# Patient Record
Sex: Male | Born: 1935 | Race: White | Hispanic: No | Marital: Married | Smoking: Former smoker
Health system: Southern US, Community
[De-identification: ages and names within clinical notes are randomized; demographics above are authoritative.]

## PROBLEM LIST (undated history)

## (undated) DIAGNOSIS — I340 Nonrheumatic mitral (valve) insufficiency: Secondary | ICD-10-CM

## (undated) DIAGNOSIS — K219 Gastro-esophageal reflux disease without esophagitis: Secondary | ICD-10-CM

## (undated) DIAGNOSIS — E785 Hyperlipidemia, unspecified: Secondary | ICD-10-CM

## (undated) DIAGNOSIS — I2510 Atherosclerotic heart disease of native coronary artery without angina pectoris: Secondary | ICD-10-CM

## (undated) DIAGNOSIS — J449 Chronic obstructive pulmonary disease, unspecified: Secondary | ICD-10-CM

## (undated) DIAGNOSIS — I10 Essential (primary) hypertension: Secondary | ICD-10-CM

## (undated) DIAGNOSIS — Q859 Phakomatosis, unspecified: Secondary | ICD-10-CM

## (undated) DIAGNOSIS — G4730 Sleep apnea, unspecified: Secondary | ICD-10-CM

## (undated) DIAGNOSIS — I951 Orthostatic hypotension: Secondary | ICD-10-CM

## (undated) DIAGNOSIS — M109 Gout, unspecified: Secondary | ICD-10-CM

## (undated) DIAGNOSIS — D649 Anemia, unspecified: Secondary | ICD-10-CM

## (undated) HISTORY — PX: VASECTOMY: SHX75

## (undated) HISTORY — DX: Atherosclerotic heart disease of native coronary artery without angina pectoris: I25.10

## (undated) HISTORY — DX: Phakomatosis, unspecified: Q85.9

## (undated) HISTORY — DX: Orthostatic hypotension: I95.1

## (undated) HISTORY — DX: Hyperlipidemia, unspecified: E78.5

## (undated) HISTORY — DX: Essential (primary) hypertension: I10

## (undated) HISTORY — DX: Anemia, unspecified: D64.9

## (undated) HISTORY — DX: Chronic obstructive pulmonary disease, unspecified: J44.9

## (undated) HISTORY — DX: Nonrheumatic mitral (valve) insufficiency: I34.0

---
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
## 1972-07-16 HISTORY — PX: LUMBAR DISC SURGERY: SHX700

## 1979-03-17 HISTORY — PX: TRIGGER FINGER RELEASE: SHX641

## 2006-07-16 HISTORY — PX: CARDIAC CATHETERIZATION: SHX172

## 2006-11-27 ENCOUNTER — Ambulatory Visit: Payer: Self-pay | Admitting: Cardiology

## 2006-12-11 ENCOUNTER — Ambulatory Visit: Payer: Self-pay | Admitting: Cardiology

## 2007-04-15 ENCOUNTER — Ambulatory Visit: Payer: Self-pay | Admitting: Cardiology

## 2007-04-23 ENCOUNTER — Ambulatory Visit: Payer: Self-pay | Admitting: Cardiology

## 2007-05-23 ENCOUNTER — Ambulatory Visit: Payer: Self-pay | Admitting: Cardiology

## 2007-06-02 ENCOUNTER — Ambulatory Visit: Payer: Self-pay | Admitting: Cardiology

## 2007-06-02 ENCOUNTER — Inpatient Hospital Stay (HOSPITAL_BASED_OUTPATIENT_CLINIC_OR_DEPARTMENT_OTHER): Admission: RE | Admit: 2007-06-02 | Discharge: 2007-06-02 | Payer: Self-pay | Admitting: Cardiology

## 2007-06-19 ENCOUNTER — Encounter: Payer: Self-pay | Admitting: Physician Assistant

## 2007-06-19 ENCOUNTER — Ambulatory Visit: Payer: Self-pay | Admitting: Cardiology

## 2007-09-11 ENCOUNTER — Ambulatory Visit: Payer: Self-pay | Admitting: Cardiology

## 2007-11-19 ENCOUNTER — Ambulatory Visit: Payer: Self-pay | Admitting: Cardiology

## 2008-06-15 ENCOUNTER — Encounter: Payer: Self-pay | Admitting: Cardiology

## 2008-06-23 DIAGNOSIS — I10 Essential (primary) hypertension: Secondary | ICD-10-CM

## 2008-06-23 DIAGNOSIS — R609 Edema, unspecified: Secondary | ICD-10-CM

## 2008-06-23 DIAGNOSIS — E785 Hyperlipidemia, unspecified: Secondary | ICD-10-CM | POA: Insufficient documentation

## 2008-10-16 ENCOUNTER — Encounter: Payer: Self-pay | Admitting: Cardiology

## 2009-02-28 ENCOUNTER — Encounter: Payer: Self-pay | Admitting: Cardiology

## 2009-02-28 ENCOUNTER — Ambulatory Visit: Payer: Self-pay | Admitting: Cardiology

## 2009-02-28 DIAGNOSIS — N182 Chronic kidney disease, stage 2 (mild): Secondary | ICD-10-CM

## 2009-02-28 DIAGNOSIS — G20 Parkinson's disease: Secondary | ICD-10-CM

## 2009-02-28 DIAGNOSIS — E739 Lactose intolerance, unspecified: Secondary | ICD-10-CM

## 2009-03-02 ENCOUNTER — Encounter: Payer: Self-pay | Admitting: Cardiology

## 2009-03-10 ENCOUNTER — Encounter (INDEPENDENT_AMBULATORY_CARE_PROVIDER_SITE_OTHER): Payer: Self-pay | Admitting: *Deleted

## 2009-08-10 ENCOUNTER — Encounter: Payer: Self-pay | Admitting: Cardiology

## 2009-09-21 ENCOUNTER — Ambulatory Visit: Payer: Self-pay | Admitting: Cardiology

## 2009-09-27 ENCOUNTER — Ambulatory Visit: Payer: Self-pay | Admitting: Cardiology

## 2009-10-05 ENCOUNTER — Encounter (INDEPENDENT_AMBULATORY_CARE_PROVIDER_SITE_OTHER): Payer: Self-pay | Admitting: *Deleted

## 2010-08-15 NOTE — Assessment & Plan Note (Signed)
Summary: 6 mo fu per feb reminder  Medications Added FISH OIL 1000 MG CAPS (OMEGA-3 FATTY ACIDS) Take 2 tablet by mouth two times a day METOPROLOL TARTRATE 25 MG TABS (METOPROLOL TARTRATE) Take 1 tablet by mouth two times a day CARBIDOPA-LEVODOPA 25-100 MG TABS (CARBIDOPA-LEVODOPA) Take 1 tablet by mouth in morning and 1/2 at midday      Allergies Added:   Visit Type:  Follow-up Primary Provider:  Dr. Margo Common  CC:  follow-up visit.  History of Present Illness: the patient is a 77 year old male with a history of Parkinson's disease.  The patient has known coronary artery disease with single-vessel disease of the LAD with hundred percent occlusion but with collaterals provided by the circumflex and right coronary artery.  The patient has normal LV function.  The patient reports occasional atypical left-sided chest pain which typically lasts less than one minute.  He reports however no exertional chest pain which is substernal in nature.  He denies any orthopnea PND or shortness of breath on exertion.  He has mild renal insufficiency his creatinine has been stable.  He also had a recent abdominal ultrasound done which showed no evidence of aortic aneurysm.  The patient reports no palpitations or syncope.  The patient stated he did not have any recent lipid panel or LFTs drawn.  He also had no recent ischemia evaluation.  Clinical Review Panels:  CXR CXR results Heart size is normal. Mediastinum is unremarkable. The lungs are         hyperinflated consistent with emphysema. There is pleural and         parenchymal scarring at the apices. No evidence of active infiltrate,         mass, effusion or collapse.                   IMPRESSION:         EMPHYSEMA. NO ACTIVE PROCESS EVIDENT. (05/23/2007)  Cardiac Imaging Cardiac Cath Findings  1. Coronary artery disease as outlined including a chronically       occluded proximal left anterior descending with some evidence of       partial  filling of the mid to distal vessel via right-to-left       collaterals.  There is otherwise mild to moderate coronary       atherosclerosis within the circumflex and right coronary artery.       There is a medium caliber ramus intermedius without significant       obstruction.   2. Left ventricular end-diastolic pressure of 10 mmHg.      DISCUSSION:  I reviewed the results with the patient and his family.  I   also discussed the case with Dr. Andee Lineman by phone.  At this point would   anticipate medical therapy and office follow-up.      Jonelle Sidle, MD (06/02/2007)    Preventive Screening-Counseling & Management  Alcohol-Tobacco     Smoking Status: quit     Year Quit: 1983  Current Medications (verified): 1)  Nitroglycerin 0.4 Mg Subl (Nitroglycerin) .... One Tablet Under Tongue Every 5 Minutes As Needed For Chest Pain---May Repeat Times Three 2)  Fish Oil 1000 Mg Caps (Omega-3 Fatty Acids) .... Take 2 Tablet By Mouth Two Times A Day 3)  Aspirin 81 Mg Tbec (Aspirin) .... Take One Tablet By Mouth Daily 4)  Doxazosin Mesylate 2 Mg Tabs (Doxazosin Mesylate) .... Once Daily 5)  Metoprolol Tartrate 25 Mg Tabs (Metoprolol Tartrate) .... Take  1 Tablet By Mouth Two Times A Day 6)  Requip 5 Mg Tabs (Ropinirole Hcl) .... Three Times A Day 7)  Simvastatin 40 Mg Tabs (Simvastatin) .... Take 1 Tab Daily 8)  Carbidopa-Levodopa 25-100 Mg Tabs (Carbidopa-Levodopa) .... Take 1 Tablet By Mouth in Morning and 1/2 At Midday  Allergies (verified): 1)  ! Sulfa  Comments:  Nurse/Medical Assistant: The patient's medications and allergies were reviewed with the patient and were updated in the Medication and Allergy Lists. Bottles reviewed.  Past History:  Past Medical History: Last updated: 06/23/2008 CAD-Chronic 100% proximal LAD with distal collateralization, treated medically, Residual moderate CFX and RCA disease Hyperlipidemia Hypertension Parkinson's Disease Mild mitral  regurgitation  Past Surgical History: Last updated: 06/23/2008 Tonsillectomy Bilateral Hand Surgeries Vasectomy  Family History: Last updated: 06/23/2008 Father:CAD, Alzheimer's Mother: Ovarian Cancer Family History of Diabetes, TIA  Social History: Last updated: 06/23/2008 Tobacco Use - Former.  Alcohol Use - no Drug Use - no  Risk Factors: Smoking Status: quit (09/21/2009)  Social History: Smoking Status:  quit  Review of Systems  The patient denies fatigue, malaise, fever, weight gain/loss, vision loss, decreased hearing, hoarseness, chest pain, palpitations, shortness of breath, prolonged cough, wheezing, sleep apnea, coughing up blood, abdominal pain, blood in stool, nausea, vomiting, diarrhea, heartburn, incontinence, blood in urine, muscle weakness, joint pain, leg swelling, rash, skin lesions, headache, fainting, dizziness, depression, anxiety, enlarged lymph nodes, easy bruising or bleeding, and environmental allergies.    Vital Signs:  Patient profile:   77 year old male Height:      72 inches Weight:      171 pounds Pulse rate:   62 / minute BP sitting:   145 / 77  (left arm) Cuff size:   regular  Vitals Entered By: Carlye Grippe (September 21, 2009 8:45 AM) CC: follow-up visit   Physical Exam  Additional Exam:  General: Well-developed, well-nourished in no distress head: Normocephalic and atraumatic eyes PERRLA/EOMI intact, conjunctiva and lids normal nose: No deformity or lesions mouth normal dentition, normal posterior pharynx neck: Supple, no JVD.  No masses, thyromegaly or abnormal cervical nodes lungs: Normal breath sounds bilaterally without wheezing.  Normal percussion heart: regular rate and rhythm with normal S1 and S2, no S3 or S4.  PMI is normal.  No pathological murmurs abdomen: Normal bowel sounds, abdomen is soft and nontender without masses, organomegaly or hernias noted.  No hepatosplenomegaly musculoskeletal: Back normal, normal gait  muscle strength and tone normal pulsus: Pulse is normal in all 4 extremities Extremities: No peripheral pitting edema neurologic: Alert and oriented x 3, shuffling gait and paucity of motor movements skin: Intact without lesions or rashes cervical nodes: No significant adenopathy psychologic: Normal affect    Impression & Recommendations:  Problem # 1:  CAD, NATIVE VESSEL (ICD-414.01) the patient has a history of coronary artery disease with an occluded LAD but with collaterals from right coronary artery circumflex coronary artery.the patient is due for ischemia testing [greater than 3 years] have ordered a LexiScan.  I think it will be hard for the patient exercise given his Parkinson's disease.  I also do not want to discontinue his beta-blocker. The following medications were removed from the medication list:    Isosorbide Mononitrate Cr 30 Mg Xr24h-tab (Isosorbide mononitrate) .[name]... Take one tablet by mouth daily His updated medication list for this problem includes:    Nitroglycerin 0.4 Mg Subl (Nitroglycerin) ..... One tablet under tongue every 5 minutes as needed for chest pain---may repeat times three  Aspirin 81 Mg Tbec (Aspirin) .[name]... Take one tablet by mouth daily    Metoprolol Tartrate 25 Mg Tabs (Metoprolol tartrate) .[name]... Take 1 tablet by mouth two times a day  Orders: Nuclear Med (Nuc Med)  Problem # 2:  HYPERTENSION (ICD-401.9) blood pressures well controlled.  Systolic is slightly elevated but he reports to me that his blood pressure readings have been normal at home. His updated medication list for this problem includes:    Aspirin 81 Mg Tbec (Aspirin) .[name]... Take one tablet by mouth daily    Doxazosin Mesylate 2 Mg Tabs (Doxazosin mesylate) ..... Once daily    Metoprolol Tartrate 25 Mg Tabs (Metoprolol tartrate) .[name]... Take 1 tablet by mouth two times a day  Problem # 3:  RENAL DISEASE, CHRONIC, MILD (ICD-585.2) the patient has mild renal sufficiency but his  creatinine has slightly improved to 1.3 the mg/dL  Problem # 4:  PARKINSON'S DISEASE (ICD-332.0) Assessment: Comment Only  Other Orders: T-Lipid Profile (47829-56213) T-Hepatic Function (08657-84696)  Patient Instructions: 1)  Labs:  Lipids & LFT's 2)  Lexiscan on meds 3)  Follow up in  1 year.   Prescriptions: METOPROLOL TARTRATE 25 MG TABS (METOPROLOL TARTRATE) Take 1 tablet by mouth two times a day  #60 x 11   Entered by:   Hoover Brunette, LPN   Authorized by:   Lewayne Bunting, MD, [facility]   Signed by:   Hoover Brunette, LPN on 29/52/8413   Method used:   Electronically to        [facility]* (retail)       [address]       [place], [place]  [address]       Ph: [phone]       Fax: [phone]   RxID:   [id number]

## 2010-08-15 NOTE — Letter (Signed)
Summary: Engineer, materials at [facility]  [address]   [place], [place] [address]   Phone: [phone]  Fax: [phone]        October 05, 2009 MRN: [id number]   Jorge Lester [address] [place], [place]  [address]   Dear Mr. RAATZ,  Your test ordered by Selena Batten has been reviewed by your physician (or physician assistant) and was found to be normal or stable. Your physician (or physician assistant) felt no changes were needed at this time.  ____ Echocardiogram  __X__ Cardiac Stress Test  __X__ Lab Work  ____ Peripheral vascular study of arms, legs or neck  ____ CT scan or X-ray  ____ Lung or Breathing test  ____ Other:   Thank you.   Hoover Brunette, LPN    Duane Boston, M.D., F.A.C.C. Thressa Sheller, M.D., F.A.C.C. Oneal Grout, M.D., F.A.C.C. Cheree Ditto, M.D., F.A.C.C. Daiva Nakayama, M.D., F.A.C.C. Kenney Houseman, M.D., F.A.C.C. Jeanne Ivan, PA-C

## 2010-10-04 ENCOUNTER — Encounter: Payer: Self-pay | Admitting: *Deleted

## 2010-10-04 ENCOUNTER — Other Ambulatory Visit: Payer: Self-pay | Admitting: Cardiology

## 2010-10-04 DIAGNOSIS — E785 Hyperlipidemia, unspecified: Secondary | ICD-10-CM

## 2010-10-04 DIAGNOSIS — I2510 Atherosclerotic heart disease of native coronary artery without angina pectoris: Secondary | ICD-10-CM

## 2010-11-03 ENCOUNTER — Ambulatory Visit (INDEPENDENT_AMBULATORY_CARE_PROVIDER_SITE_OTHER): Payer: Medicare Other | Admitting: Cardiology

## 2010-11-03 ENCOUNTER — Encounter: Payer: Self-pay | Admitting: Cardiology

## 2010-11-03 ENCOUNTER — Ambulatory Visit: Payer: Self-pay | Admitting: Cardiology

## 2010-11-03 VITALS — BP 114/65 | HR 58 | Ht 71.0 in | Wt 175.0 lb

## 2010-11-03 DIAGNOSIS — E785 Hyperlipidemia, unspecified: Secondary | ICD-10-CM

## 2010-11-03 DIAGNOSIS — I340 Nonrheumatic mitral (valve) insufficiency: Secondary | ICD-10-CM

## 2010-11-03 DIAGNOSIS — I059 Rheumatic mitral valve disease, unspecified: Secondary | ICD-10-CM

## 2010-11-03 DIAGNOSIS — I10 Essential (primary) hypertension: Secondary | ICD-10-CM

## 2010-11-03 DIAGNOSIS — I2510 Atherosclerotic heart disease of native coronary artery without angina pectoris: Secondary | ICD-10-CM

## 2010-11-03 NOTE — Patient Instructions (Signed)
Your physician you to follow up in 1 year. You will receive a reminder letter in the mail one-two months in advance. If you don't receive a letter, please call our office to schedule the follow-up appointment. Your physician recommends that you continue on your current medications as directed. Please refer to the Current Medication list given to you today. 

## 2010-11-04 ENCOUNTER — Other Ambulatory Visit: Payer: Self-pay | Admitting: Cardiology

## 2010-11-04 DIAGNOSIS — I340 Nonrheumatic mitral (valve) insufficiency: Secondary | ICD-10-CM | POA: Insufficient documentation

## 2010-11-04 NOTE — Assessment & Plan Note (Signed)
mild mitral regurgitation: No significant mitral regurgitation by physical examination.

## 2010-11-04 NOTE — Progress Notes (Signed)
HPI The patient had a nuclear stress test done in March of 2011. 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 We recommended routine followup.  The patient is a 77 year old male with a history of Parkinson's disease. He has known coronary artery disease with single-vessel disease of the LAD and 100% occlusion with collaterals provided by the circumflex and right coronary artery he has normal LV function. Patient has been doing well. He reports no chest pain or shortness of breath. He has no orthopnea PND. He has no palpitations or syncope.  Allergies  Allergen Reactions  . Sulfonamide Derivatives     Current Outpatient Prescriptions on File Prior to Visit  Medication Sig Dispense Refill  . aspirin 81 MG tablet Take 81 mg by mouth daily.        . carbidopa-levodopa (SINEMET) 25-100 MG per tablet Take 1/2 by mouth three times daily      . doxazosin (CARDURA) 2 MG tablet Take 2 mg by mouth at bedtime.        . metoprolol tartrate (LOPRESSOR) 25 MG tablet Take 25 mg by mouth 2 (two) times daily.        . nitroGLYCERIN (NITROSTAT) 0.4 MG SL tablet Place 0.4 mg under the tongue every 5 (five) minutes as needed.        . Omega-3 Fatty Acids (FISH OIL) 1000 MG CAPS Take 2 by mouth twice daily        . rOPINIRole (REQUIP) 0.5 MG tablet Take 0.5 mg by mouth 3 (three) times daily.        . simvastatin (ZOCOR) 40 MG tablet Take 40 mg by mouth at bedtime.          No past medical history on file.  No past surgical history on file.  No family history on file.  History   Social History  . Marital Status: Married    Spouse Name: N/A    Number of Children: N/A  . Years of Education: N/A   Occupational History  . Not on file.   Social History Main Topics  . Smoking status: Former Smoker    Quit date: 07/16/1989  . Smokeless tobacco: Not on file  . Alcohol Use: Not on file  . Drug Use: Not on file  . Sexually Active: Not on file   Other Topics Concern  .  Not on file   Social History Narrative  . No narrative on file  YNW:GNFAOZHYQ positives as outlined above. The remainder of the 18  point review of systems is negative   PHYSICAL EXAM BP 114/65  Pulse 58  Ht 5\' 11"  (1.803 m)  Wt 175 lb (79.379 kg)  BMI 24.41 kg/m2   ECG:No acute ischemic changes. Stable  ASSESSMENT AND PLAN

## 2010-11-04 NOTE — Assessment & Plan Note (Signed)
Hypertension: Controlled

## 2010-11-04 NOTE — Assessment & Plan Note (Signed)
Stress test one year ago no ischemia. No further work- up

## 2010-11-07 ENCOUNTER — Other Ambulatory Visit: Payer: Self-pay | Admitting: *Deleted

## 2010-11-07 MED ORDER — SIMVASTATIN 40 MG PO TABS: 40.0000 mg | ORAL_TABLET | Freq: Every day | ORAL | Status: DC

## 2010-11-28 NOTE — Assessment & Plan Note (Signed)
[facility]                          [facility] OFFICE NOTE   NAME:Jorge Lester, Jorge Lester                     MRN:          [id number]  DATE:05/23/2007                            DOB:          10-21-1933    REFERRING PHYSICIAN:  Wyvonnia Lora   HISTORY OF PRESENT ILLNESS:  The patient is a 77 year old male with a  history of dyspnea.  The patient was initially evaluated by Dr. Antoine Poche  on April 15, 2007.  Dr. Antoine Poche got a BNP level which was  essentially within normal limits.  He also ordered a stress perfusion  study to rule out the possibility of coronary artery disease as an  etiology for his underlying dyspnea.  The study demonstrated an ejection  fraction of 53%.  There also appeared to be abnormal LV perfusion.  The  patient did report dyspnea during exercise testing and there was 1-mm ST  segment depression which resolved 5 minutes into the recovery phase.  There was also a __________  fixed apical at the mid anterior septal  defect.  There was a second small fixed basal inferior defect.  The  patient also had an echocardiographic study done previously on Dec 11, 2006, which demonstrated diastolic dysfunction with an ejection fraction  of 50-55%.  No other significant valvular abnormalities were noted.  During the initial visit to Dr. Antoine Poche the patient also complained of  significant orthostasis.  This appears to have resolved.   The patient denies any substernal chest pain.  He also reports no  definite orthopnea, PND.  He has no palpitations or syncope.  The  patient returns to the office today to discuss the results of his  Cardiolite stress study ordered by Dr. Antoine Poche.   MEDICATIONS:  1. Aspirin 81 mg p.o. daily.  2. Amantadine 100 mg p.o. b.i.d.  3. Doxazosin 2 mg p.o. daily.  4. Requip 5 mg p.o. t.i.d.  5. Triamterene/hydrochlorothiazide 25 mg p.o. daily.   PHYSICAL EXAMINATION:  VITAL SIGNS:  Blood pressure is 146/74,  heart  rate 74 beats per minute, weight is 155 pounds.  HEENT:  Normal.  NECK:  No JVD.  Normal carotid upstroke.  No thyromegaly.  LUNGS:  Clear breath sounds bilaterally.  HEART:  Regular rate and rhythm with normal S1 S2.  No murmurs, rubs, or  gallops.  LUNGS:  Clear.  ABDOMEN:  Soft, nontender with no rebound or guarding.  Good bowel  sounds.  SKIN:  No rash or lesion.  EXTREMITIES:  With 2 plus peripheral pulses with no edema.  NEUROLOGIC:  The patient is alert, oriented, and grossly nonfocal.   PROBLEM LIST:  1. Dyspnea rule out ischemic heart disease.  2. Abnormal Cardiolite perfusion study, see details above.  3. Dizziness and orthostasis, resolved.  4. History of Parkinson disease.  5. History of tobacco use, quit 25 years ago.   PLAN:  1. The patient does have significant dyspnea of unclear etiology.      Given the abnormal Cardiolite stress study and his increased      pretest probability, I have offered the patient to proceed  with      cardiac catheterization.  We discussed the risks and benefits and      he is willing to proceed and this will be done in the [facility]      catheterization laboratory.  2. Currently the patient's orthostasis is grossly resolved and I do      not think we need to stop his Requip.  I did mention to him that he      is also on vasodilators including doxazosin and Requip all which      can give orthostasis.  3. The patient can follow up after the catheterization in our office.     Learta Codding, MD,FACC  Electronically Signed    GED/MedQ  DD: 05/25/2007  DT: 05/25/2007  Job #: [id number]   cc:   Wyvonnia Lora

## 2010-11-28 NOTE — Cardiovascular Report (Signed)
NAME:  Jorge Lester, Jorge Lester NO.:  [id number]   MEDICAL RECORD NO.:  [id number]          PATIENT TYPE:  OIB   LOCATION:  1966                         FACILITY:  [facility]   PHYSICIAN:  Jonelle Sidle, MD DATE OF BIRTH:  [birth year]   DATE OF PROCEDURE:  DATE OF DISCHARGE:                            CARDIAC CATHETERIZATION   PRIMARY CARE PHYSICIAN:  Dr. Wyvonnia Lora   PRIMARY CARDIOLOGIST:  Learta Codding, MD,FACC   INDICATIONS:  Jorge Lester is a pleasant 77 year old male with a history  of Parkinson disease, remote tobacco use, resolved orthostatic dizziness  and longer standing dyspnea on exertion.  He was referred recently for a  Cardiolite back in September which revealed abnormal ST-segment changes  but otherwise fixed defects including the anterior wall which seemed to  be consistent with scar and the inferior wall which was more consistent  with attenuation artifact.  His ejection fraction was 53% at that time.  He is referred now for diagnostic cardiac catheterization for clear  definition of coronary anatomy and to assess for any potential  revascularizations strategies.  Pre procedure laboratory data showed a  creatinine of 1.5 following hydration and coronary angiography is  requested alone without left ventriculography.  The potential risks and  benefits were explained to the patient in advance and informed consent  was obtained.   PROCEDURES PERFORMED:  1. Left heart catheterization.  2. Selective coronary angiography.   ACCESS AND EQUIPMENT:  The area about the right femoral artery was  anesthetized with 1% lidocaine.  A 4-French sheath was placed in the  right femoral artery via modified Seldinger technique.  Standard  preformed JL-5 and 3-D RC catheters were used for selective coronary  geography.  An angled pigtail catheter was used for left heart  catheterization.  All exchanges were made over a wire.  Total of 70 mL  Omnipaque were used.  The  patient tolerated the procedure well without  any complications.   HEMODYNAMIC RESULTS:  Aorta 138/60 mmHg.  Left ventricle 138/10 mmHg.   ANGIOGRAPHIC FINDINGS:  1. The left main coronary artery gives rise to the left anterior      descending, a relatively large ramus intermedius and circumflex      vessels.  There is no significant flow-limiting coronary      atherosclerosis noted.  2. Left anterior descending is medium in caliber, based on limited      visualization.  The vessel is occluded proximally.  Following an      ostial stenosis of 50% and diffuse 75% stenosis prior to the      occlusion.  The portion of the mid and distal vessel is seen to      fill on injection of the right coronary artery via right-to-left      collaterals.  This occlusion is consistent with a chronic      occlusion.  3. There is a relatively large ramus intermedius coursing towards the      apex.  There are minor luminal irregularities without flow-limiting      stenosis.  4. The circumflex coronary artery is  medium in caliber with a very      small AV groove branch and larger branching obtuse marginal.  There      is approximately 40-50% midvessel stenosis noted.  5. The right coronary artery is a large dominant vessel with large      posterior descending branch.  There is diffuse disease noted      including approximately 30% in the proximal portion of the vessel      and 40-50% in the midportion of the vessel.  There are collaterals      extending from the posterior descending branch to the left anterior      descending.  There is only partial filling seen of the left      anterior descending in the mid distal vessel.   DIAGNOSES:  1. Coronary artery disease as outlined including a chronically      occluded proximal left anterior descending with some evidence of      partial filling of the mid to distal vessel via right-to-left      collaterals.  There is otherwise mild to moderate coronary       atherosclerosis within the circumflex and right coronary artery.      There is a medium caliber ramus intermedius without significant      obstruction.  2. Left ventricular end-diastolic pressure of 10 mmHg.   DISCUSSION:  I reviewed the results with the patient and his family.  I  also discussed the case with Dr. Andee Lineman by phone.  At this point would  anticipate medical therapy and office follow-up.      Jonelle Sidle, MD  Electronically Signed     SGM/MEDQ  D:  06/02/2007  T:  06/03/2007  Job:  [id number]   cc:   Ellyn Hack, MD,FACC

## 2010-11-28 NOTE — Assessment & Plan Note (Signed)
[facility]                          [facility] NOTE   NAME:Jorge Lester, Jorge Lester                     MRN:          [id number]  DATE:09/11/2007                            DOB:          1934-02-23    REFERRING PHYSICIAN:  Wyvonnia Lora   HISTORY OF PRESENT ILLNESS:  The patient is a 77 year old male patient  with a history of coronary artery disease.  This patient is status post  catheterization.  He was found to have an occluded LAD with right to  left collaterals.  He has been doing quite well, however, with no  substernal chest pain.  Medical therapy with beta-blocker and  simvastatin was initiated.  The patient denies any chest pain, shortness  of breath, orthopnea, PND.  Has no palpitations or syncope.   MEDICATIONS:  1. Aspirin 81 mg p.o. daily.  2. Amantadine 100 mg p.o. b.i.d.  3. Doxazosin 2 mg p.o. daily.  4. Requip 5 mg p.o. t.i.d.  5. Simvastatin 20 mg p.o. q.h.s.  6. Metoprolol 25 mg p.o. b.i.d.  7. Fish oil 1000 mg p.o. b.i.d.   PHYSICAL EXAMINATION:  VITAL SIGNS:  Blood pressure is 148/78, heart  rate 85 beats per minute, weight 172 pounds.  NECK:  Normal carotid upstroke and no carotid bruits.  LUNGS:  Clear breath sounds bilaterally.  HEART:  Regular rate and rhythm.  Normal sinus rhythm, no murmurs,  gallops, rubs. .  ABDOMEN:  Soft, nontender, no rebound or guarding.  Good bowel sounds.  EXTREMITIES:  No cyanosis, clubbing or edema.   PROBLEMS:  1. Dyspnea, stable.  2. Coronary artery disease.      a.     Occluded proximal LAD with a right to left collaterals,       medical therapy per Dr. Diona Browner.  3. Normal LV function.  4. Diastolic dysfunction by echo May 2000.  5. History of __________.  6. Parkinson's disease.  7. _________.  8. Hypertension.   PLAN:  1. The patient is hypertensive today and we did add his      triamterene/hydrochlorothiazide back to the medical regimen      provided this will not  cause any further orthostatic symptoms.  He      also reports some edema in the legs, and therefore I have      reinitiated his therapy.  2. From cardiovascular perspective, the patient is stable with no      evidence of angina, and we continue him on beta blocker and statin      therapy as well as aspirin.Learta Codding, MD,FACC  Electronically Signed    GED/MedQ  DD: 09/12/2007  DT: 09/13/2007  Job #: [id number]   cc:   Wyvonnia Lora

## 2010-11-28 NOTE — Assessment & Plan Note (Signed)
[facility]                          [facility]   NAME:Jorge Lester                     MRN:          [id number]  DATE:06/19/2007                            DOB:          18-Jan-1934    PRIMARY CARDIOLOGIST:  Learta Codding, MD, [facility]   PRIMARY CARE PHYSICIAN:  Wyvonnia Lora, MD   REASON FOR VISIT:  Post catheterization followup.   HISTORY OF PRESENT ILLNESS:  [name] is a 77 year old male patient  who returns to the office today for post catheterization followup. He  has been seen in our clinic on three occasions with dyspnea and had an  abnormal Cardiolite study suggesting ischemic heart disease. Cardiac  catheterization was done by Dr. Diona Browner on November 18,2008 and  revealed coronary artery disease with a 50% ostial and 75% diffuse  stenosis prior to a total occlusion proximally of the LAD. There was  right to left collaterals coming from the PDA. There was also a 40-50%  mid circumflex lesion and a 30% proximal and 40% mid RCA lesion. His  ejection fraction was previously noted to be normal by echocardiogram in  May of 2008.   The patient returns to the office today for followup. He notes that he  is doing about the same. His dyspnea has really not changed. He has mild  dyspnea on exertion and describes NYHA Class II symptoms. He will  occasionally become short of breath at rest. He denies orthopnea or PND.  Denies any pedal edema. Denies any syncope or chest pain.   CURRENT MEDICATIONS:  1. Aspirin 81 mg daily.  2. Amantidine 100 mg b.i.d.  3. Doxazosin 2 mg daily.  4. Requip 5 mg three times a day.  5. Triamterene/hydrochlorothiazide 25 mg daily.  6. Nitroglycerin p.r.n. chest pain.   ALLERGIES:  SULFA.   PHYSICAL EXAMINATION:  He is a well-nourished, well-developed male in no  acute distress. Blood pressure 149/85, pulse 86, weight 159.6 pounds.  HEENT: Is normal.  NECK: Without JVD.  CARDIAC: Normal S1, S2,  regular rate and rhythm.  LUNGS:  Are clear to auscultation bilaterally.  ABDOMEN: Soft and nontender with normoactive bowel sounds. No  organomegaly.  EXTREMITIES: Without edema. Right femoral arteriotomy site without  hematoma or bruit.  NEUROLOGIC: He is alert and oriented x3. Cranial nerves II-XII are  grossly intact. Resting tremor noted.   IMPRESSION:  1. Dyspnea.      a.     Questionable anginal equivalent.  2. Coronary artery disease as outlined above with a totally occluded      proximal left anterior descending artery with right to left      collaterals.  3. Good left ventricular function.  4. Diastolic dysfunction by echocardiogram in May of 2008.  5. History of orthostasis, resolved.  6. Parkinson's disease.  7. Ex-smoker.  8. Hypertension.   PLAN:  The patient presents to the office today for post catheterization  followup. As noted above, he does have significant coronary disease with  a totally occluded left anterior descending artery and right to left  collaterals. He continues to have  dyspnea on exertion without much  change. It is certainly possible that his dyspnea could be an anginal  equivalent. I had a long discussion with him today regarding treatment  of coronary artery disease. We discussed benefits with treatment with a  statin as well as trial of a beta-blocker as an antianginal. He is  somewhat reluctant to start on new medications, but is willing to try.  Therefore at this point in time I plan to:  1. Initiate metoprolol 25 mg b.i.d.  2. Initiate simvastatin 20 mg q nightly.  3. Check lipids and LFTs in 12 weeks.  4. He will hold his triamterene and hydrochlorothiazide for now.  5. Followup in three months or sooner p.r.n. If he has difficulty      tolerating the metoprolol he can switch back to his triamterene. If      his blood pressure is still suboptimally controlled on metoprolol      alone then we can add back his triamterene/  hydrochlorothiazide.      Tereso Newcomer, PA-C  Electronically Signed      Learta Codding, MD,FACC  Electronically Signed   SW/MedQ  DD: 06/19/2007  DT: 06/19/2007  Job #: [id number]   cc:   Wyvonnia Lora

## 2010-11-28 NOTE — Assessment & Plan Note (Signed)
[facility]                          [facility]   NAME:Jorge Lester, Jorge Lester                     MRN:          [id number]  DATE:11/27/2006                            DOB:          18-Nov-1933    [name] is new to the [facility].  He was referred here  by Dr. Loney Hering.  His primary care physician is Dr. Margo Common.  Jorge Lester is  a very pleasant 77 year old Caucasian gentleman referred here for  shortness of breath with bilateral leg weakness.  Jorge Lester has a  history of Parkinson's disease and mild hypertension.  He states up to  two years ago, he was previously walking three miles a day without any  problems.  Continued to work part-time.  He is married.  He is  accompanied by his wife today.  Previously worked for [employer] for 29  years.  Now works part-time at [employer].  Today he complains  of shortness of breath.  Apparently was evaluated by Dr. Loney Hering for this  in April.  Chest x-ray at that time showed COPD without acute  cardiopulmonary findings.  Jorge Lester complains of intermittent  shortness of breath that occurs usually after getting up and moving  around.  The most extreme episode occurred after walking to the mailbox.  A few days ago he became very short of breath and experienced some  nausea also.  He complains of increased insomnia.  Denies any episodes  of chest discomfort.  Shortness of breath has no consistent pattern.  He  states he can go for a week at a time and not have any shortness of  breath, and then he will have three episodes the following week.  It  does not always occur with exertion nor is it is accompanied by any  associated symptoms.  He does not get lightheaded or dizzy.  Does not  have any discomfort in his lower extremities, no palpitations or chest  pain or pressure or heaviness.  Jorge Lester also complains of weight  loss.  Apparently two years ago he felt to be overweight and  began  walking three miles daily.  He lost about 20 pounds and was pleased with  his weight.  Since that time, however, he has gone from 195 pounds down  to 150 pounds with unintentional weight loss.  He states his appetite is  good.  He denies any nausea or vomiting, no diarrhea, no abdominal  discomfort.  Does not understand why he is losing weight now.  He feels  that he eats the same amount as he always has.   PAST MEDICAL HISTORY:  1. Mild hypertension.  2. Parkinson's disease.  3. Tonsillectomy.  4. Bilateral hand surgeries.  5. Status post vasectomy.  6. Previous tobacco use, 60 pack-year history.  He quit 25 years ago.  7. Status post colonoscopy with polyps.   ALLERGIES:  SULFA.   CURRENT MEDICATIONS:  1. Requip 5 mg b.i.d.  2. Aspirin 81 mg daily.  3. Amantadine 100 mg b.i.d.  4. Doxazosin 2 mg daily.  5. Levitra p.r.n.  SOCIAL HISTORY:  Father deceased at age 60 years old secondary to  coronary artery disease and Alzheimer's.  Mother deceased at age 89  secondary to ovarian cancer.  [name] quit smoking approximately 25  years ago.  No alcohol use, no drug use.  He is married.  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  Previously walked three miles a day.  Has no routine exercise  program at this time.   REVIEW OF SYSTEMS:  As stated above.   PHYSICAL EXAMINATION:  VITAL SIGNS:  Weight 158.4 pounds, pulse 73,  blood pressure 150/90.  A 12-lead EKG today showed normal sinus rhythm  at a rate of 69.  GENERAL APPEARANCE:  Jorge Lester is in no acute distress.  He is a thin  77 year old Caucasian gentleman alert and oriented x3.  SKIN:  Warm and dry.  HEENT:  Unremarkable.  NECK:  Supple without lymphadenopathy, no bruits, no JVD.  CARDIOVASCULAR:  Reveals S1, S2, regular rate and rhythm without  murmurs, rubs or gallops.  LUNGS:  Clear to auscultation bilaterally.  ABDOMEN:  Soft, nontender, positive  bowel sounds.  EXTREMITIES:  Without clubbing, cyanosis or edema.  NEUROLOGICAL:  The patient alert and oriented.  He has a mild tremor  most likely secondary to his Parkinson's disease.   IMPRESSION:  Intermittent episodes of dyspnea.  No clear etiology at  this time.  I have recommended the patient proceed with echocardiogram,  pulmonary function test to begin ruling out underlying causes.  I have  also asked patient to follow up with Dr. Margo Common or Dr. Loney Hering concerning  his unexplained weight loss.  I have recommended patient begin  supplementing his diet with Ensure or Boost at least two cans a day.  Continue current medications.  We will see patient back in followup  after his pulmonary function test and echocardiogram for further  evaluation.  I have discussed with Dr. Andee Lineman patient's case.  He is in  agreement with plan.     Dorian Pod, ACNP       Learta Codding, MD,FACC    MB/MedQ  DD: 11/27/2006  DT: 11/27/2006  Job #: [id number]   cc:   Loney Hering, M.D.  Wyvonnia Lora

## 2010-11-28 NOTE — Assessment & Plan Note (Signed)
[facility]                          [facility] NOTE   NAME:Jorge Lester, Jorge Lester                     MRN:          [id number]  DATE:11/19/2007                            DOB:          March 14, 1934    PRIMARY CARDIOLOGIST:  Learta Codding, MD,FACC.   REASON FOR VISIT:  Scheduled follow-up.   Jorge Lester continues to report no symptoms of exertional angina  pectoris.  He has known CAD with a chronically occluded proximal LAD,  treated medically, with moderate residual CFX and RCA disease.  He has  history of normal LVF by prior 2-D echocardiography, with some evidence  of mild mitral regurgitation.   When patient was last seen here, he was started on Maxzide, per Dr.  Andee Lineman, for management of both uncontrolled hypertension as well as some  lower extremity edema.  He tells me today that he took this for  approximately one month, but then stopped after the edema had resolved.   A recent lipid profile reveals a total cholesterol of 131, triglyceride  41, HDL 36 and LDL 87.  Liver enzymes are normal.   CURRENT MEDICATIONS:  1. Aspirin 81 daily.  2. Amantadine 100 b.i.d.  3. Doxazosin two daily.  4. Simvastatin 20 nightly.  5. Fish oil 1000 b.i.d.  6. Metoprolol 25 b.i.d.  7. Requip 5 b.i.d.   PHYSICAL EXAMINATION:  VITAL SIGNS:  Blood pressure 129/71, pulse 61 and  regular, weight 166.  GENERAL:  A 77 year old male, sitting upright, in no distress.  HEENT:  Normocephalic, atraumatic.  NECK:  Palpable carotid pulses without bruits; no JVD.  LUNGS:  Clear to auscultation in all fields.  HEART:  Regular rate and rhythm (S1 and S2), no significant murmurs.  No  rubs.  ABDOMEN:  Soft, nontender, intact bowel sounds.  EXTREMITIES:  He has 1+ bilateral nonpitting edema.  NEURO:  No focal deficit.   IMPRESSION:  1. Multivessel coronary artery disease.      a.     Chronic 100% proximal LAD with distal collateralization,       treated  medically, November 2008.      b.     Residual moderate CFX and RCA disease.  2. History of preserved left ventricular function.      a.     Mild mitral regurgitation.  3. Hypertension, well-controlled.  4. Dyslipidemia.  5. Remote tobacco.  6. Parkinson's disease.   PLAN:  1. Up-titrate simvastatin to 40 mg nightly, for more aggressive lipid      management with target LDL of 70, or less.  2. Follow-up FLP/LFTs in 12 weeks.  3. Schedule return clinic follow-up with Dr. Andee Lineman in one year.      Gene Serpe, PA-C  Electronically Signed      Learta Codding, MD,FACC  Electronically Signed   GS/MedQ  DD: 11/19/2007  DT: 11/19/2007  Job #: [id number]   cc:   Wyvonnia Lora

## 2010-11-28 NOTE — Assessment & Plan Note (Signed)
[facility]                          [facility] NOTE   NAME:Jorge Lester, Jorge Lester                     MRN:          [id number]  DATE:04/15/2007                            DOB:          1934-04-21    PRIMARY:  Dr. Margo Common.   REASON FOR PRESENTATION:  Evaluate the patient for dyspnea.   HISTORY OF PRESENT ILLNESS:  The patient presents for followup of  dyspnea.  He was seen in this office in May.  He had dyspnea at that  point and had an echocardiogram, which suggested some diastolic  dysfunction but with a well-preserved ejection fraction with no  significant valvular abnormalities.  He did have pulmonary function  testing, which did not suggest any significant disease, though there was  some small airway obstructive disease.   The patient continues to have dyspnea.  This happens with activity, such  as climbing a flight of stairs.  He is able to walk a mile-and-a-half,  but this takes him longer than he thinks it should.  He will get  dyspneic up the hills.  He does not have any chest discomfort, neck  discomfort, arm discomfort, activity-induced nausea, vomiting, or  excessive diaphoresis.  He did have an episode of pre-syncope.  He has  not had any syncopal episodes.   PAST MEDICAL HISTORY:  Mild hypertension.  Parkinson's disease.  Tonsillectomy.  Bilateral hand surgeries.  Vasectomy.  Previous tobacco  abuse (quitting 25 years ago).  Colonoscopy with polyps resected.   ALLERGIES:  SULFA.   MEDICATIONS:  1. Aspirin 81 mg daily.  2. Amantadine 100 mg b.i.d.  3. Doxazosin 2 mg daily.  4. Requip 5 mg t.i.d.  5. Fluid pill (the patient does not recall the dose).   REVIEW OF SYSTEMS:  As stated in the HPI and otherwise negative for  other systems.   PHYSICAL EXAM:  The patient is thin, but in no distress.  Blood pressure 129/82, heart rate is 76 and regular, weight 153 pounds.  HEENT:  Eyelids unremarkable.  Pupils are equal, round,  and reactive to  light and accommodation.  Fundi are not visualized.  Oral mucosa  unremarkable.  NECK:  No jugular venous distension at 45 degrees, carotid upstroke  brisk and symmetric, no bruits, thyromegaly.  LYMPHATICS:  No cervical, axillary, or inguinal adenopathy.  LUNGS:  Clear to auscultation bilaterally.  BACK:  No costovertebral angle tenderness.  CHEST:  Unremarkable.  HEART:  PMI not displaced or sustained, S1 and S2 within normal limits,  no S3, no S4, no clicks, rubs, murmurs.  ABDOMEN:  Flat, positive bowel sounds, normal in frequency and pitch, no  bruits, rebound, guarding.  No midline pulsatile masses, hepatomegaly,  splenomegaly.  SKIN:  No rashes, no nodules.  EXTREMITIES:  With 2+ pulses throughout, no edema, cyanosis, clubbing.  NEURO:  Oriented to person, place, and time, cranial nerves 2-12 grossly  intact, motor grossly intact.   ASSESSMENT AND PLAN:  1. Dyspnea.  The etiology of the patient's dyspnea is not entirely      clear.  At this point, I am going to get  a BNP level.  I am also      going to order a stress perfusion study to evaluate the possibility      of obstructive coronary disease as an etiology.  He is going to      call back with his diuretic dose.  Pending these results, he may      need a higher dose of diuretic.  2. Dizziness.  The patient had an episode of pre-syncope.  He does      have some orthostatic blood pressure drop (136/85 lying down,      109/74 standing).  He will be counseled on avoidance measures.      This may complicate diuretic management.  He may benefit from      compression stockings.  3. Followup will be back in this clinic in about 6 weeks or sooner.     Rollene Rotunda, MD, [facility]  Electronically Signed    JH/MedQ  DD: 04/15/2007  DT: 04/15/2007  Job #: [id number]   cc:   Wyvonnia Lora

## 2011-01-09 ENCOUNTER — Encounter: Payer: Self-pay | Admitting: Cardiology

## 2011-06-01 ENCOUNTER — Other Ambulatory Visit: Payer: Self-pay | Admitting: Cardiology

## 2011-06-06 ENCOUNTER — Other Ambulatory Visit: Payer: Self-pay | Admitting: *Deleted

## 2011-06-06 MED ORDER — SIMVASTATIN 40 MG PO TABS: 40.0000 mg | ORAL_TABLET | Freq: Every day | ORAL | Status: DC

## 2011-10-31 ENCOUNTER — Other Ambulatory Visit: Payer: Self-pay | Admitting: Family Medicine

## 2011-11-20 ENCOUNTER — Ambulatory Visit: Payer: Medicare Other | Admitting: Cardiology

## 2011-12-03 ENCOUNTER — Other Ambulatory Visit: Payer: Self-pay | Admitting: *Deleted

## 2011-12-03 MED ORDER — SIMVASTATIN 40 MG PO TABS: 40.0000 mg | ORAL_TABLET | Freq: Every day | ORAL | Status: DC

## 2011-12-27 ENCOUNTER — Other Ambulatory Visit: Payer: Self-pay | Admitting: Cardiology

## 2012-01-10 ENCOUNTER — Other Ambulatory Visit: Payer: Self-pay | Admitting: Cardiology

## 2012-01-18 ENCOUNTER — Ambulatory Visit: Payer: Medicare Other | Admitting: Cardiology

## 2012-02-12 ENCOUNTER — Other Ambulatory Visit: Payer: Self-pay | Admitting: *Deleted

## 2012-02-12 MED ORDER — METOPROLOL TARTRATE 25 MG PO TABS: 25.0000 mg | ORAL_TABLET | Freq: Two times a day (BID) | ORAL | Status: DC

## 2012-03-04 ENCOUNTER — Other Ambulatory Visit: Payer: Self-pay | Admitting: *Deleted

## 2012-03-04 MED ORDER — SIMVASTATIN 40 MG PO TABS: 40.0000 mg | ORAL_TABLET | Freq: Every day | ORAL | Status: DC

## 2012-03-11 ENCOUNTER — Other Ambulatory Visit: Payer: Self-pay | Admitting: Cardiology

## 2012-03-27 ENCOUNTER — Other Ambulatory Visit: Payer: Self-pay | Admitting: Cardiology

## 2012-03-27 MED ORDER — METOPROLOL TARTRATE 25 MG PO TABS: 25.0000 mg | ORAL_TABLET | Freq: Two times a day (BID) | ORAL | Status: DC

## 2012-03-28 ENCOUNTER — Encounter: Payer: Self-pay | Admitting: Cardiology

## 2012-03-28 ENCOUNTER — Ambulatory Visit (INDEPENDENT_AMBULATORY_CARE_PROVIDER_SITE_OTHER): Payer: Medicare Other | Admitting: Cardiology

## 2012-03-28 VITALS — BP 138/78 | HR 61 | Ht 72.0 in | Wt 174.1 lb

## 2012-03-28 DIAGNOSIS — I059 Rheumatic mitral valve disease, unspecified: Secondary | ICD-10-CM

## 2012-03-28 DIAGNOSIS — I10 Essential (primary) hypertension: Secondary | ICD-10-CM

## 2012-03-28 DIAGNOSIS — E785 Hyperlipidemia, unspecified: Secondary | ICD-10-CM

## 2012-03-28 DIAGNOSIS — I2510 Atherosclerotic heart disease of native coronary artery without angina pectoris: Secondary | ICD-10-CM

## 2012-03-28 DIAGNOSIS — I340 Nonrheumatic mitral (valve) insufficiency: Secondary | ICD-10-CM

## 2012-03-28 NOTE — Progress Notes (Signed)
HPI The patient presents for followup of his known or tarry disease.  Since he was last seen he has done well.  The patient denies any new symptoms such as chest discomfort, neck or arm discomfort. There has been no new shortness of breath, PND or orthopnea. There have been no reported palpitations, presyncope or syncope.  He is not walking but does some yard work.  Allergies  Allergen Reactions  . Sulfonamide Derivatives     Current Outpatient Prescriptions  Medication Sig Dispense Refill  . aspirin 81 MG tablet Take 81 mg by mouth daily.        . carbidopa-levodopa (SINEMET CR) 50-200 MG per tablet Take 1 tablet by mouth 3 (three) times daily.      [name] doxazosin (CARDURA) 2 MG tablet Take 2 mg by mouth at bedtime.        . metoprolol tartrate (LOPRESSOR) 25 MG tablet TAKE 1 TABLET BY MOUTH TWICE DAILY. PATIENT NEEDS TO BE SEEN.  30 tablet  0  . nitroGLYCERIN (NITROSTAT) 0.4 MG SL tablet Place 0.4 mg under the tongue every 5 (five) minutes as needed.        [name] omeprazole (PRILOSEC) 20 MG capsule Take 20 mg by mouth daily.      . ropinirole (REQUIP) 5 MG tablet Take 5 mg by mouth 3 (three) times daily.      . simvastatin (ZOCOR) 40 MG tablet Take 1 tablet (40 mg total) by mouth at bedtime.  30 tablet  0    Past Medical History  Diagnosis Date  . CAD (coronary artery disease)     Chronic 100% proximal LAD with distal collateralization, treated medically, residual moderate CFX ad RCA disease  . Hyperlipidemia   . Hypertension   . Parkinson's disease   . Mitral regurgitation     Mild    Past Surgical History  Procedure Date  . Tonsillectomy   . Hand surgery     Bilateral  . Vasectomy     ROS:  As stated in the HPI and negative for all other systems.  PHYSICAL EXAM BP 138/78  Pulse 61  Ht 6' (1.829 m)  Wt 174 lb 1.9 oz (78.98 kg)  BMI 23.61 kg/m2 GENERAL:  Well appearing HEENT:  Pupils equal round and reactive, fundi not visualized, oral mucosa unremarkable NECK:  No  jugular venous distention, waveform within normal limits, carotid upstroke brisk and symmetric, no bruits, no thyromegaly LYMPHATICS:  No cervical, inguinal adenopathy LUNGS:  Clear to auscultation bilaterally BACK:  No CVA tenderness CHEST:  Unremarkable HEART:  PMI not displaced or sustained,S1 and S2 within normal limits, no S3, no S4, no clicks, no rubs, no murmurs ABD:  Flat, positive bowel sounds normal in frequency in pitch, no bruits, no rebound, no guarding, no midline pulsatile mass, no hepatomegaly, no splenomegaly EXT:  2 plus pulses throughout, no edema, no cyanosis no clubbing SKIN:  No rashes no nodules NEURO:  Cranial nerves II through XII grossly intact, motor grossly intact throughout, mild tremor PSYCH:  Cognitively intact, oriented to person place and time  EKG:  Sinus rhythm, rate 61, axis within normal limits, intervals within normal limits, no acute ST-T wave changes.  Old anteroseptal infarct. 03/28/2012   ASSESSMENT AND PLAN  CAD, NATIVE VESSEL -  He has had no new symptoms since a stress test in 2011. However, he never really had angina. I will follow him with periodic stress testing most likely doing another perfusion study next year. Until  then he will continue with risk reduction.   HYPERTENSION -  The blood pressure is at target. No change in medications is indicated. We will continue with therapeutic lifestyle changes (TLC).   Mitral regurgitation -  This was apparently mild. I will follow this clinically.   Hyperlipidemia  -  His LDL is 85 with an HDL of 44 recently. This is followed by  TAPPER,DAVID B, MD and I will defer his management. This is at target.

## 2012-03-28 NOTE — Patient Instructions (Addendum)

## 2012-04-04 ENCOUNTER — Ambulatory Visit: Payer: Medicare Other | Admitting: Cardiology

## 2012-04-10 ENCOUNTER — Other Ambulatory Visit: Payer: Self-pay | Admitting: Cardiology

## 2012-07-07 ENCOUNTER — Other Ambulatory Visit: Payer: Self-pay | Admitting: Cardiology

## 2012-07-07 MED ORDER — SIMVASTATIN 40 MG PO TABS: 40.0000 mg | ORAL_TABLET | Freq: Every day | ORAL | Status: DC

## 2012-07-22 DIAGNOSIS — G4700 Insomnia, unspecified: Secondary | ICD-10-CM | POA: Insufficient documentation

## 2012-07-22 DIAGNOSIS — R269 Unspecified abnormalities of gait and mobility: Secondary | ICD-10-CM | POA: Insufficient documentation

## 2012-07-25 ENCOUNTER — Other Ambulatory Visit: Payer: Self-pay | Admitting: Neurological Surgery

## 2012-07-25 DIAGNOSIS — M549 Dorsalgia, unspecified: Secondary | ICD-10-CM

## 2012-08-21 ENCOUNTER — Telehealth: Payer: Self-pay | Admitting: Cardiology

## 2012-08-21 ENCOUNTER — Other Ambulatory Visit: Payer: Self-pay | Admitting: Cardiology

## 2012-08-21 MED ORDER — METOPROLOL TARTRATE 25 MG PO TABS: 25.0000 mg | ORAL_TABLET | Freq: Two times a day (BID) | ORAL | Status: DC

## 2012-08-21 NOTE — Telephone Encounter (Signed)
Patient would like more than 30 pills at a time of refill states that he takes pill 2 times daily and has to refill twice a month metoprolol tartrate (LOPRESSOR) 25 MG tablet

## 2012-09-05 ENCOUNTER — Telehealth: Payer: Self-pay | Admitting: Physician Assistant

## 2012-09-05 MED ORDER — SIMVASTATIN 40 MG PO TABS: 40.0000 mg | ORAL_TABLET | Freq: Every day | ORAL | Status: DC

## 2012-09-05 NOTE — Telephone Encounter (Signed)
[facility]  Simvastatin 40mg 

## 2012-11-13 ENCOUNTER — Encounter: Payer: Self-pay | Admitting: *Deleted

## 2012-11-13 ENCOUNTER — Ambulatory Visit: Payer: Medicare Other | Admitting: Physician Assistant

## 2012-11-13 ENCOUNTER — Ambulatory Visit (INDEPENDENT_AMBULATORY_CARE_PROVIDER_SITE_OTHER): Payer: Medicare Other | Admitting: Physician Assistant

## 2012-11-13 ENCOUNTER — Encounter: Payer: Self-pay | Admitting: Physician Assistant

## 2012-11-13 VITALS — BP 104/54 | HR 61 | Ht 72.0 in | Wt 174.0 lb

## 2012-11-13 DIAGNOSIS — I059 Rheumatic mitral valve disease, unspecified: Secondary | ICD-10-CM

## 2012-11-13 DIAGNOSIS — E785 Hyperlipidemia, unspecified: Secondary | ICD-10-CM

## 2012-11-13 DIAGNOSIS — I2510 Atherosclerotic heart disease of native coronary artery without angina pectoris: Secondary | ICD-10-CM

## 2012-11-13 DIAGNOSIS — I340 Nonrheumatic mitral (valve) insufficiency: Secondary | ICD-10-CM

## 2012-11-13 DIAGNOSIS — R0989 Other specified symptoms and signs involving the circulatory and respiratory systems: Secondary | ICD-10-CM

## 2012-11-13 DIAGNOSIS — I10 Essential (primary) hypertension: Secondary | ICD-10-CM

## 2012-11-13 MED ORDER — NITROGLYCERIN 0.4 MG SL SUBL: 0.4000 mg | SUBLINGUAL_TABLET | SUBLINGUAL | Status: DC | PRN

## 2012-11-13 NOTE — Assessment & Plan Note (Signed)
Very well controlled on current regimen

## 2012-11-13 NOTE — Progress Notes (Signed)
Primary Cardiologist: Rollene Rotunda, MD   HPI: Patient presents for evaluation of progressive DOE, per Dr. Jackolyn Confer request.  Patient presents with 2 month history of worsening DOE, with no associated CP. He denies symptoms suggestive of congestive heart failure. He was recently seen by Dr. Margo Common for routine followup, at which time he was started on Imdur 30 mg daily for treatment of his symptoms. Patient informs me today that this has induced palpable benefit, stabilizing the intensity of his DOE.   12-lead EKG today, reviewed by me, indicates sinus bradycardia at 59 bpm; nonspecific ST changes  Allergies  Allergen Reactions  . Sulfonamide Derivatives     Current Outpatient Prescriptions  Medication Sig Dispense Refill  . aspirin 81 MG tablet Take 81 mg by mouth daily.        . carbidopa-levodopa (SINEMET CR) 50-200 MG per tablet Take 1 tablet by mouth 3 (three) times daily.      [name] doxazosin (CARDURA) 2 MG tablet Take 2 mg by mouth at bedtime.        . isosorbide mononitrate (IMDUR) 30 MG 24 hr tablet Take 1 tablet by mouth daily.      . metoprolol tartrate (LOPRESSOR) 25 MG tablet TAKE (1) TABLET TWICE DAILY.  60 tablet  3  . nitroGLYCERIN (NITROSTAT) 0.4 MG SL tablet Place 0.4 mg under the tongue every 5 (five) minutes as needed.        [name] omeprazole (PRILOSEC) 20 MG capsule Take 20 mg by mouth daily.      . ropinirole (REQUIP) 5 MG tablet Take 5 mg by mouth 3 (three) times daily.      . simvastatin (ZOCOR) 40 MG tablet Take 1 tablet (40 mg total) by mouth at bedtime.  30 tablet  5  . traZODone (DESYREL) 50 MG tablet Take 1 tablet by mouth daily.       No current facility-administered medications for this visit.    Past Medical History  Diagnosis Date  . CAD (coronary artery disease)     Chronic 100% proximal LAD with distal collateralization, treated medically, residual moderate CFX ad RCA disease, 05/2007  . Hyperlipidemia   . Hypertension   . Parkinson's disease   .  Mitral regurgitation     Mild    Past Surgical History  Procedure Laterality Date  . Tonsillectomy    . Hand surgery      Bilateral  . Vasectomy      History   Social History  . Marital Status: Married    Spouse Name: N/A    Number of Children: N/A  . Years of Education: N/A   Occupational History  . Not on file.   Social History Main Topics  . Smoking status: Former Smoker    Quit date: 07/16/1989  . Smokeless tobacco: Not on file  . Alcohol Use: No  . Drug Use: No  . Sexually Active: Not on file   Other Topics Concern  . Not on file   Social History Narrative  . No narrative on file   Social History Narrative  . No narrative on file    Problem Relation Age of Onset  . Ovarian cancer Mother   . Coronary artery disease Father   . Alzheimer's disease Father   . Diabetes Other   . Transient ischemic attack Other     ROS: no nausea, vomiting; no fever, chills; no melena, hematochezia; no claudication  PHYSICAL EXAM: BP 104/54  Pulse 61  Ht 6' (1.829 m)  Wt 174 lb (78.926 kg)  BMI 23.59 kg/m2  SpO2 97% GENERAL: 79 year old male; NAD HEENT: NCAT, PERRLA, EOMI; sclera clear; no xanthelasma NECK: palpable bilateral carotid pulses, no bruits; no JVD; no TM LUNGS: diminished breath sounds CARDIAC: RRR (S1, S2); no significant murmurs; no rubs or gallops ABDOMEN: soft, non-tender; intact BS EXTREMETIES: no significant peripheral edema SKIN: warm/dry; no obvious rash/lesions MUSCULOSKELETAL: no joint deformity NEURO: no focal deficit; NL affect   EKG: reviewed and available in Electronic Records   ASSESSMENT & PLAN:  Exertional dyspnea Will proceed with initial extensive noninvasive evaluation with a GXT Cardiolite and a complete echocardiogram. Patient's most recent ischemic evaluation was a Lexiscan Cardiolite in March 2011, which yielded anterior scar/minor peri-infarct ischemia, but normal LVF (EF 60%) and normal wall motion. He has known  chronically occluded proximal LAD with distal collateralization, with residual mild/moderate CFX and RCA disease, by previous catheterization in 2008. He has agreed with initial noninvasive evaluation, but understands that if there is any significant change from his previous nuclear imaging study, that we would consider proceeding with a repeat cardiac catheterization. I also reviewed with him the fact that he seems to have gotten some relief with the recent addition of long-acting nitrate, and that we could therefore continue close monitoring with adjustment of his medications. Will also renew his prescription for NTG, and schedule early followup with me in the next 2 weeks, for review of study results and further recommendations. Following review of his previously described coronary anatomy, I would suggest maintaining a low threshold for a repeat procedure, if there is evidence of high risk anatomy by perfusion imaging, or he continues to have worsening symptoms.  DYSLIPIDEMIA Very well controlled on current dose simvastatin with recent LDL 67, followed by primary M.D.  HYPERTENSION Very well controlled on current regimen    Gene Dequavius Kuhner, PAC

## 2012-11-13 NOTE — Assessment & Plan Note (Signed)
Will proceed with initial extensive noninvasive evaluation with a GXT Cardiolite and a complete echocardiogram. Patient's most recent ischemic evaluation was a [facility] in March 2011, which yielded anterior scar/minor peri-infarct ischemia, but normal LVF (EF 60%) and normal wall motion. He has known chronically occluded proximal LAD with distal collateralization, with residual mild/moderate CFX and RCA disease, by previous catheterization in 2008. He has agreed with initial noninvasive evaluation, but understands that if there is any significant change from his previous nuclear imaging study, that we would consider proceeding with a repeat cardiac catheterization. I also reviewed with him the fact that he seems to have gotten some relief with the recent addition of long-acting nitrate, and that we could therefore continue close monitoring with adjustment of his medications. Will also renew his prescription for NTG, and schedule early followup with me in the next 2 weeks, for review of study results and further recommendations. Following review of his previously described coronary anatomy, I would suggest maintaining a low threshold for a repeat procedure, if there is evidence of high risk anatomy by perfusion imaging, or he continues to have worsening symptoms.

## 2012-11-13 NOTE — Assessment & Plan Note (Signed)
Very well controlled on current dose simvastatin with recent LDL 67, followed by primary M.D.

## 2012-11-13 NOTE — Patient Instructions (Addendum)
   GXT (graded exercise test) cardiolite  Echo  Office will notify of results Continue all current medications. Refill sent to pharmacy for Nitroglycerin Follow up in  2 weeks

## 2012-11-17 ENCOUNTER — Other Ambulatory Visit: Payer: Self-pay | Admitting: *Deleted

## 2012-11-17 DIAGNOSIS — I2510 Atherosclerotic heart disease of native coronary artery without angina pectoris: Secondary | ICD-10-CM

## 2012-11-19 ENCOUNTER — Other Ambulatory Visit: Payer: Self-pay

## 2012-11-19 ENCOUNTER — Other Ambulatory Visit: Payer: Self-pay | Admitting: *Deleted

## 2012-11-19 ENCOUNTER — Other Ambulatory Visit (INDEPENDENT_AMBULATORY_CARE_PROVIDER_SITE_OTHER): Payer: Medicare Other

## 2012-11-19 ENCOUNTER — Telehealth: Payer: Self-pay | Admitting: Physician Assistant

## 2012-11-19 DIAGNOSIS — I059 Rheumatic mitral valve disease, unspecified: Secondary | ICD-10-CM

## 2012-11-19 DIAGNOSIS — R0609 Other forms of dyspnea: Secondary | ICD-10-CM

## 2012-11-19 DIAGNOSIS — I2510 Atherosclerotic heart disease of native coronary artery without angina pectoris: Secondary | ICD-10-CM

## 2012-11-19 NOTE — Telephone Encounter (Signed)
GXT (graded exercise test) cardiolite FRIDAY, May 9th 2014 @ [facility]   Checking percert

## 2012-11-19 NOTE — Telephone Encounter (Signed)
Auth # [id number] exp 01/03/13

## 2012-11-21 ENCOUNTER — Telehealth: Payer: Self-pay | Admitting: *Deleted

## 2012-11-21 DIAGNOSIS — I2510 Atherosclerotic heart disease of native coronary artery without angina pectoris: Secondary | ICD-10-CM

## 2012-11-21 NOTE — Telephone Encounter (Signed)
Patient informed. 

## 2012-11-21 NOTE — Telephone Encounter (Signed)
Message copied by Eustace Moore on Fri Nov 21, 2012 11:04 AM ------      Message from: MCDOWELL, Illene Bolus      Created: Thu Nov 20, 2012  8:07 AM       Ordered by Mr. Serpe PAC. Reviewed report. Normal LVEF with moderate diastolic dysfunction, only trivial mitral regurgitation, pulmonary pressures not significantly elevated. ------

## 2012-11-25 ENCOUNTER — Telehealth: Payer: Self-pay | Admitting: *Deleted

## 2012-11-25 NOTE — Telephone Encounter (Signed)
Message copied by Lesle Chris on Tue Nov 25, 2012  4:55 PM ------      Message from: Prescott Parma C      Created: Mon Nov 24, 2012  5:20 PM       Intermediate risk study, to be reviewed at 2 week f/u visit re: ?cath ------

## 2012-11-25 NOTE — Telephone Encounter (Signed)
Notes Recorded by Lesle Chris, LPN on 10/22/8117 at 4:55 PM Patient notified. Already has follow up OV scheduled for this Thursday with Gene Serpe, PA.

## 2012-11-27 ENCOUNTER — Telehealth: Payer: Self-pay | Admitting: Cardiology

## 2012-11-27 ENCOUNTER — Encounter: Payer: Self-pay | Admitting: *Deleted

## 2012-11-27 ENCOUNTER — Encounter: Payer: Self-pay | Admitting: Physician Assistant

## 2012-11-27 ENCOUNTER — Ambulatory Visit (INDEPENDENT_AMBULATORY_CARE_PROVIDER_SITE_OTHER): Payer: Medicare Other | Admitting: Physician Assistant

## 2012-11-27 VITALS — BP 106/61 | HR 58 | Ht 72.0 in | Wt 163.0 lb

## 2012-11-27 DIAGNOSIS — I2510 Atherosclerotic heart disease of native coronary artery without angina pectoris: Secondary | ICD-10-CM

## 2012-11-27 DIAGNOSIS — Z01810 Encounter for preprocedural cardiovascular examination: Secondary | ICD-10-CM

## 2012-11-27 DIAGNOSIS — R9439 Abnormal result of other cardiovascular function study: Secondary | ICD-10-CM

## 2012-11-27 DIAGNOSIS — R0602 Shortness of breath: Secondary | ICD-10-CM

## 2012-11-27 NOTE — Patient Instructions (Signed)
   Left heart catherization - see info sheet given  Pre-cath labs today for BMET, CBC, PT, PTT  Follow up given after above

## 2012-11-27 NOTE — Progress Notes (Signed)
Primary Cardiologist: James Hochrein, MD   HPI: Scheduled early followup for review of GXT Cardiolite and echocardiogram results, following recent referral for evaluation of progressive DOE.   - Echocardiogram, May 7: Normal LVEF with moderate diastolic dysfunction, only trivial mitral regurgitation, pulmonary pressures not significantly elevated   - GXT stress Cardiolite, May 9: partially reversible anterior/anterior apical defects and inferolateral/lateral defects, suggestive of scar/peri-infarct ischemia; EF 63%  There was no reported CP during stress, but with abnormal ST segment changes in the inferior leads, and in leads V5,V6. Although this was an adequate study (87% MPHR; 4.6 METS), the patient exercised for only 3:43 minutes.  Clinically, he denies any recent exacerbation from his baseline exercise tolerance level. He continues to report no exertional CP; however, he does have significant DOE when walking up a flight of stairs or an incline, and only at times when walking on level ground.  Allergies  Allergen Reactions  . Sulfonamide Derivatives     Current Outpatient Prescriptions  Medication Sig Dispense Refill  . aspirin 81 MG tablet Take 81 mg by mouth daily.        . carbidopa-levodopa (SINEMET CR) 50-200 MG per tablet Take 1 tablet by mouth 3 (three) times daily.      . doxazosin (CARDURA) 2 MG tablet Take 2 mg by mouth at bedtime.        . isosorbide mononitrate (IMDUR) 30 MG 24 hr tablet Take 1 tablet by mouth daily.      . metoprolol tartrate (LOPRESSOR) 25 MG tablet TAKE (1) TABLET TWICE DAILY.  60 tablet  3  . nitroGLYCERIN (NITROSTAT) 0.4 MG SL tablet Place 1 tablet (0.4 mg total) under the tongue every 5 (five) minutes as needed.  25 tablet  3  . omeprazole (PRILOSEC) 20 MG capsule Take 20 mg by mouth daily.      . ropinirole (REQUIP) 5 MG tablet Take 5 mg by mouth 3 (three) times daily.      . simvastatin (ZOCOR) 40 MG tablet Take 1 tablet (40 mg total) by mouth at  bedtime.  30 tablet  5  . traZODone (DESYREL) 50 MG tablet Take 1 tablet by mouth daily.       No current facility-administered medications for this visit.    Past Medical History  Diagnosis Date  . CAD (coronary artery disease)     Chronic 100% proximal LAD with distal collateralization, treated medically, residual moderate CFX ad RCA disease, 05/2007  . Hyperlipidemia   . Hypertension   . Parkinson's disease   . Mitral regurgitation     Mild    Past Surgical History  Procedure Laterality Date  . Tonsillectomy    . Hand surgery      Bilateral  . Vasectomy      History   Social History  . Marital Status: Married    Spouse Name: N/A    Number of Children: N/A  . Years of Education: N/A   Occupational History  . Not on file.   Social History Main Topics  . Smoking status: Former Smoker    Quit date: 07/16/1989  . Smokeless tobacco: Not on file  . Alcohol Use: No  . Drug Use: No  . Sexually Active: Not on file   Other Topics Concern  . Not on file   Social History Narrative  . No narrative on file    Family History  Problem Relation Age of Onset  . Ovarian cancer Mother   . Coronary artery disease   Father   . Alzheimer's disease Father   . Diabetes Other   . Transient ischemic attack Other     ROS: no nausea, vomiting; no fever, chills; no melena, hematochezia; no claudication  PHYSICAL EXAM: BP 106/61  Pulse 58  Ht 6' (1.829 m)  Wt 163 lb (73.936 kg)  BMI 22.1 kg/m2  SpO2 95% GENERAL: 79-year-old male; NAD  HEENT: NCAT, PERRLA, EOMI; sclera clear; no xanthelasma  NECK: palpable bilateral carotid pulses, no bruits; no JVD; no TM  LUNGS: diminished breath sounds  CARDIAC: RRR (S1, S2); no significant murmurs; no rubs or gallops  ABDOMEN: soft, non-tender; intact BS  EXTREMETIES: no significant peripheral edema  SKIN: warm/dry; no obvious rash/lesions  MUSCULOSKELETAL: no joint deformity  NEURO: no focal deficit; NL affect    EKG:     ASSESSMENT & PLAN:  Exertional dyspnea Recommendation is to proceed with a diagnostic coronary angiogram to rule out significant coronary artery disease progression. The results of both the recent stress test, and his previous cardiac catheterization in 2008, were reviewed with Dr. McDowell today, who concurs with this plan. We also reviewed the patient's previous nuclear imaging study from 2011, which was also abnormal, revealing evidence of prior inferior infarct/mild peri-infarct ischemia and inferior thinning, but with normal wall motion. The concern now is that there may be some disease progression in the RCA distribution, compromising the distal collateral flow to the known 100% occluded proximal LAD. I also noted to the patient that I am unable to up titrate his current medications, including recent addition of Imdur, secondary to relative hypotension, apart from his low resting heart rate.The patient is agreeable with this recommendation, and the risks/benefits of the procedure were reviewed.    Gene Kiira Brach, PAC  

## 2012-11-27 NOTE — Assessment & Plan Note (Signed)
Recommendation is to proceed with a diagnostic coronary angiogram to rule out significant coronary artery disease progression. The results of both the recent stress test, and his previous cardiac catheterization in 2008, were reviewed with Dr. Diona Browner today, who concurs with this plan. We also reviewed the patient's previous nuclear imaging study from 2011, which was also abnormal, revealing evidence of prior inferior infarct/mild peri-infarct ischemia and inferior thinning, but with normal wall motion. The concern now is that there may be some disease progression in the RCA distribution, compromising the distal collateral flow to the known 100% occluded proximal LAD. I also noted to the patient that I am unable to up titrate his current medications, including recent addition of Imdur, secondary to relative hypotension, apart from his low resting heart rate.The patient is agreeable with this recommendation, and the risks/benefits of the procedure were reviewed.

## 2012-11-27 NOTE — Telephone Encounter (Signed)
L JV heart cath - Tuesday, 5/20 - [place] - 8:30

## 2012-11-28 ENCOUNTER — Other Ambulatory Visit: Payer: Self-pay | Admitting: Physician Assistant

## 2012-12-02 ENCOUNTER — Inpatient Hospital Stay (HOSPITAL_BASED_OUTPATIENT_CLINIC_OR_DEPARTMENT_OTHER)
Admission: RE | Admit: 2012-12-02 | Discharge: 2012-12-02 | Disposition: A | Discharge status: Home / Self Care | Payer: Medicare Other | Source: Ambulatory Visit | Attending: Cardiology | Admitting: Cardiology

## 2012-12-02 ENCOUNTER — Encounter (HOSPITAL_BASED_OUTPATIENT_CLINIC_OR_DEPARTMENT_OTHER)
Admission: RE | Disposition: A | Discharge status: Home / Self Care | Payer: Self-pay | Source: Ambulatory Visit | Attending: Cardiology

## 2012-12-02 DIAGNOSIS — R0989 Other specified symptoms and signs involving the circulatory and respiratory systems: Secondary | ICD-10-CM | POA: Insufficient documentation

## 2012-12-02 DIAGNOSIS — E785 Hyperlipidemia, unspecified: Secondary | ICD-10-CM | POA: Insufficient documentation

## 2012-12-02 DIAGNOSIS — I059 Rheumatic mitral valve disease, unspecified: Secondary | ICD-10-CM | POA: Insufficient documentation

## 2012-12-02 DIAGNOSIS — I2510 Atherosclerotic heart disease of native coronary artery without angina pectoris: Secondary | ICD-10-CM | POA: Insufficient documentation

## 2012-12-02 DIAGNOSIS — Z79899 Other long term (current) drug therapy: Secondary | ICD-10-CM | POA: Insufficient documentation

## 2012-12-02 DIAGNOSIS — R9439 Abnormal result of other cardiovascular function study: Secondary | ICD-10-CM | POA: Insufficient documentation

## 2012-12-02 DIAGNOSIS — G20A1 Parkinson's disease without dyskinesia, without mention of fluctuations: Secondary | ICD-10-CM | POA: Insufficient documentation

## 2012-12-02 DIAGNOSIS — I10 Essential (primary) hypertension: Secondary | ICD-10-CM | POA: Insufficient documentation

## 2012-12-02 DIAGNOSIS — I2582 Chronic total occlusion of coronary artery: Secondary | ICD-10-CM | POA: Insufficient documentation

## 2012-12-02 DIAGNOSIS — R0609 Other forms of dyspnea: Secondary | ICD-10-CM | POA: Insufficient documentation

## 2012-12-02 DIAGNOSIS — I519 Heart disease, unspecified: Secondary | ICD-10-CM | POA: Insufficient documentation

## 2012-12-02 DIAGNOSIS — G20 Parkinson's disease: Secondary | ICD-10-CM | POA: Insufficient documentation

## 2012-12-02 SURGERY — JV LEFT HEART CATHETERIZATION WITH CORONARY ANGIOGRAM
Anesthesia: Moderate Sedation

## 2012-12-02 MED ORDER — SODIUM CHLORIDE 0.9 % IJ SOLN: 3.0000 mL | Freq: Two times a day (BID) | INTRAMUSCULAR | Status: DC

## 2012-12-02 MED ORDER — SODIUM CHLORIDE 0.9 % IV SOLN: 1.0000 mL/kg/h | INTRAVENOUS | Status: DC

## 2012-12-02 MED ORDER — ASPIRIN 81 MG PO CHEW: 324.0000 mg | CHEWABLE_TABLET | ORAL | Status: DC

## 2012-12-02 MED ORDER — SODIUM CHLORIDE 0.9 % IV SOLN: INTRAVENOUS | Status: DC

## 2012-12-02 MED ORDER — ACETAMINOPHEN 325 MG PO TABS: 650.0000 mg | ORAL_TABLET | ORAL | Status: DC | PRN

## 2012-12-02 MED ORDER — SODIUM CHLORIDE 0.9 % IV SOLN: 250.0000 mL | INTRAVENOUS | Status: DC | PRN

## 2012-12-02 MED ORDER — SODIUM CHLORIDE 0.9 % IJ SOLN: 3.0000 mL | INTRAMUSCULAR | Status: DC | PRN

## 2012-12-02 MED ORDER — ASPIRIN 81 MG PO CHEW
324.0000 mg | CHEWABLE_TABLET | ORAL | Status: AC
  Administered 2012-12-02: 324 mg via ORAL

## 2012-12-02 MED ORDER — ONDANSETRON HCL 4 MG/2ML IJ SOLN: 4.0000 mg | Freq: Four times a day (QID) | INTRAMUSCULAR | Status: DC | PRN

## 2012-12-02 NOTE — Interval H&P Note (Signed)
History and Physical Interval Note:  12/02/2012 8:34 AM  Jorge Lester  has presented today for surgery, with the diagnosis of SOB. ABN stress test  The various methods of treatment have been discussed with the patient and family. After consideration of risks, benefits and other options for treatment, the patient has consented to  Procedure(s): JV LEFT HEART CATHETERIZATION WITH CORONARY ANGIOGRAM (N/A) as a surgical intervention .  The patient's history has been reviewed, patient examined, no change in status, stable for surgery.  I have reviewed the patient's chart and labs.  Questions were answered to the patient's satisfaction.     Theron Arista [facility] 12/02/2012 8:34 AM

## 2012-12-02 NOTE — H&P (View-Only) (Signed)
Primary Cardiologist: Rollene Rotunda, MD   HPI: Scheduled early followup for review of GXT Cardiolite and echocardiogram results, following recent referral for evaluation of progressive DOE.   - Echocardiogram, May 7: Normal LVEF with moderate diastolic dysfunction, only trivial mitral regurgitation, pulmonary pressures not significantly elevated   - GXT stress Cardiolite, May 9: partially reversible anterior/anterior apical defects and inferolateral/lateral defects, suggestive of scar/peri-infarct ischemia; EF 63%  There was no reported CP during stress, but with abnormal ST segment changes in the inferior leads, and in leads V5,V6. Although this was an adequate study (87% MPHR; 4.6 METS), the patient exercised for only 3:43 minutes.  Clinically, he denies any recent exacerbation from his baseline exercise tolerance level. He continues to report no exertional CP; however, he does have significant DOE when walking up a flight of stairs or an incline, and only at times when walking on level ground.  Allergies  Allergen Reactions  . Sulfonamide Derivatives     Current Outpatient Prescriptions  Medication Sig Dispense Refill  . aspirin 81 MG tablet Take 81 mg by mouth daily.        . carbidopa-levodopa (SINEMET CR) 50-200 MG per tablet Take 1 tablet by mouth 3 (three) times daily.      [name] doxazosin (CARDURA) 2 MG tablet Take 2 mg by mouth at bedtime.        . isosorbide mononitrate (IMDUR) 30 MG 24 hr tablet Take 1 tablet by mouth daily.      . metoprolol tartrate (LOPRESSOR) 25 MG tablet TAKE (1) TABLET TWICE DAILY.  60 tablet  3  . nitroGLYCERIN (NITROSTAT) 0.4 MG SL tablet Place 1 tablet (0.4 mg total) under the tongue every 5 (five) minutes as needed.  25 tablet  3  . omeprazole (PRILOSEC) 20 MG capsule Take 20 mg by mouth daily.      . ropinirole (REQUIP) 5 MG tablet Take 5 mg by mouth 3 (three) times daily.      . simvastatin (ZOCOR) 40 MG tablet Take 1 tablet (40 mg total) by mouth at  bedtime.  30 tablet  5  . traZODone (DESYREL) 50 MG tablet Take 1 tablet by mouth daily.       No current facility-administered medications for this visit.    Past Medical History  Diagnosis Date  . CAD (coronary artery disease)     Chronic 100% proximal LAD with distal collateralization, treated medically, residual moderate CFX ad RCA disease, 05/2007  . Hyperlipidemia   . Hypertension   . Parkinson's disease   . Mitral regurgitation     Mild    Past Surgical History  Procedure Laterality Date  . Tonsillectomy    . Hand surgery      Bilateral  . Vasectomy      History   Social History  . Marital Status: Married    Spouse Name: N/A    Number of Children: N/A  . Years of Education: N/A   Occupational History  . Not on file.   Social History Main Topics  . Smoking status: Former Smoker    Quit date: 07/16/1989  . Smokeless tobacco: Not on file  . Alcohol Use: No  . Drug Use: No  . Sexually Active: Not on file   Other Topics Concern  . Not on file   Social History Narrative  . No narrative on file    Family History  Problem Relation Age of Onset  . Ovarian cancer Mother   . Coronary artery disease  Father   . Alzheimer's disease Father   . Diabetes Other   . Transient ischemic attack Other     ROS: no nausea, vomiting; no fever, chills; no melena, hematochezia; no claudication  PHYSICAL EXAM: BP 106/61  Pulse 58  Ht 6' (1.829 m)  Wt 163 lb (73.936 kg)  BMI 22.1 kg/m2  SpO2 95% GENERAL: 79 year old male; NAD  HEENT: NCAT, PERRLA, EOMI; sclera clear; no xanthelasma  NECK: palpable bilateral carotid pulses, no bruits; no JVD; no TM  LUNGS: diminished breath sounds  CARDIAC: RRR (S1, S2); no significant murmurs; no rubs or gallops  ABDOMEN: soft, non-tender; intact BS  EXTREMETIES: no significant peripheral edema  SKIN: warm/dry; no obvious rash/lesions  MUSCULOSKELETAL: no joint deformity  NEURO: no focal deficit; NL affect    EKG:     ASSESSMENT & PLAN:  Exertional dyspnea Recommendation is to proceed with a diagnostic coronary angiogram to rule out significant coronary artery disease progression. The results of both the recent stress test, and his previous cardiac catheterization in 2008, were reviewed with Dr. Diona Browner today, who concurs with this plan. We also reviewed the patient's previous nuclear imaging study from 2011, which was also abnormal, revealing evidence of prior inferior infarct/mild peri-infarct ischemia and inferior thinning, but with normal wall motion. The concern now is that there may be some disease progression in the RCA distribution, compromising the distal collateral flow to the known 100% occluded proximal LAD. I also noted to the patient that I am unable to up titrate his current medications, including recent addition of Imdur, secondary to relative hypotension, apart from his low resting heart rate.The patient is agreeable with this recommendation, and the risks/benefits of the procedure were reviewed.    Gene Jhovany Weidinger, PAC

## 2012-12-02 NOTE — OR Nursing (Signed)
Dr Jordan at bedside to discuss results and treatment plan with pt and family 

## 2012-12-02 NOTE — OR Nursing (Signed)
TR band removed, site level 0, pressure dressing and wrist splint applied, distal circulation intact, discharge instructions removed, pt stated understanding, ambulated in hall without difficulty, transported to wife's car via wheelchair

## 2012-12-02 NOTE — CV Procedure (Signed)
   Cardiac Catheterization Procedure Note  Name: Jorge Lester MRN: [id number] DOB: [birth year]  Procedure: Left Heart Cath, Selective Coronary Angiography, LV angiography  Indication: 79 year-old white male with history of coronary disease. Cardiac catheterization in 2008 showed occlusion of the mid LAD with collateral flow. He was treated medically. He presents now with symptoms of increased dyspnea on exertion. Stress Myoview study shows moderate to severe ischemic abnormality in the anterior wall and apex. Exercise tolerance was poor. He had significant ST segment changes and symptoms of dyspnea without chest pain. Ejection fraction was normal. Patient has been on antianginal therapy with metoprolol and nitrates.   Procedural Details: The right wrist was prepped, draped, and anesthetized with 1% lidocaine. Using the modified Seldinger technique, a 5 French sheath was introduced into the right radial artery. 3 mg of verapamil was administered through the sheath, weight-based unfractionated heparin was administered intravenously. Standard Judkins catheters were used for selective coronary angiography and left ventriculography. Catheter exchanges were performed over an exchange length guidewire. There were no immediate procedural complications. A TR band was used for radial hemostasis at the completion of the procedure.  The patient was transferred to the post catheterization recovery area for further monitoring.  Procedural Findings: Hemodynamics: AO 117/52 with a mean of 79 mmHg LV 116/14 mmHg  Coronary angiography: Coronary dominance: right  Left mainstem: The left main coronary is normal.  Left anterior descending (LAD): The left anterior descending artery is diffusely diseased in the proximal vessel up to 75%. It is occluded in the mid vessel. There are left to left collaterals from the ramus intermediate branch. There are also right-to-left collaterals via septal perforators to the  distal LAD. The proximal Is well-defined and there appears to be a small tract through the occlusion.  There is a ramus intermediate branch which is moderate to large. It has mild irregularities less than 20%.  Left circumflex (LCx): The left circumflex gives rise to a single bifurcating marginal branch. The proximal OM has a segmental 60% stenosis.  Right coronary artery (RCA): The right coronary is a large dominant vessel. It is diffusely diseased throughout with stenosis up to 40% in the mid vessel. There is significant plaque burden throughout without significant stenosis.  Left ventriculography: Left ventricular systolic function is normal, LVEF is estimated at 55-65%, there is no significant mitral regurgitation   Final Conclusions:   1. Single vessel occlusive coronary disease with chronic total occlusion of the mid LAD. 2. Normal left ventricular function.  Recommendations: The patient has significant symptoms and ischemia on noninvasive testing despite 2 antianginal agents. His LAD CTO appears suitable for PCI. The alternative would be to increase his antianginal therapy. I will discuss options with the patient.  Theron Arista [facility] 12/02/2012, 9:11 AM

## 2012-12-02 NOTE — OR Nursing (Signed)
+  Allen's test right hand 

## 2012-12-09 ENCOUNTER — Telehealth: Payer: Self-pay | Admitting: Cardiology

## 2012-12-09 ENCOUNTER — Ambulatory Visit (INDEPENDENT_AMBULATORY_CARE_PROVIDER_SITE_OTHER): Payer: Medicare Other | Admitting: Cardiology

## 2012-12-09 ENCOUNTER — Encounter: Payer: Self-pay | Admitting: Cardiology

## 2012-12-09 VITALS — BP 110/54 | HR 58 | Ht 72.0 in | Wt 164.8 lb

## 2012-12-09 DIAGNOSIS — I10 Essential (primary) hypertension: Secondary | ICD-10-CM

## 2012-12-09 DIAGNOSIS — I2510 Atherosclerotic heart disease of native coronary artery without angina pectoris: Secondary | ICD-10-CM

## 2012-12-09 DIAGNOSIS — E785 Hyperlipidemia, unspecified: Secondary | ICD-10-CM

## 2012-12-09 MED ORDER — FAMOTIDINE 20 MG PO TABS: ORAL_TABLET | ORAL | Status: DC

## 2012-12-09 MED ORDER — CLOPIDOGREL BISULFATE 75 MG PO TABS: 75.0000 mg | ORAL_TABLET | Freq: Every day | ORAL | Status: DC

## 2012-12-09 NOTE — Telephone Encounter (Signed)
Patient's wife called from their pharmacy and said that the Pepcid will be cheaper with a prescription.  I escribed Pepcid 20 mg BID to [facility] Lester in [place], [place] per patient request

## 2012-12-09 NOTE — Telephone Encounter (Signed)
Follow Up     Calling in following up on new prescription ordered today for reflux. Please call.

## 2012-12-09 NOTE — Progress Notes (Signed)
 Jorge Lester Date of Birth: 01/17/1934 Medical Record #[id number]  History of Present Illness: Jorge Lester is seen today to discuss PCI. He presented with symptoms of progressive dyspnea on exertion. He had a nuclear stress test that showed reversible anterior, anterior apical, and inferior lateral defects. Ejection fraction 63%. Cardiac catheterization recently demonstrated total occlusion of the mid LAD. This is unchanged compared to 2008. He did have a favorable appearance for PCI with a well-defined proximal cap and short occlusion length. There actually appeared to be a channel through the occlusion. Since his heart catheterization he has had no complications. His radial site is without hematoma. He still complains of dyspnea on exertion.  Current Outpatient Prescriptions on File Prior to Visit  Medication Sig Dispense Refill  . aspirin 81 MG tablet Take 81 mg by mouth daily.        . carbidopa-levodopa (SINEMET CR) 50-200 MG per tablet Take 1 tablet by mouth 3 (three) times daily.      . doxazosin (CARDURA) 2 MG tablet Take 2 mg by mouth at bedtime.        . isosorbide mononitrate (IMDUR) 30 MG 24 hr tablet Take 1 tablet by mouth daily.      . metoprolol tartrate (LOPRESSOR) 25 MG tablet TAKE (1) TABLET TWICE DAILY.  60 tablet  3  . nitroGLYCERIN (NITROSTAT) 0.4 MG SL tablet Place 1 tablet (0.4 mg total) under the tongue every 5 (five) minutes as needed.  25 tablet  3  . ropinirole (REQUIP) 5 MG tablet Take 5 mg by mouth 3 (three) times daily.      . simvastatin (ZOCOR) 40 MG tablet Take 1 tablet (40 mg total) by mouth at bedtime.  30 tablet  5  . traZODone (DESYREL) 50 MG tablet Take 1 tablet by mouth daily.       No current facility-administered medications on file prior to visit.    Allergies  Allergen Reactions  . Sulfonamide Derivatives     Past Medical History  Diagnosis Date  . CAD (coronary artery disease)     Chronic 100% proximal LAD with distal collateralization,  treated medically, residual moderate CFX ad RCA disease, 05/2007  . Hyperlipidemia   . Hypertension   . Parkinson's disease   . Mitral regurgitation     Mild    Past Surgical History  Procedure Laterality Date  . Tonsillectomy    . Hand surgery      Bilateral  . Vasectomy      History  Smoking status  . Former Smoker  . Quit date: 07/16/1989  Smokeless tobacco  . Not on file    History  Alcohol Use No    Family History  Problem Relation Age of Onset  . Ovarian cancer Mother   . Coronary artery disease Father   . Alzheimer's disease Father   . Diabetes Other   . Transient ischemic attack Other     Review of Systems: The review of systems is positive for dyspnea on exertion. He also has Parkinson's symptoms. All other systems were reviewed and are negative.  Physical Exam: BP 110/54  Pulse 58  Ht 6' (1.829 m)  Wt 164 lb 12.8 oz (74.753 kg)  BMI 22.35 kg/m2  SpO2 98% Is a pleasant white male in no acute distress. HEENT: No masses, atraumatic. Pupils equal round and reactive to light accommodation. Extraocular movements are full. Oropharynx is clear. Neck: No carotid bruits. No JVD, adenopathy, thyromegaly Lungs: Clear Cardiovascular: Regular rate and   rhythm without gallop, murmur, or click. Abdomen: Soft and nontender. Bowel sounds are intact. No masses or bruits. Extremities: No cyanosis or edema. Radial and pedal pulses are 2+ and symmetric. Neuro: Alert and oriented x3. Cranial nerves II through XII are intact. Skin: Warm and dry  LABORATORY DATA: ECG today demonstrates normal sinus rhythm with PACs. Rate is 58 beats per minute. It is otherwise normal.  Assessment / Plan: 1. Coronary disease with class 2-3 angina (dyspnea) on 2 antianginal agents. Myoview study demonstrates significant anterior and apical ischemia with well-preserved LV function. He has chronic total occlusion of the mid LAD which appears favorable for revascularization with PCI. We  discussed the rationale for intervention. I did explain that I could not guarantee that this procedure would eliminate his dyspnea but there was significant evidence by his stress test that revascularization would help. We discussed the procedure in detail including potential for bilateral femoral access. The risks of the procedure were reviewed in detail and include but are not exclusive of myocardial infarction, bleeding, arrhythmia, perforation, radiation injury, contrast induced nephropathy, and CVA. Patient's questions were answered and he is agreeable to proceed. We will tentatively schedule this procedure for 01/01/2013. Patient will be started on Plavix 75 mg daily. I recommended switching his omeprazole to either Zantac or Pepcid. He will continue his other medications.  

## 2012-12-09 NOTE — Patient Instructions (Addendum)
We will schedule you for coronary intervention.  Start Plavix 75 mg daily  Stop omeprazole and start Zantac 300 mg or Pepcid 20 mg for acid reflux.

## 2013-01-01 ENCOUNTER — Encounter (HOSPITAL_COMMUNITY): Payer: Self-pay | Admitting: General Practice

## 2013-01-01 ENCOUNTER — Encounter (HOSPITAL_COMMUNITY)
Admission: RE | Disposition: A | Discharge status: Home / Self Care | Payer: Self-pay | Source: Ambulatory Visit | Attending: Cardiology

## 2013-01-01 ENCOUNTER — Ambulatory Visit (HOSPITAL_COMMUNITY)
Admission: RE | Admit: 2013-01-01 | Discharge: 2013-01-02 | Disposition: A | Discharge status: Home / Self Care | Payer: Medicare Other | Source: Ambulatory Visit | Attending: Cardiology | Admitting: Cardiology

## 2013-01-01 DIAGNOSIS — I2510 Atherosclerotic heart disease of native coronary artery without angina pectoris: Secondary | ICD-10-CM | POA: Diagnosis present

## 2013-01-01 DIAGNOSIS — I059 Rheumatic mitral valve disease, unspecified: Secondary | ICD-10-CM | POA: Insufficient documentation

## 2013-01-01 DIAGNOSIS — K219 Gastro-esophageal reflux disease without esophagitis: Secondary | ICD-10-CM | POA: Insufficient documentation

## 2013-01-01 DIAGNOSIS — G4730 Sleep apnea, unspecified: Secondary | ICD-10-CM | POA: Insufficient documentation

## 2013-01-01 DIAGNOSIS — G20A1 Parkinson's disease without dyskinesia, without mention of fluctuations: Secondary | ICD-10-CM | POA: Diagnosis present

## 2013-01-01 DIAGNOSIS — R0989 Other specified symptoms and signs involving the circulatory and respiratory systems: Secondary | ICD-10-CM | POA: Insufficient documentation

## 2013-01-01 DIAGNOSIS — E785 Hyperlipidemia, unspecified: Secondary | ICD-10-CM | POA: Diagnosis present

## 2013-01-01 DIAGNOSIS — Z7982 Long term (current) use of aspirin: Secondary | ICD-10-CM | POA: Insufficient documentation

## 2013-01-01 DIAGNOSIS — M109 Gout, unspecified: Secondary | ICD-10-CM | POA: Insufficient documentation

## 2013-01-01 DIAGNOSIS — I209 Angina pectoris, unspecified: Secondary | ICD-10-CM | POA: Insufficient documentation

## 2013-01-01 DIAGNOSIS — R0609 Other forms of dyspnea: Secondary | ICD-10-CM | POA: Insufficient documentation

## 2013-01-01 DIAGNOSIS — Z79899 Other long term (current) drug therapy: Secondary | ICD-10-CM | POA: Insufficient documentation

## 2013-01-01 DIAGNOSIS — I10 Essential (primary) hypertension: Secondary | ICD-10-CM | POA: Diagnosis present

## 2013-01-01 DIAGNOSIS — G20 Parkinson's disease: Secondary | ICD-10-CM | POA: Diagnosis present

## 2013-01-01 DIAGNOSIS — I2582 Chronic total occlusion of coronary artery: Secondary | ICD-10-CM | POA: Insufficient documentation

## 2013-01-01 HISTORY — DX: Gastro-esophageal reflux disease without esophagitis: K21.9

## 2013-01-01 HISTORY — PX: CORONARY ANGIOPLASTY WITH STENT PLACEMENT: SHX49

## 2013-01-01 HISTORY — DX: Sleep apnea, unspecified: G47.30

## 2013-01-01 HISTORY — PX: PERCUTANEOUS CORONARY STENT INTERVENTION (PCI-S): SHX5485

## 2013-01-01 HISTORY — DX: Gout, unspecified: M10.9

## 2013-01-01 LAB — BASIC METABOLIC PANEL
CO2: 26 mEq/L (ref 19–32)
Calcium: 8.8 mg/dL (ref 8.4–10.5)
Chloride: 103 mEq/L (ref 96–112)
Glucose, Bld: 107 mg/dL — ABNORMAL HIGH (ref 70–99)
Sodium: 137 mEq/L (ref 135–145)

## 2013-01-01 LAB — CBC
HCT: 37.5 % — ABNORMAL LOW (ref 39.0–52.0)
Hemoglobin: 12.6 g/dL — ABNORMAL LOW (ref 13.0–17.0)
MCHC: 33.6 g/dL (ref 30.0–36.0)
RBC: 4.25 MIL/uL (ref 4.22–5.81)

## 2013-01-01 LAB — POCT ACTIVATED CLOTTING TIME
Activated Clotting Time: 225 seconds
Activated Clotting Time: 301 seconds

## 2013-01-01 LAB — PROTIME-INR: INR: 1.05 (ref 0.00–1.49)

## 2013-01-01 SURGERY — PERCUTANEOUS CORONARY STENT INTERVENTION (PCI-S)
Anesthesia: LOCAL

## 2013-01-01 MED ORDER — FENTANYL CITRATE 0.05 MG/ML IJ SOLN
INTRAMUSCULAR | Status: AC
  Filled 2013-01-01: qty 2

## 2013-01-01 MED ORDER — ASPIRIN 81 MG PO CHEW
81.0000 mg | CHEWABLE_TABLET | Freq: Every day | ORAL | Status: DC
  Filled 2013-01-01: qty 1

## 2013-01-01 MED ORDER — MIDAZOLAM HCL 2 MG/2ML IJ SOLN
INTRAMUSCULAR | Status: AC
  Filled 2013-01-01: qty 2

## 2013-01-01 MED ORDER — NITROGLYCERIN 0.4 MG SL SUBL: 0.4000 mg | SUBLINGUAL_TABLET | SUBLINGUAL | Status: DC | PRN

## 2013-01-01 MED ORDER — METOPROLOL TARTRATE 25 MG PO TABS
25.0000 mg | ORAL_TABLET | Freq: Two times a day (BID) | ORAL | Status: DC
  Administered 2013-01-01: 25 mg via ORAL
  Filled 2013-01-01 (×3): qty 1

## 2013-01-01 MED ORDER — ONDANSETRON HCL 4 MG/2ML IJ SOLN: 4.0000 mg | Freq: Four times a day (QID) | INTRAMUSCULAR | Status: DC | PRN

## 2013-01-01 MED ORDER — NITROGLYCERIN 0.2 MG/ML ON CALL CATH LAB
INTRAVENOUS | Status: AC
  Filled 2013-01-01: qty 1

## 2013-01-01 MED ORDER — SODIUM CHLORIDE 0.9 % IV SOLN: 1.0000 mL/kg/h | INTRAVENOUS | Status: AC

## 2013-01-01 MED ORDER — SIMVASTATIN 40 MG PO TABS
40.0000 mg | ORAL_TABLET | Freq: Every day | ORAL | Status: DC
  Administered 2013-01-01: 21:00:00 40 mg via ORAL
  Filled 2013-01-01 (×2): qty 1

## 2013-01-01 MED ORDER — ISOSORBIDE MONONITRATE ER 30 MG PO TB24
30.0000 mg | ORAL_TABLET | Freq: Every day | ORAL | Status: DC
  Filled 2013-01-01: qty 1

## 2013-01-01 MED ORDER — FAMOTIDINE 20 MG PO TABS
20.0000 mg | ORAL_TABLET | Freq: Two times a day (BID) | ORAL | Status: DC
  Administered 2013-01-01: 21:00:00 20 mg via ORAL
  Filled 2013-01-01 (×3): qty 1

## 2013-01-01 MED ORDER — CLOPIDOGREL BISULFATE 75 MG PO TABS: 75.0000 mg | ORAL_TABLET | Freq: Every day | ORAL | Status: DC

## 2013-01-01 MED ORDER — ROPINIROLE HCL 1 MG PO TABS
5.0000 mg | ORAL_TABLET | Freq: Three times a day (TID) | ORAL | Status: DC
  Administered 2013-01-01: 5 mg via ORAL
  Filled 2013-01-01 (×4): qty 5

## 2013-01-01 MED ORDER — ASPIRIN 81 MG PO CHEW
CHEWABLE_TABLET | ORAL | Status: AC
  Filled 2013-01-01: qty 4

## 2013-01-01 MED ORDER — LIDOCAINE HCL (PF) 1 % IJ SOLN
INTRAMUSCULAR | Status: AC
  Filled 2013-01-01: qty 30

## 2013-01-01 MED ORDER — DOXAZOSIN MESYLATE 2 MG PO TABS
2.0000 mg | ORAL_TABLET | Freq: Every day | ORAL | Status: DC
  Filled 2013-01-01: qty 1

## 2013-01-01 MED ORDER — SODIUM CHLORIDE 0.9 % IV SOLN
INTRAVENOUS | Status: DC
  Administered 2013-01-01: 07:00:00 via INTRAVENOUS

## 2013-01-01 MED ORDER — SODIUM CHLORIDE 0.9 % IJ SOLN: 3.0000 mL | Freq: Two times a day (BID) | INTRAMUSCULAR | Status: DC

## 2013-01-01 MED ORDER — ASPIRIN 81 MG PO CHEW
324.0000 mg | CHEWABLE_TABLET | ORAL | Status: AC
  Administered 2013-01-01: 324 mg via ORAL

## 2013-01-01 MED ORDER — ASPIRIN 81 MG PO TABS: 81.0000 mg | ORAL_TABLET | Freq: Every day | ORAL | Status: DC

## 2013-01-01 MED ORDER — SODIUM CHLORIDE 0.9 % IJ SOLN: 3.0000 mL | INTRAMUSCULAR | Status: DC | PRN

## 2013-01-01 MED ORDER — SODIUM CHLORIDE 0.9 % IV SOLN: 250.0000 mL | INTRAVENOUS | Status: DC | PRN

## 2013-01-01 MED ORDER — CARBIDOPA-LEVODOPA ER 50-200 MG PO TBCR
1.0000 | EXTENDED_RELEASE_TABLET | Freq: Three times a day (TID) | ORAL | Status: DC
  Administered 2013-01-01: 1 via ORAL
  Filled 2013-01-01 (×5): qty 1

## 2013-01-01 MED ORDER — TRAZODONE HCL 50 MG PO TABS
50.0000 mg | ORAL_TABLET | Freq: Every day | ORAL | Status: DC
  Administered 2013-01-01: 50 mg via ORAL
  Filled 2013-01-01 (×2): qty 1

## 2013-01-01 MED ORDER — HEPARIN (PORCINE) IN NACL 2-0.9 UNIT/ML-% IJ SOLN
INTRAMUSCULAR | Status: AC
  Filled 2013-01-01: qty 1000

## 2013-01-01 MED ORDER — ACETAMINOPHEN 325 MG PO TABS
650.0000 mg | ORAL_TABLET | ORAL | Status: DC | PRN
  Administered 2013-01-01: 650 mg via ORAL
  Filled 2013-01-01: qty 2

## 2013-01-01 NOTE — Progress Notes (Signed)
Utilization Review Completed Rohaan Durnil J. Lantz Hermann, RN, BSN, NCM [phone]  

## 2013-01-01 NOTE — Interval H&P Note (Signed)
History and Physical Interval Note:  01/01/2013 9:54 AM  Jorge Lester  has presented today for surgery, with the diagnosis of cad  The various methods of treatment have been discussed with the patient and family. After consideration of risks, benefits and other options for treatment, the patient has consented to  Procedure(s): PERCUTANEOUS CORONARY STENT INTERVENTION (PCI-S) (N/A) as a surgical intervention .  The patient's history has been reviewed, patient examined, no change in status, stable for surgery.  I have reviewed the patient's chart and labs.  Questions were answered to the patient's satisfaction.     Theron Arista [facility] 01/01/2013 9:54 AM

## 2013-01-01 NOTE — CV Procedure (Signed)
   CARDIAC CATH NOTE  Name: Jorge Lester MRN: [id number] DOB: 1934-03-08  Procedure: PTCA and stenting of the mid LAD for CTO  Indication: 79 year-old white male with chronic total occlusion of the mid LAD. Patient has class III angina and stress Myoview study demonstrated large area of anterior apical ischemia. Patient has been on 3 anti-anginal medications with persistent symptoms. PCI of the LAD CT was recommended.  Procedural Details: The right groin was prepped, draped, and anesthetized with 1% lidocaine. Using the modified Seldinger technique, a long 8 Fr sheath was introduced into the right femoral artery. The left groin was prepped, draped, and anesthetized 1% lidocaine. Using the modified Seldinger technique, a 6 French sheath was inserted into the left femoral artery. Weight-based heparin was given for anticoagulation. Once a therapeutic ACT was achieved, a 7 [place] XB LAD 4 guide catheter was inserted and used to engage the left main. A 6 French diagnostic JR 4 catheter was used via the left femoral approach for visualization of the right coronary. Simultaneous left and right coronary injections were performed to visualize collateral channels.  A Fielder XT coronary guidewire was used to cross the lesion in the LAD.  To confirm that we were within the true lumen a 1.5 mm balloon was placed over the wire distal to the occlusion. Injection through the balloon confirmed that we were in the true lumen. The lesion was predilated with a 1.5 mm balloon. The lesion was then predilated with a 2.5 mm balloon. The lesion was then stented with a 2.5 x 38 mm Promus premier stent.  A second 2.75 x 12 mm Promus premier stent was placed in an overlapping fashion to cover the proximal segment of disease. The stent was postdilated with the 2.75 mm balloon to 16 atmospheres.  Following PCI, there was 0% residual stenosis and TIMI-3 flow. The distal LAD branches were relatively small in caliber. Final  angiography confirmed an excellent result. The patient tolerated the procedure well. There were no immediate procedural complications. Femoral hemostasis was achieved with Angio-Seal devices in both groins with good hemostasis.. The patient was transferred to the post catheterization recovery area for further monitoring. A total of 335 cc of contrast was used.  Lesion Data: Vessel: LAD Percent stenosis (pre): 100% TIMI-flow (pre):  0 Stent:  2.5 x 38 and 2.75 x 12 mm Promus premier stents Percent stenosis (post): 0% TIMI-flow (post): 3  Conclusions:  Successful stenting of the mid LAD for CTO with drug-eluting stents.  Recommendations: Continue dual antiplatelet therapy for one year.  [name] MD,FACC  01/01/2013, 12:07 PM

## 2013-01-01 NOTE — H&P (View-Only) (Signed)
Jorge Lester Date of Birth: 10/13/1933 Medical Record #[id number]  History of Present Illness: Jorge Lester is seen today to discuss PCI. He presented with symptoms of progressive dyspnea on exertion. He had a nuclear stress test that showed reversible anterior, anterior apical, and inferior lateral defects. Ejection fraction 63%. Cardiac catheterization recently demonstrated total occlusion of the mid LAD. This is unchanged compared to 2008. He did have a favorable appearance for PCI with a well-defined proximal cap and short occlusion length. There actually appeared to be a channel through the occlusion. Since his heart catheterization he has had no complications. His radial site is without hematoma. He still complains of dyspnea on exertion.  Current Outpatient Prescriptions on File Prior to Visit  Medication Sig Dispense Refill  . aspirin 81 MG tablet Take 81 mg by mouth daily.        . carbidopa-levodopa (SINEMET CR) 50-200 MG per tablet Take 1 tablet by mouth 3 (three) times daily.      [name] doxazosin (CARDURA) 2 MG tablet Take 2 mg by mouth at bedtime.        . isosorbide mononitrate (IMDUR) 30 MG 24 hr tablet Take 1 tablet by mouth daily.      . metoprolol tartrate (LOPRESSOR) 25 MG tablet TAKE (1) TABLET TWICE DAILY.  60 tablet  3  . nitroGLYCERIN (NITROSTAT) 0.4 MG SL tablet Place 1 tablet (0.4 mg total) under the tongue every 5 (five) minutes as needed.  25 tablet  3  . ropinirole (REQUIP) 5 MG tablet Take 5 mg by mouth 3 (three) times daily.      . simvastatin (ZOCOR) 40 MG tablet Take 1 tablet (40 mg total) by mouth at bedtime.  30 tablet  5  . traZODone (DESYREL) 50 MG tablet Take 1 tablet by mouth daily.       No current facility-administered medications on file prior to visit.    Allergies  Allergen Reactions  . Sulfonamide Derivatives     Past Medical History  Diagnosis Date  . CAD (coronary artery disease)     Chronic 100% proximal LAD with distal collateralization,  treated medically, residual moderate CFX ad RCA disease, 05/2007  . Hyperlipidemia   . Hypertension   . Parkinson's disease   . Mitral regurgitation     Mild    Past Surgical History  Procedure Laterality Date  . Tonsillectomy    . Hand surgery      Bilateral  . Vasectomy      History  Smoking status  . Former Smoker  . Quit date: 07/16/1989  Smokeless tobacco  . Not on file    History  Alcohol Use No    Family History  Problem Relation Age of Onset  . Ovarian cancer Mother   . Coronary artery disease Father   . Alzheimer's disease Father   . Diabetes Other   . Transient ischemic attack Other     Review of Systems: The review of systems is positive for dyspnea on exertion. He also has Parkinson's symptoms. All other systems were reviewed and are negative.  Physical Exam: BP 110/54  Pulse 58  Ht 6' (1.829 m)  Wt 164 lb 12.8 oz (74.753 kg)  BMI 22.35 kg/m2  SpO2 98% Is a pleasant white male in no acute distress. HEENT: No masses, atraumatic. Pupils equal round and reactive to light accommodation. Extraocular movements are full. Oropharynx is clear. Neck: No carotid bruits. No JVD, adenopathy, thyromegaly Lungs: Clear Cardiovascular: Regular rate and  rhythm without gallop, murmur, or click. Abdomen: Soft and nontender. Bowel sounds are intact. No masses or bruits. Extremities: No cyanosis or edema. Radial and pedal pulses are 2+ and symmetric. Neuro: Alert and oriented x3. Cranial nerves II through XII are intact. Skin: Warm and dry  LABORATORY DATA: ECG today demonstrates normal sinus rhythm with PACs. Rate is 58 beats per minute. It is otherwise normal.  Assessment / Plan: 1. Coronary disease with class 2-3 angina (dyspnea) on 2 antianginal agents. Myoview study demonstrates significant anterior and apical ischemia with well-preserved LV function. He has chronic total occlusion of the mid LAD which appears favorable for revascularization with PCI. We  discussed the rationale for intervention. I did explain that I could not guarantee that this procedure would eliminate his dyspnea but there was significant evidence by his stress test that revascularization would help. We discussed the procedure in detail including potential for bilateral femoral access. The risks of the procedure were reviewed in detail and include but are not exclusive of myocardial infarction, bleeding, arrhythmia, perforation, radiation injury, contrast induced nephropathy, and CVA. Patient's questions were answered and he is agreeable to proceed. We will tentatively schedule this procedure for 01/01/2013. Patient will be started on Plavix 75 mg daily. I recommended switching his omeprazole to either Zantac or Pepcid. He will continue his other medications.

## 2013-01-02 ENCOUNTER — Encounter (HOSPITAL_COMMUNITY): Payer: Self-pay | Admitting: Physician Assistant

## 2013-01-02 DIAGNOSIS — I10 Essential (primary) hypertension: Secondary | ICD-10-CM

## 2013-01-02 DIAGNOSIS — E785 Hyperlipidemia, unspecified: Secondary | ICD-10-CM

## 2013-01-02 DIAGNOSIS — R0609 Other forms of dyspnea: Secondary | ICD-10-CM

## 2013-01-02 DIAGNOSIS — I2510 Atherosclerotic heart disease of native coronary artery without angina pectoris: Secondary | ICD-10-CM

## 2013-01-02 LAB — CBC
Platelets: 146 10*3/uL — ABNORMAL LOW (ref 150–400)
RBC: 4.07 MIL/uL — ABNORMAL LOW (ref 4.22–5.81)
WBC: 7.3 10*3/uL (ref 4.0–10.5)

## 2013-01-02 LAB — BASIC METABOLIC PANEL
CO2: 26 mEq/L (ref 19–32)
Chloride: 104 mEq/L (ref 96–112)
Sodium: 138 mEq/L (ref 135–145)

## 2013-01-02 NOTE — Progress Notes (Signed)
CARDIAC REHAB PHASE I   PRE:  Rate/Rhythm: 69 SR  BP:  Supine:   Sitting: 142/62  Standing:    SaO2:   MODE:  Ambulation: 1000 ft   POST:  Rate/Rhythm: 76 SR  BP:  Supine:   Sitting: 126/62  Standing:    SaO2:  0755-0905 Pt tolerated ambulation well. Has shuffling gait due to parkinson, but steady. VS stable. No c/o of cp or SOB with walking. Completed stent education with pt and wife. They voices understanding. Pt declines Outpt. CRP, not interested.   Melina Copa RN 01/02/2013 9:06 AM

## 2013-01-02 NOTE — Discharge Summary (Signed)
Discharge Summary   Patient ID: Jorge Lester MRN: [id number], DOB/AGE: December 24, 1933 79 y.o. Admit date: 01/01/2013 D/C date:     01/02/2013  Primary Cardiologist: Jorge Lester  Primary Discharge Diagnoses:  1. CAD s/p DESx2 to LAD for CTO 01/01/13 - residual nonobstructive disease in LCx, RCA demonstrated by diagnostic cath 12/02/12 2. HLD 3. HTN  Secondary Discharge Diagnoses:  1. Parkinson's disease 2. Mitral regurgitation, trivial by echo 11/2012 3. Sleep apnea 4. GERD 5. Gout  Hospital Course: Jorge Lester is a 79 y/o M with history of CAD, HTN, HL, Parkinson's disease who presented to [facility] 01/01/2013 for planned cath and PCI. He had recently presented with symptoms of progressive dyspnea on exertion. He had a nuclear stress test that showed reversible anterior, anterior apical, and inferior lateral defects; ejection fraction 63%. Cardiac catheterization recently demonstrated total occlusion of the mid LAD, unchanged compared to 2008. He did have a favorable appearance for PCI and this was considered. Dr. [place] did explain that he could not guarantee that this procedure would eliminate his dyspnea but there was significant evidence by his stress test that revascularization would help. The patient was started on Plavix 75mg  daily. He was brought in for this procedure yesterday and underwent successful stenting of the mid LAD for CTO with two drug-eluting stents. Recommendation is to continue ASA and Plavix for 1 year. He tolerated this procedure well. Dr. [place] has seen and examined him and feels he is stable for discharge today.   Discharge Vitals: Blood pressure 142/62, pulse 72, temperature 97.7 F (36.5 C), temperature source Oral, resp. rate 18, height 6' (1.829 m), weight 165 lb 5.5 oz (75 kg), SpO2 96.00%.  Labs: Lab Results  Component Value Date   WBC 7.3 01/02/2013   HGB 12.0* 01/02/2013   HCT 35.9* 01/02/2013   MCV 88.2 01/02/2013   PLT 146* 01/02/2013     Recent Labs Lab  01/02/13 0523  NA 138  K 4.2  CL 104  CO2 26  BUN 20  CREATININE 1.08  CALCIUM 8.6  GLUCOSE 106*    Diagnostic Studies/Procedures   Cardiac catheterization this admission, please see full report and above for summary.   Discharge Medications     Medication List    STOP taking these medications       omeprazole 20 MG capsule  Commonly known as:  PRILOSEC      TAKE these medications       aspirin 81 MG tablet  Take 81 mg by mouth daily.     carbidopa-levodopa 50-200 MG per tablet  Commonly known as:  SINEMET CR  Take 1 tablet by mouth 3 (three) times daily.     clopidogrel 75 MG tablet  Commonly known as:  PLAVIX  Take 1 tablet (75 mg total) by mouth daily.     doxazosin 2 MG tablet  Commonly known as:  CARDURA  Take 2 mg by mouth at bedtime.     famotidine 20 MG tablet  Commonly known as:  PEPCID  Take 1 tablet (20 mg) two times per day     isosorbide mononitrate 30 MG 24 hr tablet  Commonly known as:  IMDUR  Take 1 tablet by mouth daily.     metoprolol tartrate 25 MG tablet  Commonly known as:  LOPRESSOR  TAKE (1) TABLET TWICE DAILY.     nitroGLYCERIN 0.4 MG SL tablet  Commonly known as:  NITROSTAT  Place 1 tablet (0.4 mg total) under the tongue every 5 (  five) minutes as needed.     ropinirole 5 MG tablet  Commonly known as:  REQUIP  Take 5 mg by mouth 3 (three) times daily.     simvastatin 40 MG tablet  Commonly known as:  ZOCOR  Take 1 tablet (40 mg total) by mouth at bedtime.     traZODone 50 MG tablet  Commonly known as:  DESYREL  Take 1 tablet by mouth daily.        Disposition   The patient will be discharged in stable condition to home. Discharge Orders   Future Appointments Provider Department Dept Phone   01/14/2013 3:00 PM Jorge Spaniel, MD [facility] [phone]   01/22/2013 1:40 PM Jorge Parma, PA-C [facility] (near [place]) [phone]   Future Orders Complete By Expires     Diet - low  sodium heart healthy  As directed     Increase activity slowly  As directed     Comments:      No driving for 2 days. No lifting over 5 lbs for 1 week. No sexual activity for 1 week. Keep procedure site clean & dry. If you notice increased pain, swelling, bleeding or pus, call/return!  You may shower, but no soaking baths/hot tubs/pools for 1 week.      Follow-up Information   Follow up with SERPE, EUGENE, PA-C. (01/22/13 at 1:40pm)    Contact information:   [address] [place] [address] [phone] [facility] - [place]        Duration of Discharge Encounter: Greater than 30 minutes including physician and PA time.  Signed, Ronie Spies PA-C 01/02/2013, 9:24 AM

## 2013-01-02 NOTE — Progress Notes (Signed)
   TELEMETRY: Reviewed telemetry pt in NSR: Filed Vitals:   01/01/13 1557 01/01/13 2021 01/02/13 0024 01/02/13 0535  BP: 117/53 161/68 128/58 147/49  Pulse: 55 81 66 59  Temp: 97.7 F (36.5 C) 97.5 F (36.4 C) 98.3 F (36.8 C) 97.8 F (36.6 C)  TempSrc: Oral Oral Oral Oral  Resp: 16 18 20 17   Height:      Weight:   165 lb 5.5 oz (75 kg)   SpO2: 97% 95% 95% 96%    Intake/Output Summary (Last 24 hours) at 01/02/13 0744 Last data filed at 01/02/13 0537  Gross per 24 hour  Intake 1222.2 ml  Output   3000 ml  Net -1777.8 ml    SUBJECTIVE Denies any chest pain. Groins OK. Feels well.  LABS: Basic Metabolic Panel:  Recent Labs  16/10/96 0657 01/02/13 0523  NA 137 138  K 4.0 4.2  CL 103 104  CO2 26 26  GLUCOSE 107* 106*  BUN 26* 20  CREATININE 1.09 1.08  CALCIUM 8.8 8.6   CBC:  Recent Labs  01/01/13 0657 01/02/13 0523  WBC 6.9 7.3  HGB 12.6* 12.0*  HCT 37.5* 35.9*  MCV 88.2 88.2  PLT 150 146*   Radiology/Studies:  No results found.  Ecg: NSR with LVH. No acute change.  PHYSICAL EXAM General: Well developed, well nourished, in no acute distress. Head: Normal. Neck: Negative for carotid bruits. JVD not elevated. Lungs: Clear bilaterally to auscultation without wheezes, rales, or rhonchi. Breathing is unlabored. Heart: RRR S1 S2 without murmurs, rubs, or gallops.  Abdomen: Soft, non-tender, non-distended with normoactive bowel sounds.  Msk:  Strength and tone appears normal for age. Extremities: No clubbing, cyanosis or edema.  Distal pedal pulses are 2+ and equal bilaterally. Both groins look good with no hematoma. Neuro: Alert and oriented X 3. Moves all extremities spontaneously. Parkinson's gait. Psych:  Responds to questions appropriately with a normal affect.  ASSESSMENT AND PLAN: 1. CAD s/p stenting of LAD with DES for CTO. Continue ASA and Plavix for one year. OK for discharge today. Follow up in [place] office.  2.Hyperlipidemia. On statin. 3.  HTN controlled.  Principal Problem:   CAD, NATIVE VESSEL Active Problems:   PARKINSON'S DISEASE   CAD (coronary artery disease)   Hyperlipidemia   Hypertension    Signed, Peter [place] MD,FACC 01/02/2013 7:44 AM

## 2013-01-02 NOTE — Discharge Summary (Signed)
Patient seen and examined and history reviewed. Agree with above findings and plan. See earlier rounding note.   Jorge Lester 01/02/2013 3:42 PM

## 2013-01-14 ENCOUNTER — Encounter: Payer: Self-pay | Admitting: Neurology

## 2013-01-14 ENCOUNTER — Ambulatory Visit (INDEPENDENT_AMBULATORY_CARE_PROVIDER_SITE_OTHER): Payer: Medicare Other | Admitting: Neurology

## 2013-01-14 VITALS — BP 123/65 | HR 64 | Ht 71.0 in | Wt 164.0 lb

## 2013-01-14 DIAGNOSIS — R269 Unspecified abnormalities of gait and mobility: Secondary | ICD-10-CM

## 2013-01-14 DIAGNOSIS — G20 Parkinson's disease: Secondary | ICD-10-CM

## 2013-01-14 DIAGNOSIS — G20A1 Parkinson's disease without dyskinesia, without mention of fluctuations: Secondary | ICD-10-CM

## 2013-01-14 DIAGNOSIS — G4700 Insomnia, unspecified: Secondary | ICD-10-CM

## 2013-01-14 NOTE — Progress Notes (Signed)
Reason for visit: Parkinson's disease  Jorge Lester is an 79 y.o. male  History of present illness:  Jorge Lester is a 79 year old right-handed white male with a history of Parkinson's disease. The patient is on Sinemet and on Requip, and he is tolerating medications well. The patient has recently been in the hospital with coronary artery disease, and he required 2 stent placements. The patient is doing much better at this time. He was having shortness of breath prior to this procedure. The patient did not sustain a myocardial infarction. The patient indicates that he has been able to walk well, with no falls. Patient denies any problems with chewing or swallowing. The patient returns for an evaluation. His activity level has dropped off somewhat following the recent procedure, but he is getting back into his usual routine at this time.  Past Medical History  Diagnosis Date  . CAD (coronary artery disease)     a. CTO of LAD known since 2008, s/p DES x 2 to CTO 01/01/13. b. known residual nonobstructive disease in LCx, RCA.  [name] Hyperlipidemia   . Hypertension   . Parkinson's disease   . Mitral regurgitation     Prev mild, most recently trivial by echo 11/2012.  . Sleep apnea   . GERD (gastroesophageal reflux disease)   . Gout     Past Surgical History  Procedure Laterality Date  . Tonsillectomy and adenoidectomy  1947  . Trigger finger release  1980's  . Vasectomy    . Cardiac catheterization  2008  . Coronary angioplasty with stent placement  01/01/2013    "2" (01/01/2013)  . Lumbar disc surgery  1974    Family History  Problem Relation Age of Onset  . Ovarian cancer Mother   . Coronary artery disease Father   . Alzheimer's disease Father   . Diabetes Other   . Transient ischemic attack Other   . Diabetes Brother     Social history:  reports that he quit smoking about 23 years ago. His smoking use included Cigarettes. He has a 88 pack-year smoking history. He has never  used smokeless tobacco. He reports that he does not drink alcohol or use illicit drugs.  Allergies:  Allergies  Allergen Reactions  . Amantadines     Hallucinations  . Sulfonamide Derivatives     Medications:  Current Outpatient Prescriptions on File Prior to Visit  Medication Sig Dispense Refill  . aspirin 81 MG tablet Take 81 mg by mouth daily.        . carbidopa-levodopa (SINEMET CR) 50-200 MG per tablet Take 1 tablet by mouth 3 (three) times daily.      . clopidogrel (PLAVIX) 75 MG tablet Take 1 tablet (75 mg total) by mouth daily.  90 tablet  3  . doxazosin (CARDURA) 2 MG tablet Take 2 mg by mouth at bedtime.        . famotidine (PEPCID) 20 MG tablet Take 1 tablet (20 mg) two times per day  30 tablet  6  . isosorbide mononitrate (IMDUR) 30 MG 24 hr tablet Take 1 tablet by mouth daily.      . metoprolol tartrate (LOPRESSOR) 25 MG tablet TAKE (1) TABLET TWICE DAILY.  60 tablet  3  . nitroGLYCERIN (NITROSTAT) 0.4 MG SL tablet Place 1 tablet (0.4 mg total) under the tongue every 5 (five) minutes as needed.  25 tablet  3  . ropinirole (REQUIP) 5 MG tablet Take 5 mg by mouth 3 (three) times daily.      [name]  simvastatin (ZOCOR) 40 MG tablet Take 1 tablet (40 mg total) by mouth at bedtime.  30 tablet  5  . traZODone (DESYREL) 50 MG tablet Take 1 tablet by mouth daily.       No current facility-administered medications on file prior to visit.    ROS:  Out of a complete 14 system review of symptoms, the patient complains only of the following symptoms, and all other reviewed systems are negative.  Shortness of breath Tremor  Blood pressure 123/65, pulse 64, height 5\' 11"  (1.803 m), weight 164 lb (74.39 kg).  Physical Exam  General: The patient is alert and cooperative at the time of the examination.  Skin: No significant peripheral edema is noted.   Neurologic Exam  Cranial nerves: Facial symmetry is present. Speech is normal, no aphasia or dysarthria is noted. Extraocular  movements are full. Visual fields are full. Mild masking of the face is seen. A jaw tremor is seen intermittently.  Motor: The patient has good strength in all 4 extremities. The patient is able to arise from a seated position with the arms crossed.  Coordination: The patient has good finger-nose-finger and heel-to-shin bilaterally.  Gait and station: The patient has a normal gait. The patient has fair arm swing, good stride. Tandem gait is slightly unsteady. Romberg is negative. No drift is seen.  Reflexes: Deep tendon reflexes are symmetric.   Assessment/Plan:  One. Parkinson's disease  The patient is doing quite well at this time. The patient will remain on the Sinemet taking the 50/200 mg CR tablets 3 times daily. The patient will remain on Requip taking 5 mg 3 times daily. The patient followup in 6 months. The patient is functioning quite well currently.  Marlan Palau MD 01/14/2013 9:08 PM  [facility] [address] [place], [place] [address]  Phone [phone] Fax [phone]

## 2013-01-20 ENCOUNTER — Encounter: Payer: Self-pay | Admitting: Cardiology

## 2013-01-22 ENCOUNTER — Other Ambulatory Visit: Payer: Self-pay | Admitting: Physician Assistant

## 2013-01-22 ENCOUNTER — Ambulatory Visit (INDEPENDENT_AMBULATORY_CARE_PROVIDER_SITE_OTHER): Payer: Medicare Other | Admitting: Physician Assistant

## 2013-01-22 ENCOUNTER — Encounter: Payer: Self-pay | Admitting: Physician Assistant

## 2013-01-22 VITALS — BP 138/70 | HR 60 | Ht 72.0 in | Wt 163.4 lb

## 2013-01-22 DIAGNOSIS — E785 Hyperlipidemia, unspecified: Secondary | ICD-10-CM

## 2013-01-22 DIAGNOSIS — I2510 Atherosclerotic heart disease of native coronary artery without angina pectoris: Secondary | ICD-10-CM

## 2013-01-22 DIAGNOSIS — I10 Essential (primary) hypertension: Secondary | ICD-10-CM

## 2013-01-22 NOTE — Assessment & Plan Note (Signed)
Symptomatic improvement, status post recent DES (2) of known 100% mid LAD. Patient understands that he is to remain on DAPT for at least 1 year. Will DC Imdur, in the absence of any significant residual disease.

## 2013-01-22 NOTE — Assessment & Plan Note (Signed)
Very well controlled, followed by primary M.D. Most recent LDL 67, 10/2012. Continue current dose simvastatin with target LDL 70 or less, if feasible.

## 2013-01-22 NOTE — Assessment & Plan Note (Signed)
Well-controlled on current medication regimen 

## 2013-01-22 NOTE — Patient Instructions (Signed)
   Stop Imdur (Isosorbide)   Continue all other current medications. Follow up in  3 months

## 2013-01-22 NOTE — Progress Notes (Signed)
Primary Cardiologist: Rollene Rotunda, MD   HPI: Post hospital followup from [facility], status post recent elective PCI with DES (2) of known 100% chronically occluded mid LAD (2008).  This was following recent diagnostic cardiac catheterization, arranged when patient last seen here in our [facility] clinic on May 15, which revealed 1v CAD as outlined above, with residual nonobstructive CFX and RCA disease; EF 55-65%   He returns today reporting overall improvement in his exercise tolerance level. He is now able to walk his usual 1 mile without having to stop 2 or 3 times. He also reports no associated chest tightness, as he had experienced on occasion in the past.  He denies any complications of the bilateral groin incision sites.  Allergies  Allergen Reactions  . Amantadines     Hallucinations  . Sulfonamide Derivatives     Current Outpatient Prescriptions  Medication Sig Dispense Refill  . aspirin 81 MG tablet Take 81 mg by mouth daily.        . carbidopa-levodopa (SINEMET CR) 50-200 MG per tablet Take 1 tablet by mouth 3 (three) times daily.      . clopidogrel (PLAVIX) 75 MG tablet Take 1 tablet (75 mg total) by mouth daily.  90 tablet  3  . doxazosin (CARDURA) 2 MG tablet Take 2 mg by mouth at bedtime.        . famotidine (PEPCID) 20 MG tablet Take 1 tablet (20 mg) two times per day  30 tablet  6  . metoprolol tartrate (LOPRESSOR) 25 MG tablet TAKE (1) TABLET TWICE DAILY.  60 tablet  3  . nitroGLYCERIN (NITROSTAT) 0.4 MG SL tablet Place 1 tablet (0.4 mg total) under the tongue every 5 (five) minutes as needed.  25 tablet  3  . ropinirole (REQUIP) 5 MG tablet Take 5 mg by mouth 3 (three) times daily.      . simvastatin (ZOCOR) 40 MG tablet Take 1 tablet (40 mg total) by mouth at bedtime.  30 tablet  5  . traZODone (DESYREL) 50 MG tablet Take 1 tablet by mouth daily.       No current facility-administered medications for this visit.    Past Medical History  Diagnosis Date  . CAD (coronary  artery disease)     a. CTO of LAD known since 2008, s/p DES x 2 to CTO 01/01/13. b. known residual nonobstructive disease in LCx, RCA.  [name] Hyperlipidemia   . Hypertension   . Parkinson's disease   . Mitral regurgitation     Prev mild, most recently trivial by echo 11/2012.  . Sleep apnea   . GERD (gastroesophageal reflux disease)   . Gout     Past Surgical History  Procedure Laterality Date  . Tonsillectomy and adenoidectomy  1947  . Trigger finger release  1980's  . Vasectomy    . Cardiac catheterization  2008  . Coronary angioplasty with stent placement  01/01/2013    "2" (01/01/2013)  . Lumbar disc surgery  1974    History   Social History  . Marital Status: Married    Spouse Name: N/A    Number of Children: 1  . Years of Education: 12   Occupational History  . Retired    Social History Main Topics  . Smoking status: Former Smoker -- 2.00 packs/day for 44 years    Types: Cigarettes    Quit date: 07/16/1989  . Smokeless tobacco: Never Used  . Alcohol Use: No     Comment: 01/01/2013 "last alcohol  was 1983"  . Drug Use: No  . Sexually Active: Yes   Other Topics Concern  . Not on file   Social History Narrative  . No narrative on file    Family History  Problem Relation Age of Onset  . Ovarian cancer Mother   . Coronary artery disease Father   . Alzheimer's disease Father   . Diabetes Other   . Transient ischemic attack Other   . Diabetes Brother     ROS: no nausea, vomiting; no fever, chills; no melena, hematochezia; no claudication  PHYSICAL EXAM: BP 138/70  Pulse 60  Ht 6' (1.829 m)  Wt 163 lb 6.4 oz (74.118 kg)  BMI 22.16 kg/m2 GENERAL: 79 year old male; NAD  HEENT: NCAT, PERRLA, EOMI; sclera clear; no xanthelasma  NECK: palpable bilateral carotid pulses, no bruits; no JVD; no TM  LUNGS: diminished breath sounds  CARDIAC: RRR (S1, S2); no significant murmurs; no rubs or gallops  ABDOMEN: soft, non-tender; intact BS  EXTREMETIES: no significant  peripheral edema  SKIN: warm/dry; no obvious rash/lesions  MUSCULOSKELETAL: no joint deformity  NEURO: no focal deficit; NL affect    EKG:    ASSESSMENT & PLAN:  CAD (coronary artery disease) Symptomatic improvement, status post recent DES (2) of known 100% mid LAD. Patient understands that he is to remain on DAPT for at least 1 year. Will DC Imdur, in the absence of any significant residual disease.  Hyperlipidemia Very well controlled, followed by primary M.D. Most recent LDL 67, 10/2012. Continue current dose simvastatin with target LDL 70 or less, if feasible.  HYPERTENSION Well-controlled on current medication regimen    Gene Charlot Gouin, PAC

## 2013-02-13 ENCOUNTER — Other Ambulatory Visit: Payer: Self-pay | Admitting: Neurology

## 2013-02-24 ENCOUNTER — Telehealth: Payer: Self-pay | Admitting: Cardiology

## 2013-02-24 NOTE — Telephone Encounter (Signed)
Patient does not feel he needs to be seen in October will call when he feels he needs to be seen

## 2013-03-09 ENCOUNTER — Other Ambulatory Visit: Payer: Self-pay | Admitting: Cardiology

## 2013-05-12 ENCOUNTER — Ambulatory Visit (INDEPENDENT_AMBULATORY_CARE_PROVIDER_SITE_OTHER): Payer: Medicare Other | Admitting: Cardiology

## 2013-05-12 ENCOUNTER — Encounter: Payer: Self-pay | Admitting: Cardiology

## 2013-05-12 VITALS — BP 156/78 | HR 62 | Ht 72.0 in | Wt 164.0 lb

## 2013-05-12 DIAGNOSIS — I10 Essential (primary) hypertension: Secondary | ICD-10-CM

## 2013-05-12 MED ORDER — LISINOPRIL 5 MG PO TABS: 5.0000 mg | ORAL_TABLET | Freq: Every day | ORAL | Status: DC

## 2013-05-12 NOTE — Patient Instructions (Signed)
Your physician recommends that you schedule a follow-up appointment in: June 2015 with Dr. Wyline Mood. You should receive a letter in the mail in April 2015 months. If you do not receive this letter by April 2015 call our office to schedule this appointment.   Your physician has recommended you make the following change in your medication:  Start: Lisinopril 5 MG once daily  Your physician recommends that you return for lab work in: 3 weeks (Around 06-02-2013) for BMET.  You can go to the following locations to get lab work done: [facility] [address] Dr. Lysbeth Galas office in [place] [facility].

## 2013-05-12 NOTE — Progress Notes (Signed)
Clinical Summary Jorge Lester is a 79 y.o.male seen today for foll 1. CAD - recent PCI to LAD CTO 12/2012 with DES x2  - 11/2012 echo LVEF 55-60%,    - denies any chest pain. States SOB has improved. Notes he can complete yard work like tree removal now. No orthopnea, no PND, no LE edema. - compliant with meds: ASA 81, plavix, metoprolol, simva 40.  2. HTN - does not check at home - compliant with meds   3. HL - compliant with simva - 10/2012 panel: TC 141 TG 90 HDL 56 LDL 67  Past Medical History  Diagnosis Date  . CAD (coronary artery disease)     a. CTO of LAD known since 2008, s/p DES x 2 to CTO 01/01/13. b. known residual nonobstructive disease in LCx, RCA.  [name] Hyperlipidemia   . Hypertension   . Parkinson's disease   . Mitral regurgitation     Prev mild, most recently trivial by echo 11/2012.  . Sleep apnea   . GERD (gastroesophageal reflux disease)   . Gout      Allergies  Allergen Reactions  . Amantadines     Hallucinations  . Sulfonamide Derivatives      Current Outpatient Prescriptions  Medication Sig Dispense Refill  . aspirin 81 MG tablet Take 81 mg by mouth daily.        . carbidopa-levodopa (SINEMET CR) 50-200 MG per tablet TAKE 1 TABLET BY MOUTH 3 TIMES DAILY.  90 tablet  3  . clopidogrel (PLAVIX) 75 MG tablet Take 1 tablet (75 mg total) by mouth daily.  90 tablet  3  . doxazosin (CARDURA) 2 MG tablet Take 2 mg by mouth at bedtime.        . famotidine (PEPCID) 20 MG tablet TAKE (1) TABLET TWICE DAILY.  30 tablet  6  . metoprolol tartrate (LOPRESSOR) 25 MG tablet TAKE (1) TABLET TWICE DAILY.  60 tablet  3  . nitroGLYCERIN (NITROSTAT) 0.4 MG SL tablet Place 1 tablet (0.4 mg total) under the tongue every 5 (five) minutes as needed.  25 tablet  3  . ropinirole (REQUIP) 5 MG tablet TAKE 1 TABLET BY MOUTH 3 TIMES DAILY.  90 tablet  3  . simvastatin (ZOCOR) 40 MG tablet Take 1 tablet (40 mg total) by mouth at bedtime.  30 tablet  5  . traZODone (DESYREL) 50  MG tablet Take 1 tablet by mouth daily.       No current facility-administered medications for this visit.     Past Surgical History  Procedure Laterality Date  . Tonsillectomy and adenoidectomy  1947  . Trigger finger release  1980's  . Vasectomy    . Cardiac catheterization  2008  . Coronary angioplasty with stent placement  01/01/2013    "2" (01/01/2013)  . Lumbar disc surgery  1974     Allergies  Allergen Reactions  . Amantadines     Hallucinations  . Sulfonamide Derivatives       Family History  Problem Relation Age of Onset  . Ovarian cancer Mother   . Coronary artery disease Father   . Alzheimer's disease Father   . Diabetes Other   . Transient ischemic attack Other   . Diabetes Brother      Social History Jorge Lester reports that he quit smoking about 23 years ago. His smoking use included Cigarettes. He has a 88 pack-year smoking history. He has never used smokeless tobacco. Jorge Lester reports that  he does not drink alcohol.   Review of Systems CONSTITUTIONAL: No weight loss, fever, chills, weakness or fatigue.  HEENT: Eyes: No visual loss, blurred vision, double vision or yellow sclerae.No hearing loss, sneezing, congestion, runny nose or sore throat.  SKIN: No rash or itching.  CARDIOVASCULAR: per HPI RESPIRATORY: per HPI  GASTROINTESTINAL: No anorexia, nausea, vomiting or diarrhea. No abdominal pain or blood.  GENITOURINARY: No burning on urination, no polyuria NEUROLOGICAL: No headache, dizziness, syncope, paralysis, ataxia, numbness or tingling in the extremities. No change in bowel or bladder control.  MUSCULOSKELETAL: No muscle, back pain, joint pain or stiffness.  LYMPHATICS: No enlarged nodes. No history of splenectomy.  PSYCHIATRIC: No history of depression or anxiety.  ENDOCRINOLOGIC: No reports of sweating, cold or heat intolerance. No polyuria or polydipsia.  [name]   Physical Examination p 62 bp 150/70 Wt 164 lbs BMI 22 Gen: resting  comfortably, no acute distress HEENT: no scleral icterus, pupils equal round and reactive, no palptable cervical adenopathy,  CV: RRR, no m/r/g, no JVD, no carotid bruits Resp: Clear to auscultation bilaterally GI: abdomen is soft, non-tender, non-distended, normal bowel sounds, no hepatosplenomegaly MSK: extremities are warm, no edema.  Skin: warm, no rash Neuro:  no focal deficits Psych: appropriate affect   Diagnostic Studies 11/2012 [facility] Procedural Findings:  Hemodynamics:  AO 117/52 with a mean of 79 mmHg  LV 116/14 mmHg  Coronary angiography:  Coronary dominance: right  Left mainstem: The left main coronary is normal.  Left anterior descending (LAD): The left anterior descending artery is diffusely diseased in the proximal vessel up to 75%. It is occluded in the mid vessel. There are left to left collaterals from the ramus intermediate Cai Anfinson. There are also right-to-left collaterals via septal perforators to the distal LAD. The proximal Is well-defined and there appears to be a small tract through the occlusion.  There is a ramus intermediate Willson Lipa which is moderate to large. It has mild irregularities less than 20%.  Left circumflex (LCx): The left circumflex gives rise to a single bifurcating marginal Deneane Stifter. The proximal OM has a segmental 60% stenosis.  Right coronary artery (RCA): The right coronary is a large dominant vessel. It is diffusely diseased throughout with stenosis up to 40% in the mid vessel. There is significant plaque burden throughout without significant stenosis.  Left ventriculography: Left ventricular systolic function is normal, LVEF is estimated at 55-65%, there is no significant mitral regurgitation  Final Conclusions:  1. Single vessel occlusive coronary disease with chronic total occlusion of the mid LAD.  2. Normal left ventricular function.  Recommendations: The patient has significant symptoms and ischemia on noninvasive testing despite 2 antianginal  agents. His LAD CTO appears suitable for PCI. The alternative would be to increase his antianginal therapy. I will discuss options with the patient.   01/01/13 Lesion Data:  Vessel: LAD  Percent stenosis (pre): 100%  TIMI-flow (pre): 0  Stent: 2.5 x 38 and 2.75 x 12 mm Promus premier stents  Percent stenosis (post): 0%  TIMI-flow (post): 3  Conclusions:  Successful stenting of the mid LAD for CTO with drug-eluting stents.  11/2012 Echo LVEF 55-60%, grade II diastolic dysfunction,     Assessment and Plan  1. CAD -patient s/p PCI to LAD CTO in June 2014, denies any chest pain. SOB is improving - continue plavix at least until 12/2013 - continue all other medications, continue risk factor modification  2. HTN - elevated in clinic today. Will start lisinopril for better blood  pressure control and cardiovascular outcome benefit in patients with known CAD - checks BMET 3 weeks after starting lisinopril  3. Hyperlipidemia - managed by his primary care doctor, his LDL is at goal - consider changing to high dose statin (lipitor 80mg  or crestor 20mg ) based on newest lipid guidelines given his known history of CAD.       Antoine Poche, M.D., F.A.C.C.

## 2013-05-29 ENCOUNTER — Telehealth: Payer: Self-pay | Admitting: Cardiology

## 2013-06-01 NOTE — Telephone Encounter (Signed)
It is ok if he stopped his lisinopril because of low blood pressures. His blood pressure of 116/58 is fine, anything above 100/50 I'm okay with. If his blood pressures are consistently low we may need to decrease his metoprolol. Please have him keep a blood pressure log and bring with him next appointment.   Dina Rich MD

## 2013-06-01 NOTE — Telephone Encounter (Signed)
Attempted to call pt home phone was busy and no answer on cell

## 2013-06-01 NOTE — Telephone Encounter (Signed)
His next appointment is in June. Do you want one sooner?

## 2013-06-01 NOTE — Telephone Encounter (Signed)
Please have him come in for a nurse visit blood pressure check next week.

## 2013-06-02 NOTE — Telephone Encounter (Signed)
LM with pt wife for pt to call office back.

## 2013-06-02 NOTE — Telephone Encounter (Signed)
Informed pt that it was ok to stop lisinopril. Informed pt to take his blood pressure and write it down and bring it with him to next appointment. Scheduled pt for a BP check with Nurse on Tuesday 11-25 @ 9:15. Pt verbalized understanding.

## 2013-06-09 ENCOUNTER — Ambulatory Visit (INDEPENDENT_AMBULATORY_CARE_PROVIDER_SITE_OTHER): Payer: Medicare Other | Admitting: *Deleted

## 2013-06-09 VITALS — BP 134/73 | HR 55

## 2013-06-09 DIAGNOSIS — I10 Essential (primary) hypertension: Secondary | ICD-10-CM

## 2013-06-09 NOTE — Progress Notes (Signed)
Patient in office this morning for BP / HR check.  See list of readings below - all done at [facility] -   11/24 - 108/57  60 11/22 -  99/56  70 11/21 - 118/61  72 11/20 - 100/53  68 11/19 - 134/66  62 11/18 - 111/64  61 11/17 - 121/56  60 11/15 - 149/69  66 11/14 - 116/58  65 11/13 - 138/67  65 11/12 - 106/53  64 11/12 -  99/52  54  This morning before coming here - 129/65  63.

## 2013-06-12 ENCOUNTER — Other Ambulatory Visit: Payer: Self-pay | Admitting: Neurology

## 2013-07-02 ENCOUNTER — Other Ambulatory Visit: Payer: Self-pay | Admitting: Cardiology

## 2013-07-06 ENCOUNTER — Other Ambulatory Visit: Payer: Self-pay | Admitting: Cardiology

## 2013-07-07 ENCOUNTER — Other Ambulatory Visit: Payer: Self-pay | Admitting: Cardiology

## 2013-07-11 ENCOUNTER — Other Ambulatory Visit: Payer: Self-pay | Admitting: Neurology

## 2013-08-03 ENCOUNTER — Ambulatory Visit (INDEPENDENT_AMBULATORY_CARE_PROVIDER_SITE_OTHER): Payer: Medicare Other | Admitting: Neurology

## 2013-08-03 ENCOUNTER — Encounter (INDEPENDENT_AMBULATORY_CARE_PROVIDER_SITE_OTHER): Payer: Self-pay

## 2013-08-03 ENCOUNTER — Encounter: Payer: Self-pay | Admitting: Neurology

## 2013-08-03 VITALS — BP 140/76 | HR 64 | Wt 157.0 lb

## 2013-08-03 DIAGNOSIS — R269 Unspecified abnormalities of gait and mobility: Secondary | ICD-10-CM

## 2013-08-03 DIAGNOSIS — G20 Parkinson's disease: Secondary | ICD-10-CM

## 2013-08-03 MED ORDER — CARBIDOPA-LEVODOPA ER 50-200 MG PO TBCR: EXTENDED_RELEASE_TABLET | ORAL | Status: DC

## 2013-08-03 NOTE — Patient Instructions (Signed)

## 2013-08-03 NOTE — Progress Notes (Signed)
Reason for visit: Parkinson's disease  Jorge Lester is an 80 y.o. male  History of present illness:  Jorge Lester is a 80 year old gentleman, right-handed, with a history of Parkinson's disease. The patient has been on Sinemet CR taking the 50/200 mg tablets 3 times daily, and he is on Requip taking 5 mg 3 times daily. The patient had been doing quite well until 2 months ago when he began having significant problems with dyskinesias. The patient indicates that as the day goes on, the dyskinesias worsen, and he has difficulty functioning and walking because of this. The patient has not had any falls. The patient denies any problems with swallowing or choking. The patient has noted that his Sinemet CR tablets changed in color 3 months ago. The patient denies any other new medical issues that have come up since last seen.  Past Medical History  Diagnosis Date  . CAD (coronary artery disease)     a. CTO of LAD known since 2008, s/p DES x 2 to CTO 01/01/13. b. known residual nonobstructive disease in LCx, RCA.  [name]. Hyperlipidemia   . Hypertension   . Parkinson's disease   . Mitral regurgitation     Prev mild, most recently trivial by echo 11/2012.  . Sleep apnea   . GERD (gastroesophageal reflux disease)   . Gout     Past Surgical History  Procedure Laterality Date  . Tonsillectomy and adenoidectomy  1947  . Trigger finger release  1980's  . Vasectomy    . Cardiac catheterization  2008  . Coronary angioplasty with stent placement  01/01/2013    "2" (01/01/2013)  . Lumbar disc surgery  1974    Family History  Problem Relation Age of Onset  . Ovarian cancer Mother   . Coronary artery disease Father   . Alzheimer's disease Father   . Diabetes Other   . Transient ischemic attack Other   . Diabetes Brother     Social history:  reports that he quit smoking about 24 years ago. His smoking use included Cigarettes. He has a 88 pack-year smoking history. He has never used smokeless  tobacco. He reports that he does not drink alcohol or use illicit drugs.    Allergies  Allergen Reactions  . Amantadines     Hallucinations  . Sulfonamide Derivatives     Medications:  Current Outpatient Prescriptions on File Prior to Visit  Medication Sig Dispense Refill  . aspirin 81 MG tablet Take 81 mg by mouth daily.        . clopidogrel (PLAVIX) 75 MG tablet TAKE 1 TABLET ONCE DAILY.  30 tablet  6  . doxazosin (CARDURA) 2 MG tablet Take 2 mg by mouth at bedtime.        . famotidine (PEPCID) 20 MG tablet TAKE (1) TABLET TWICE DAILY.  60 tablet  0  . metoprolol tartrate (LOPRESSOR) 25 MG tablet TAKE (1) TABLET TWICE DAILY.  60 tablet  5  . nitroGLYCERIN (NITROSTAT) 0.4 MG SL tablet Place 1 tablet (0.4 mg total) under the tongue every 5 (five) minutes as needed.  25 tablet  3  . ropinirole (REQUIP) 5 MG tablet TAKE 1 TABLET BY MOUTH 3 TIMES DAILY.  90 tablet  3  . simvastatin (ZOCOR) 40 MG tablet Take 1 tablet (40 mg total) by mouth at bedtime.  30 tablet  5  . traZODone (DESYREL) 50 MG tablet Take 1 tablet by mouth daily.       No current  facility-administered medications on file prior to visit.    ROS:  Out of a complete 14 system review of symptoms, the patient complains only of the following symptoms, and all other reviewed systems are negative.  Walking problems, tremors  Blood pressure 140/76, pulse 64, weight 157 lb (71.215 kg).  Physical Exam  General: The patient is alert and cooperative at the time of the examination.  Skin: No significant peripheral edema is noted.   Neurologic Exam  Mental status: The patient is oriented x 3.  Cranial nerves: Facial symmetry is present. Speech is normal, no aphasia or dysarthria is noted. Extraocular movements are full. Visual fields are full.  Motor: The patient has good strength in all 4 extremities.  Sensory examination: Soft touch sensation on the face, arms, and legs is symmetric.  Coordination: The patient has  good finger-nose-finger and heel-to-shin bilaterally. Minimal dyskinesias affecting the head and neck is noted.  Gait and station: The patient has a normal gait. The patient is able to arise from a seated position with arms crossed. Tandem gait is normal. Romberg is negative. No drift is seen.  Reflexes: Deep tendon reflexes are symmetric.   Assessment/Plan:  1. Parkinson's disease  The patient has had a change in the generic manufactures his Sinemet CR. This has resulted in a significant increase in the dyskinesias. The patient will need to cut back on his medication. The patient will take one full tablet of Sinemet CR 50/200 mg in the morning and evening, one half at midday. Within the next 3 weeks if he is still having issues, they are to contact our office, and we will continue to drop the dose of Sinemet. The patient will otherwise followup in 4 months.  Marlan Palau MD 08/03/2013 7:09 PM  [facility] [address] [place], [place] [address]  Phone [phone] Fax [phone]

## 2013-08-04 ENCOUNTER — Telehealth: Payer: Self-pay | Admitting: Cardiology

## 2013-08-04 MED ORDER — SIMVASTATIN 40 MG PO TABS: 40.0000 mg | ORAL_TABLET | Freq: Every day | ORAL | Status: DC

## 2013-08-04 NOTE — Telephone Encounter (Signed)
[facility] SIMVASTIN 40MG 

## 2013-08-10 ENCOUNTER — Other Ambulatory Visit: Payer: Self-pay | Admitting: Cardiology

## 2013-09-07 ENCOUNTER — Telehealth: Payer: Self-pay | Admitting: Neurology

## 2013-09-07 NOTE — Telephone Encounter (Signed)
Information noted, I did not called patient back. The patient is doing better with the reduced dose of the Sinemet.

## 2013-09-07 NOTE — Telephone Encounter (Signed)
Patient calling to state that he is doing well on his reduced dosage of carbidopa levodopa and wanted to make Dr. Anne HahnWillis aware.

## 2013-09-15 ENCOUNTER — Other Ambulatory Visit: Payer: Self-pay | Admitting: Neurology

## 2013-11-12 ENCOUNTER — Other Ambulatory Visit: Payer: Self-pay | Admitting: Neurology

## 2013-12-28 ENCOUNTER — Other Ambulatory Visit: Payer: Self-pay | Admitting: Cardiology

## 2014-01-07 ENCOUNTER — Encounter: Payer: Self-pay | Admitting: Cardiology

## 2014-01-07 ENCOUNTER — Ambulatory Visit (INDEPENDENT_AMBULATORY_CARE_PROVIDER_SITE_OTHER): Payer: Medicare Other | Admitting: Cardiology

## 2014-01-07 VITALS — BP 130/75 | HR 58 | Ht 72.0 in | Wt 157.0 lb

## 2014-01-07 DIAGNOSIS — I2510 Atherosclerotic heart disease of native coronary artery without angina pectoris: Secondary | ICD-10-CM

## 2014-01-07 DIAGNOSIS — I10 Essential (primary) hypertension: Secondary | ICD-10-CM

## 2014-01-07 DIAGNOSIS — I059 Rheumatic mitral valve disease, unspecified: Secondary | ICD-10-CM

## 2014-01-07 NOTE — Patient Instructions (Signed)
   Stop Plavix. Continue all other medications.   Your physician wants you to follow up in:  1 year.  You will receive a reminder letter in the mail one-two months in advance.  If you don't receive a letter, please call our office to schedule the follow up appointment   

## 2014-01-07 NOTE — Progress Notes (Signed)
Clinical Summary Jorge Lester is a 80 y.o.male seen today for follow up of the following medical problems.   1. CAD  - PCI to LAD CTO 12/2012 with DES x2  - 11/2012 echo LVEF 55-60%,  - denies any chest pain. States SOB has improved. Notes he can complete yard work like tree removal now. No orthopnea, no PND, no LE edema.  - compliant with meds.   2. HTN  - does not check at home  - compliant with meds   3. HL  - compliant with simva  - last panel 11/2013: HDL 49 LDL 65  Past Medical History  Diagnosis Date  . CAD (coronary artery disease)     a. CTO of LAD known since 2008, s/p DES x 2 to CTO 01/01/13. b. known residual nonobstructive disease in LCx, RCA.  [name] Hyperlipidemia   . Hypertension   . Parkinson's disease   . Mitral regurgitation     Prev mild, most recently trivial by echo 11/2012.  . Sleep apnea   . GERD (gastroesophageal reflux disease)   . Gout      Allergies  Allergen Reactions  . Amantadines     Hallucinations  . Sulfonamide Derivatives      Current Outpatient Prescriptions  Medication Sig Dispense Refill  . aspirin 81 MG tablet Take 81 mg by mouth daily.        . carbidopa-levodopa (SINEMET CR) 50-200 MG per tablet One tablet at in the morning and evening, one half tablet at lunch  90 tablet  1  . carbidopa-levodopa (SINEMET CR) 50-200 MG per tablet One tablet at in the morning and evening, one half tablet at lunch  75 tablet  6  . clopidogrel (PLAVIX) 75 MG tablet TAKE 1 TABLET ONCE DAILY.  30 tablet  6  . doxazosin (CARDURA) 2 MG tablet Take 2 mg by mouth at bedtime.        . famotidine (PEPCID) 20 MG tablet TAKE (1) TABLET TWICE DAILY.  60 tablet  6  . metoprolol tartrate (LOPRESSOR) 25 MG tablet TAKE (1) TABLET TWICE DAILY.  60 tablet  3  . nitroGLYCERIN (NITROSTAT) 0.4 MG SL tablet Place 1 tablet (0.4 mg total) under the tongue every 5 (five) minutes as needed.  25 tablet  3  . ropinirole (REQUIP) 5 MG tablet TAKE 1 TABLET BY MOUTH 3 TIMES  DAILY.  90 tablet  2  . simvastatin (ZOCOR) 40 MG tablet Take 1 tablet (40 mg total) by mouth at bedtime.  30 tablet  5  . traZODone (DESYREL) 50 MG tablet Take 1 tablet by mouth daily.       No current facility-administered medications for this visit.     Past Surgical History  Procedure Laterality Date  . Tonsillectomy and adenoidectomy  1947  . Trigger finger release  1980's  . Vasectomy    . Cardiac catheterization  2008  . Coronary angioplasty with stent placement  01/01/2013    "2" (01/01/2013)  . Lumbar disc surgery  1974     Allergies  Allergen Reactions  . Amantadines     Hallucinations  . Sulfonamide Derivatives       Family History  Problem Relation Age of Onset  . Ovarian cancer Mother   . Coronary artery disease Father   . Alzheimer's disease Father   . Diabetes Other   . Transient ischemic attack Other   . Diabetes Brother      Social History Jorge Lester  reports that he quit smoking about 24 years ago. His smoking use included Cigarettes. He has a 88 pack-year smoking history. He has never used smokeless tobacco. Jorge Lester reports that he does not drink alcohol.   Review of Systems CONSTITUTIONAL: No weight loss, fever, chills, weakness or fatigue.  HEENT: Eyes: No visual loss, blurred vision, double vision or yellow sclerae.No hearing loss, sneezing, congestion, runny nose or sore throat.  SKIN: No rash or itching.  CARDIOVASCULAR: per HPI RESPIRATORY: No shortness of breath, cough or sputum.  GASTROINTESTINAL: No anorexia, nausea, vomiting or diarrhea. No abdominal pain or blood.  GENITOURINARY: No burning on urination, no polyuria NEUROLOGICAL: No headache, dizziness, syncope, paralysis, ataxia, numbness or tingling in the extremities. No change in bowel or bladder control.  MUSCULOSKELETAL: No muscle, back pain, joint pain or stiffness.  LYMPHATICS: No enlarged nodes. No history of splenectomy.  PSYCHIATRIC: No history of depression or anxiety.    ENDOCRINOLOGIC: No reports of sweating, cold or heat intolerance. No polyuria or polydipsia.  [name].   Physical Examination p 58 bp 130/75 Wt 157 lbs BMI 21 Gen: resting comfortably, no acute distress HEENT: no scleral icterus, pupils equal round and reactive, no palptable cervical adenopathy,  CV: RRR, no m/r/g, no JVD, no carotid bruits Resp: Clear to auscultation bilaterally GI: abdomen is soft, non-tender, non-distended, normal bowel sounds, no hepatosplenomegaly MSK: extremities are warm, no edema.  Skin: warm, no rash Neuro:  no focal deficits Psych: appropriate affect   Diagnostic Studies 11/2012 Cath  Procedural Findings:  Hemodynamics:  AO 117/52 with a mean of 79 mmHg  LV 116/14 mmHg  Coronary angiography:  Coronary dominance: right  Left mainstem: The left main coronary is normal.  Left anterior descending (LAD): The left anterior descending artery is diffusely diseased in the proximal vessel up to 75%. It is occluded in the mid vessel. There are left to left collaterals from the ramus intermediate branch. There are also right-to-left collaterals via septal perforators to the distal LAD. The proximal Is well-defined and there appears to be a small tract through the occlusion.  There is a ramus intermediate branch which is moderate to large. It has mild irregularities less than 20%.  Left circumflex (LCx): The left circumflex gives rise to a single bifurcating marginal branch. The proximal OM has a segmental 60% stenosis.  Right coronary artery (RCA): The right coronary is a large dominant vessel. It is diffusely diseased throughout with stenosis up to 40% in the mid vessel. There is significant plaque burden throughout without significant stenosis.  Left ventriculography: Left ventricular systolic function is normal, LVEF is estimated at 55-65%, there is no significant mitral regurgitation  Final Conclusions:  1. Single vessel occlusive coronary disease with chronic total  occlusion of the mid LAD.  2. Normal left ventricular function.  Recommendations: The patient has significant symptoms and ischemia on noninvasive testing despite 2 antianginal agents. His LAD CTO appears suitable for PCI. The alternative would be to increase his antianginal therapy. I will discuss options with the patient.   01/01/13  Lesion Data:  Vessel: LAD  Percent stenosis (pre): 100%  TIMI-flow (pre): 0  Stent: 2.5 x 38 and 2.75 x 12 mm Promus premier stents  Percent stenosis (post): 0%  TIMI-flow (post): 3  Conclusions:  Successful stenting of the mid LAD for CTO with drug-eluting stents.   11/2012 Echo  LVEF 55-60%, grade II diastolic dysfunction,        Assessment and Plan  1. CAD  -patient  s/p DES to LAD CTO in June 2014, denies any recent symptoms - he has completed 1 year of plavix, will stop - continue all other medications, continue risk factor modification   2. HTN  - at goal, continue current meds   3. Hyperlipidemia  - managed by his primary care doctor, his LDL is at goal  - consider changing to high dose statin (lipitor 80mg  or crestor 20mg ) based on newest lipid guidelines given his known history of CAD.    F/u 1year    Antoine PocheJonathan F. Branch, M.D., F.A.C.C.

## 2014-01-25 ENCOUNTER — Encounter: Payer: Self-pay | Admitting: Neurology

## 2014-01-25 ENCOUNTER — Ambulatory Visit (INDEPENDENT_AMBULATORY_CARE_PROVIDER_SITE_OTHER): Payer: Medicare Other | Admitting: Neurology

## 2014-01-25 VITALS — BP 108/58 | HR 62 | Wt 161.0 lb

## 2014-01-25 DIAGNOSIS — G20 Parkinson's disease: Secondary | ICD-10-CM

## 2014-01-25 DIAGNOSIS — R269 Unspecified abnormalities of gait and mobility: Secondary | ICD-10-CM

## 2014-01-25 MED ORDER — CARBIDOPA-LEVODOPA ER 50-200 MG PO TBCR: EXTENDED_RELEASE_TABLET | ORAL | Status: DC

## 2014-01-25 NOTE — Progress Notes (Signed)
Reason for visit: Parkinson's disease  Jorge Lester is an 80 y.o. male  History of present illness:  Jorge Lester is a 80 year old right-handed white male with a history of Parkinson's disease. Several months ago, the patient picked up a prescription of his Sinemet CR that looked different from his prior restrictions. Within a month after starting this prescription, he has developed dyskinesias. He was cut back by half a tablet at midday, and he seemed to improve, but he continues to have some dyskinesias that may be worse some days than others. The patient is having some problems with misinterpretation of visual images, and he may misinterpret a squirrel for a cat. The patient denies any severe balance issues, but he does have some mild gait instability, he reports no falls. The patient denies any problems with speech or swallowing. He returns for an evaluation.  Past Medical History  Diagnosis Date  . CAD (coronary artery disease)     a. CTO of LAD known since 2008, s/p DES x 2 to CTO 01/01/13. b. known residual nonobstructive disease in LCx, RCA.  [name] Hyperlipidemia   . Hypertension   . Parkinson's disease   . Mitral regurgitation     Prev mild, most recently trivial by echo 11/2012.  . Sleep apnea   . GERD (gastroesophageal reflux disease)   . Gout     Past Surgical History  Procedure Laterality Date  . Tonsillectomy and adenoidectomy  1947  . Trigger finger release  1980's  . Vasectomy    . Cardiac catheterization  2008  . Coronary angioplasty with stent placement  01/01/2013    "2" (01/01/2013)  . Lumbar disc surgery  1974    Family History  Problem Relation Age of Onset  . Ovarian cancer Mother   . Coronary artery disease Father   . Alzheimer's disease Father   . Diabetes Other   . Transient ischemic attack Other   . Diabetes Brother     Social history:  reports that he quit smoking about 24 years ago. His smoking use included Cigarettes. He has a 88 pack-year  smoking history. He has never used smokeless tobacco. He reports that he does not drink alcohol or use illicit drugs.    Allergies  Allergen Reactions  . Amantadines     Hallucinations  . Sulfonamide Derivatives     Medications:  Current Outpatient Prescriptions on File Prior to Visit  Medication Sig Dispense Refill  . aspirin 81 MG tablet Take 81 mg by mouth daily.        [name] doxazosin (CARDURA) 2 MG tablet Take 2 mg by mouth at bedtime.        . famotidine (PEPCID) 20 MG tablet TAKE (1) TABLET TWICE DAILY.  60 tablet  6  . metoprolol tartrate (LOPRESSOR) 25 MG tablet TAKE (1) TABLET TWICE DAILY.  60 tablet  3  . nitroGLYCERIN (NITROSTAT) 0.4 MG SL tablet Place 1 tablet (0.4 mg total) under the tongue every 5 (five) minutes as needed.  25 tablet  3  . ropinirole (REQUIP) 5 MG tablet TAKE 1 TABLET BY MOUTH 3 TIMES DAILY.  90 tablet  2  . simvastatin (ZOCOR) 40 MG tablet Take 1 tablet (40 mg total) by mouth at bedtime.  30 tablet  5  . traZODone (DESYREL) 50 MG tablet Take 1 tablet by mouth daily.       No current facility-administered medications on file prior to visit.    ROS:  Out of a  complete 14 system review of symptoms, the patient complains only of the following symptoms, and all other reviewed systems are negative.  Joint swelling Mild gait disturbance  Blood pressure 108/58, pulse 62, weight 161 lb (73.029 kg).  Physical Exam  General: The patient is alert and cooperative at the time of the examination.  Skin: No significant peripheral edema is noted.   Neurologic Exam  Mental status: The patient is oriented x 3.  Cranial nerves: Facial symmetry is present. Speech is normal, no aphasia or dysarthria is noted. Extraocular movements are full. Visual fields are full.  Motor: The patient has good strength in all 4 extremities.  Sensory examination: Soft touch sensation is symmetric on the face, arms, and legs.  Coordination: The patient has good  finger-nose-finger and heel-to-shin bilaterally. The dyskinesias are noted on the head and neck intermittently.  Gait and station: The patient has a normal gait. The patient has good arm swing and stride with walking. He is able to arise from a seated position with arms crossed. Tandem gait is slightly unsteady.. Romberg is negative. No drift is seen.  Reflexes: Deep tendon reflexes are symmetric.   Assessment/Plan:  One. Parkinson's disease  The patient is overall doing fairly well. The patient does have some mild dyskinesias. He will be cut back again on the Sinemet taking the 50/200 mg CR tablets, one half tablet in the morning and at noon, one full tablet in the evening. He will followup in about 4 or 5 months.  Jorge Lester. Keith Korinna Tat MD 01/25/2014 7:29 PM  [facility] [address] [place], [place] [id number]  Phone [phone] Fax [phone]

## 2014-01-25 NOTE — Patient Instructions (Signed)
Parkinson Disease Parkinson disease is a disorder of the central nervous system, which includes the brain and spinal cord. A person with this disease slowly loses the ability to completely control body movements. Within the brain, there is a group of nerve cells (basal ganglia) that help control movement. The basal ganglia are damaged and do not work properly in a person with Parkinson disease. In addition, the basal ganglia produce and use a brain chemical called dopamine. The dopamine chemical sends messages to other parts of the body to control and coordinate body movements. Dopamine levels are low in a person with Parkinson disease. If the dopamine levels are low, then the body does not receive the correct messages it needs to move normally.  CAUSES  The exact reason why the basal ganglia get damaged is not known. Some medical researchers have thought that infection, genes, environment, and certain medicines may contribute to the cause.  SYMPTOMS   An early symptom of Parkinson disease is often an uncontrolled shaking (tremor) of the hands. The tremor will often disappear when the affected hand is consciously used.  As the disease progresses, walking, talking, getting out of a chair, and new movements become more difficult.  Muscles get stiff and movements become slower.  Balance and coordination become harder.  Depression, trouble swallowing, urinary problems, constipation, and sleep problems can occur.  Later in the disease, memory and thought processes may deteriorate. DIAGNOSIS  There are no specific tests to diagnose Parkinson disease. You may be referred to a neurologist for evaluation. Your caregiver will ask about your medical history, symptoms, and perform a physical exam. Blood tests and imaging tests of your brain may be performed to rule out other diseases. The imaging tests may include an MRI or a CT scan. TREATMENT  The goal of treatment is to relieve symptoms. Medicines may be  prescribed once the symptoms become troublesome. Medicine will not stop the progression of the disease, but medicine can make movement and balance better and help control tremors. Speech and occupational therapy may also be prescribed. Sometimes, surgical treatment of the brain can be done in young people. HOME CARE INSTRUCTIONS  Get regular exercise and rest periods during the day to help prevent exhaustion and depression.  If getting dressed becomes difficult, replace buttons and zippers with Velcro and elastic on your clothing.  Take all medicine as directed by your caregiver.  Install grab bars or railings in your home to prevent falls.  Go to speech or occupational therapy as directed.  Keep all follow-up visits as directed by your caregiver. SEEK MEDICAL CARE IF:  Your symptoms are not controlled with your medicine.  You fall.  You have trouble swallowing or choke on your food. MAKE SURE YOU:  Understand these instructions.  Will watch your condition.  Will get help right away if you are not doing well or get worse. Document Released: 06/29/2000 Document Revised: 10/27/2012 Document Reviewed: 08/01/2011 [employer] Patient Information 2015 [employer]. This information is not intended to replace advice given to you by your health care provider. Make sure you discuss any questions you have with your health care provider.  

## 2014-02-04 ENCOUNTER — Other Ambulatory Visit: Payer: Self-pay | Admitting: Cardiology

## 2014-02-09 ENCOUNTER — Other Ambulatory Visit: Payer: Self-pay | Admitting: Neurology

## 2014-03-05 ENCOUNTER — Other Ambulatory Visit: Payer: Self-pay | Admitting: Cardiology

## 2014-04-29 ENCOUNTER — Other Ambulatory Visit: Payer: Self-pay | Admitting: Neurology

## 2014-04-29 NOTE — Telephone Encounter (Signed)
Last OV note says:  He will be cut back again on the Sinemet taking the 50/200 mg CR tablets, one half tablet in the morning and at noon, one full tablet in the evening

## 2014-05-27 ENCOUNTER — Other Ambulatory Visit: Payer: Self-pay | Admitting: Cardiology

## 2014-06-02 ENCOUNTER — Encounter: Payer: Self-pay | Admitting: Neurology

## 2014-06-03 ENCOUNTER — Encounter: Payer: Self-pay | Admitting: Neurology

## 2014-06-03 ENCOUNTER — Ambulatory Visit (INDEPENDENT_AMBULATORY_CARE_PROVIDER_SITE_OTHER): Payer: Medicare Other | Admitting: Neurology

## 2014-06-03 VITALS — BP 109/60 | HR 56 | Ht 72.0 in | Wt 159.4 lb

## 2014-06-03 DIAGNOSIS — G20 Parkinson's disease: Secondary | ICD-10-CM

## 2014-06-03 NOTE — Progress Notes (Signed)
Reason for visit: Parkinson's disease  Jorge Lester is an 80 y.o. male  History of present illness:  Jorge Lester is a 80 year old right-handed white male with a history of Parkinson's disease. The patient switched the generic manufactures of his Sinemet in the summer 2015, and he started having problems with hallucinations and dyskinesias shortly thereafter. The patient was cutback on the dose of the Sinemet 50/200 taking one half tablet in the morning and at midday, one full tablet in the evening. The patient continues to have some dyskinesias, but the hallucinations have reduced in frequency. The patient did have some hallucinations one month ago, but none since. The patient is remaining quite active, and he has not had any falls. His mobility remains good. The patient comes to this office for further evaluation.  Past Medical History  Diagnosis Date  . CAD (coronary artery disease)     a. CTO of LAD known since 2008, s/p DES x 2 to CTO 01/01/13. b. known residual nonobstructive disease in LCx, RCA.  [name]. Hyperlipidemia   . Hypertension   . Parkinson's disease   . Mitral regurgitation     Prev mild, most recently trivial by echo 11/2012.  . Sleep apnea   . GERD (gastroesophageal reflux disease)   . Gout     Past Surgical History  Procedure Laterality Date  . Tonsillectomy and adenoidectomy  1947  . Trigger finger release  1980's  . Vasectomy    . Cardiac catheterization  2008  . Coronary angioplasty with stent placement  01/01/2013    "2" (01/01/2013)  . Lumbar disc surgery  1974    Family History  Problem Relation Age of Onset  . Ovarian cancer Mother   . Coronary artery disease Father   . Alzheimer's disease Father   . Diabetes Other   . Transient ischemic attack Other   . Diabetes Brother     Social history:  reports that he quit smoking about 24 years ago. His smoking use included Cigarettes. He has a 88 pack-year smoking history. He has never used smokeless  tobacco. He reports that he does not drink alcohol or use illicit drugs.    Allergies  Allergen Reactions  . Amantadines     Hallucinations  . Sulfonamide Derivatives     Medications:  Current Outpatient Prescriptions on File Prior to Visit  Medication Sig Dispense Refill  . aspirin 81 MG tablet Take 81 mg by mouth daily.      . carbidopa-levodopa (SINEMET CR) 50-200 MG per tablet One tablet in the evening, one half tablet in the morning and at lunch    . doxazosin (CARDURA) 2 MG tablet Take 2 mg by mouth at bedtime.      . metoprolol tartrate (LOPRESSOR) 25 MG tablet TAKE (1) TABLET TWICE DAILY. 60 tablet 6  . nitroGLYCERIN (NITROSTAT) 0.4 MG SL tablet Place 1 tablet (0.4 mg total) under the tongue every 5 (five) minutes as needed. 25 tablet 3  . ropinirole (REQUIP) 5 MG tablet TAKE 1 TABLET BY MOUTH 3 TIMES DAILY. 90 tablet 6  . simvastatin (ZOCOR) 40 MG tablet TAKE ONE TABLET BY MOUTH AT BEDTIME. 30 tablet 6  . traZODone (DESYREL) 50 MG tablet Take 1 tablet by mouth daily.    [name]. triamcinolone ointment (KENALOG) 0.1 % Apply 0.1 g topically daily as needed.    . famotidine (PEPCID) 20 MG tablet TAKE (1) TABLET TWICE DAILY. 60 tablet 11   No current facility-administered medications on file  prior to visit.    ROS:  Out of a complete 14 system review of symptoms, the patient complains only of the following symptoms, and all other reviewed systems are negative.  Eye discharge Neck pain  Blood pressure 109/60, pulse 56, height 6' (1.829 m), weight 159 lb 6.4 oz (72.303 kg).  Physical Exam  General: The patient is alert and cooperative at the time of the examination.  Skin: No significant peripheral edema is noted.   Neurologic Exam  Mental status: The patient is oriented x 3.  Cranial nerves: Facial symmetry is present. Speech is normal, no aphasia or dysarthria is noted. Extraocular movements are full. Visual fields are full.  Motor: The patient has good strength in all  4 extremities.  Sensory examination: Soft touch sensation on the face, arms, and legs is symmetric.  Coordination: The patient has good finger-nose-finger and heel-to-shin bilaterally. Dyskinesias of the head and neck are noted.  Gait and station: The patient has a normal gait. The patient is able to arise from a seated position with arms crossed. Tandem gait is minimally unsteady. Romberg is negative. No drift is seen.  Reflexes: Deep tendon reflexes are symmetric.   Assessment/Plan:  1. Parkinson's disease  The patient is having good mobility with his current dose of the Sinemet. The patient is having some mild dyskinesias, and he will have occasional hallucinations. The patient will continue on his current dose of Sinemet, and he will follow-up in about 4 or 5 months.  Jorge Palau. Keith Willis MD 06/03/2014 12:18 PM  [facility] [address] [place], [place] [address]  Phone [phone] Fax [phone]

## 2014-06-03 NOTE — Patient Instructions (Signed)
Parkinson Disease Parkinson disease is a disorder of the central nervous system, which includes the brain and spinal cord. A person with this disease slowly loses the ability to completely control body movements. Within the brain, there is a group of nerve cells (basal ganglia) that help control movement. The basal ganglia are damaged and do not work properly in a person with Parkinson disease. In addition, the basal ganglia produce and use a brain chemical called dopamine. The dopamine chemical sends messages to other parts of the body to control and coordinate body movements. Dopamine levels are low in a person with Parkinson disease. If the dopamine levels are low, then the body does not receive the correct messages it needs to move normally.  CAUSES  The exact reason why the basal ganglia get damaged is not known. Some medical researchers have thought that infection, genes, environment, and certain medicines may contribute to the cause.  SYMPTOMS   An early symptom of Parkinson disease is often an uncontrolled shaking (tremor) of the hands. The tremor will often disappear when the affected hand is consciously used.  As the disease progresses, walking, talking, getting out of a chair, and new movements become more difficult.  Muscles get stiff and movements become slower.  Balance and coordination become harder.  Depression, trouble swallowing, urinary problems, constipation, and sleep problems can occur.  Later in the disease, memory and thought processes may deteriorate. DIAGNOSIS  There are no specific tests to diagnose Parkinson disease. You may be referred to a neurologist for evaluation. Your caregiver will ask about your medical history, symptoms, and perform a physical exam. Blood tests and imaging tests of your brain may be performed to rule out other diseases. The imaging tests may include an MRI or a CT scan. TREATMENT  The goal of treatment is to relieve symptoms. Medicines may be  prescribed once the symptoms become troublesome. Medicine will not stop the progression of the disease, but medicine can make movement and balance better and help control tremors. Speech and occupational therapy may also be prescribed. Sometimes, surgical treatment of the brain can be done in young people. HOME CARE INSTRUCTIONS  Get regular exercise and rest periods during the day to help prevent exhaustion and depression.  If getting dressed becomes difficult, replace buttons and zippers with Velcro and elastic on your clothing.  Take all medicine as directed by your caregiver.  Install grab bars or railings in your home to prevent falls.  Go to speech or occupational therapy as directed.  Keep all follow-up visits as directed by your caregiver. SEEK MEDICAL CARE IF:  Your symptoms are not controlled with your medicine.  You fall.  You have trouble swallowing or choke on your food. MAKE SURE YOU:  Understand these instructions.  Will watch your condition.  Will get help right away if you are not doing well or get worse. Document Released: 06/29/2000 Document Revised: 10/27/2012 Document Reviewed: 08/01/2011 [employer] Patient Information 2015 [employer]. This information is not intended to replace advice given to you by your health care provider. Make sure you discuss any questions you have with your health care provider.  

## 2014-06-08 ENCOUNTER — Encounter: Payer: Self-pay | Admitting: Neurology

## 2014-06-24 ENCOUNTER — Encounter (HOSPITAL_COMMUNITY): Payer: Self-pay | Admitting: Cardiology

## 2014-07-26 ENCOUNTER — Other Ambulatory Visit: Payer: Self-pay | Admitting: Neurology

## 2014-08-02 ENCOUNTER — Other Ambulatory Visit: Payer: Self-pay | Admitting: Cardiology

## 2014-08-04 ENCOUNTER — Other Ambulatory Visit: Payer: Self-pay | Admitting: Neurology

## 2014-08-23 ENCOUNTER — Other Ambulatory Visit: Payer: Self-pay | Admitting: Cardiology

## 2014-10-04 ENCOUNTER — Ambulatory Visit (INDEPENDENT_AMBULATORY_CARE_PROVIDER_SITE_OTHER): Payer: Medicare Other | Admitting: Neurology

## 2014-10-04 ENCOUNTER — Encounter: Payer: Self-pay | Admitting: Neurology

## 2014-10-04 VITALS — BP 121/67 | HR 64 | Ht 72.0 in | Wt 161.2 lb

## 2014-10-04 DIAGNOSIS — R269 Unspecified abnormalities of gait and mobility: Secondary | ICD-10-CM

## 2014-10-04 DIAGNOSIS — G20 Parkinson's disease: Secondary | ICD-10-CM | POA: Diagnosis not present

## 2014-10-04 DIAGNOSIS — R441 Visual hallucinations: Secondary | ICD-10-CM

## 2014-10-04 NOTE — Progress Notes (Signed)
Reason for visit: Parkinson's disease  Jorge Lester is an 81 y.o. male  History of present illness:  Jorge Lester is a 81 year old right-handed white male with a history of Parkinson's disease. The patient indicates that during the first hour of the day, he has difficulty walking, shuffling his feet. He does well during the latter part of the day. As it turns out, the patient has inverted his Sinemet schedule taking a full tablet in the morning instead of a full tablet at night of the 50/200 mg CR tablets. He is to take one half tablet in the morning and one half tablet at midday. The patient still has some occasional hallucinations. He indicates that this is not a severe issue for him. He denies any falls, and he denies problems choking. He denies any problems with unusual or vivid dreams at night. He will usually sleeps only 4 or 5 hours at night, but he indicates that this is normal for him, and he has good energy levels during the day. He denies any other new medical issues that have come up since last seen.  Past Medical History  Diagnosis Date  . CAD (coronary artery disease)     a. CTO of LAD known since 2008, s/p DES x 2 to CTO 01/01/13. b. known residual nonobstructive disease in LCx, RCA.  [name]. Hyperlipidemia   . Hypertension   . Parkinson's disease   . Mitral regurgitation     Prev mild, most recently trivial by echo 11/2012.  . Sleep apnea   . GERD (gastroesophageal reflux disease)   . Gout     Past Surgical History  Procedure Laterality Date  . Tonsillectomy and adenoidectomy  1947  . Trigger finger release  1980's  . Vasectomy    . Cardiac catheterization  2008  . Coronary angioplasty with stent placement  01/01/2013    "2" (01/01/2013)  . Lumbar disc surgery  1974  . Percutaneous coronary stent intervention (pci-s) N/A 01/01/2013    Procedure: PERCUTANEOUS CORONARY STENT INTERVENTION (PCI-S);  Surgeon: Peter M SwazilandJordan, MD;  [facility];  Service:  Cardiovascular;  Laterality: N/A;    Family History  Problem Relation Age of Onset  . Ovarian cancer Mother   . Coronary artery disease Father   . Alzheimer's disease Father   . Diabetes Other   . Transient ischemic attack Other   . Diabetes Brother     Social history:  reports that he quit smoking about 25 years ago. His smoking use included Cigarettes. He has a 88 pack-year smoking history. He has never used smokeless tobacco. He reports that he does not drink alcohol or use illicit drugs.    Allergies  Allergen Reactions  . Amantadines     Hallucinations  . Sulfonamide Derivatives     Medications:  Prior to Admission medications   Medication Sig Start Date End Date Taking? Authorizing Provider  aspirin 81 MG tablet Take 81 mg by mouth daily.     Yes Historical Provider, MD  carbidopa-levodopa (SINEMET CR) 50-200 MG per tablet TAKE 1/2 TABLET BY MOUTH IN THE MORNING AND AT NOON, THEN 1 TABLET INTHE EVENING. Patient taking differently: TAKE 1 TABLET BY MOUTH IN THE MORNING, THEN 1/2 TABLET AT NOON AND IN THE EVENING. 07/26/14  Yes York Spanielharles K Maxwell Martorano, MD  doxazosin (CARDURA) 2 MG tablet Take 2 mg by mouth at bedtime.     Yes Historical Provider, MD  famotidine (PEPCID) 20 MG tablet TAKE (1) TABLET  TWICE DAILY. 03/05/14  Yes Antoine Poche, MD  FLUVIRIN SUSP  04/30/14  Yes Historical Provider, MD  metoprolol tartrate (LOPRESSOR) 25 MG tablet TAKE (1) TABLET TWICE DAILY. 05/27/14  Yes Antoine Poche, MD  nitroGLYCERIN (NITROSTAT) 0.4 MG SL tablet Place 1 tablet (0.4 mg total) under the tongue every 5 (five) minutes as needed. 11/13/12  Yes Eugene C Serpe, PA-C  ropinirole (REQUIP) 5 MG tablet TAKE 1 TABLET BY MOUTH 3 TIMES DAILY. 08/04/14  Yes York Spaniel, MD  simvastatin (ZOCOR) 40 MG tablet TAKE ONE TABLET BY MOUTH AT BEDTIME. 08/02/14  Yes Antoine Poche, MD  simvastatin (ZOCOR) 40 MG tablet TAKE ONE TABLET BY MOUTH AT BEDTIME. 08/23/14  Yes Antoine Poche, MD  traZODone  (DESYREL) 50 MG tablet Take 1 tablet by mouth daily. 10/20/12  Yes Historical Provider, MD  triamcinolone ointment (KENALOG) 0.1 % Apply 0.1 g topically daily as needed. 11/24/13  Yes Historical Provider, MD    ROS:  Out of a complete 14 system review of symptoms, the patient complains only of the following symptoms, and all other reviewed systems are negative.  Mild gait disorder Hallucinations  Blood pressure 121/67, pulse 64, height 6' (1.829 m), weight 161 lb 3.2 oz (73.12 kg).  Physical Exam  General: The patient is alert and cooperative at the time of the examination. Mild dyskinesias involving the head and neck are noted.  Skin: No significant peripheral edema is noted.   Neurologic Exam  Mental status: The patient is alert and oriented x 3 at the time of the examination. The patient has apparent normal recent and remote memory, with an apparently normal attention span and concentration ability.   Cranial nerves: Facial symmetry is present. Speech is normal, no aphasia or dysarthria is noted. Extraocular movements are full. Visual fields are full. Mild masking of the face is noted.  Motor: The patient has good strength in all 4 extremities.  Sensory examination: Soft touch sensation is symmetric on the face, arms, and legs.  Coordination: The patient has good finger-nose-finger and heel-to-shin bilaterally.  Gait and station: The patient has a normal gait. The patient is able to rise from a seated position with arms crossed. Tandem gait is slightly unsteady. Romberg is negative. No drift is seen.  Reflexes: Deep tendon reflexes are symmetric.   Assessment/Plan:  1. Parkinson's disease  2. Hallucinations  3. Mild gait disorder  The patient overall function fairly well but he is having problems in the morning trying to get started. He will need to revert back to taking one full tablet of the Sinemet in the evening, and one half tablet in the morning and at midday. He  will continue on his current Requip dose taking 5 mg 3 times daily. He will call me in 3 or 4 weeks if his early morning functioning has not improved. He will otherwise follow-up in about 5 months.  Marlan Palau MD 10/04/2014 7:12 PM  [facility] [address] [place], [place] [address]  Phone [phone] Fax [phone]

## 2014-10-04 NOTE — Patient Instructions (Signed)

## 2014-12-20 ENCOUNTER — Other Ambulatory Visit: Payer: Self-pay | Admitting: Cardiology

## 2015-01-07 ENCOUNTER — Encounter: Payer: Self-pay | Admitting: Cardiology

## 2015-01-07 ENCOUNTER — Encounter: Payer: Medicare Other | Admitting: Cardiology

## 2015-01-07 NOTE — Progress Notes (Signed)
ERROR No Show  

## 2015-01-19 ENCOUNTER — Encounter: Payer: Self-pay | Admitting: Cardiology

## 2015-01-19 ENCOUNTER — Ambulatory Visit (INDEPENDENT_AMBULATORY_CARE_PROVIDER_SITE_OTHER): Payer: Medicare Other | Admitting: Cardiology

## 2015-01-19 VITALS — BP 136/68 | HR 63 | Ht 72.0 in | Wt 154.4 lb

## 2015-01-19 DIAGNOSIS — E785 Hyperlipidemia, unspecified: Secondary | ICD-10-CM | POA: Diagnosis not present

## 2015-01-19 DIAGNOSIS — I10 Essential (primary) hypertension: Secondary | ICD-10-CM | POA: Diagnosis not present

## 2015-01-19 DIAGNOSIS — I2510 Atherosclerotic heart disease of native coronary artery without angina pectoris: Secondary | ICD-10-CM

## 2015-01-19 MED ORDER — ATORVASTATIN CALCIUM 80 MG PO TABS: 80.0000 mg | ORAL_TABLET | Freq: Every day | ORAL | Status: DC

## 2015-01-19 NOTE — Progress Notes (Signed)
Clinical Summary Jorge Lester is a 81 y.o.male seen today for follow up of the following medical problems.   1. CAD  - PCI to LAD CTO 12/2012 with DES x2  - 11/2012 echo LVEF 55-60%,   -  No chest pain. Walks 1/4 mile regularly, partially uphill. With this level of exertion does note some DOE at times.  - compliant w/ meds  2. HTN  - does not check at home  - compliant with meds   3. HL  - compliant with statin   Past Medical History  Diagnosis Date  . CAD (coronary artery disease)     a. CTO of LAD known since 2008, s/p DES x 2 to CTO 01/01/13. b. known residual nonobstructive disease in LCx, RCA.  [name] Hyperlipidemia   . Hypertension   . Parkinson's disease   . Mitral regurgitation     Prev mild, most recently trivial by echo 11/2012.  . Sleep apnea   . GERD (gastroesophageal reflux disease)   . Gout      Allergies  Allergen Reactions  . Amantadines     Hallucinations  . Sulfonamide Derivatives      Current Outpatient Prescriptions  Medication Sig Dispense Refill  . aspirin 81 MG tablet Take 81 mg by mouth daily.      . carbidopa-levodopa (SINEMET CR) 50-200 MG per tablet TAKE 1/2 TABLET BY MOUTH IN THE MORNING AND AT NOON, THEN 1 TABLET INTHE EVENING. (Patient taking differently: TAKE 1 TABLET BY MOUTH IN THE MORNING, THEN 1/2 TABLET AT NOON AND IN THE EVENING.) 60 tablet 6  . doxazosin (CARDURA) 2 MG tablet Take 2 mg by mouth at bedtime.      . famotidine (PEPCID) 20 MG tablet TAKE (1) TABLET TWICE DAILY. 60 tablet 11  . FLUVIRIN SUSP     . metoprolol tartrate (LOPRESSOR) 25 MG tablet TAKE (1) TABLET TWICE DAILY. 60 tablet 6  . nitroGLYCERIN (NITROSTAT) 0.4 MG SL tablet Place 1 tablet (0.4 mg total) under the tongue every 5 (five) minutes as needed. 25 tablet 3  . ropinirole (REQUIP) 5 MG tablet TAKE 1 TABLET BY MOUTH 3 TIMES DAILY. 90 tablet 6  . simvastatin (ZOCOR) 40 MG tablet TAKE ONE TABLET BY MOUTH AT BEDTIME. 30 tablet 6  . simvastatin (ZOCOR) 40 MG  tablet TAKE ONE TABLET BY MOUTH AT BEDTIME. 30 tablet 11  . traZODone (DESYREL) 50 MG tablet Take 1 tablet by mouth daily.    [name] triamcinolone ointment (KENALOG) 0.1 % Apply 0.1 g topically daily as needed.     No current facility-administered medications for this visit.     Past Surgical History  Procedure Laterality Date  . Tonsillectomy and adenoidectomy  1947  . Trigger finger release  1980's  . Vasectomy    . Cardiac catheterization  2008  . Coronary angioplasty with stent placement  01/01/2013    "2" (01/01/2013)  . Lumbar disc surgery  1974  . Percutaneous coronary stent intervention (pci-s) N/A 01/01/2013    Procedure: PERCUTANEOUS CORONARY STENT INTERVENTION (PCI-S);  Surgeon: Peter M Swaziland, MD;  Location: [facility] CATH LAB;  Service: Cardiovascular;  Laterality: N/A;     Allergies  Allergen Reactions  . Amantadines     Hallucinations  . Sulfonamide Derivatives       Family History  Problem Relation Age of Onset  . Ovarian cancer Mother   . Coronary artery disease Father   . Alzheimer's disease Father   . Diabetes Other   .  Transient ischemic attack Other   . Diabetes Brother      Social History Jorge Lester reports that he quit smoking about 25 years ago. His smoking use included Cigarettes. He has a 88 pack-year smoking history. He has never used smokeless tobacco. Jorge Lester reports that he does not drink alcohol.   Review of Systems CONSTITUTIONAL: No weight loss, fever, chills, weakness or fatigue.  HEENT: Eyes: No visual loss, blurred vision, double vision or yellow sclerae.No hearing loss, sneezing, congestion, runny nose or sore throat.  SKIN: No rash or itching.  CARDIOVASCULAR: per HPI RESPIRATORY: No cough or sputum.  GASTROINTESTINAL: No anorexia, nausea, vomiting or diarrhea. No abdominal pain or blood.  GENITOURINARY: No burning on urination, no polyuria NEUROLOGICAL: No headache, dizziness, syncope, paralysis, ataxia, numbness or tingling in the  extremities. No change in bowel or bladder control.  MUSCULOSKELETAL: No muscle, back pain, joint pain or stiffness.  LYMPHATICS: No enlarged nodes. No history of splenectomy.  PSYCHIATRIC: No history of depression or anxiety.  ENDOCRINOLOGIC: No reports of sweating, cold or heat intolerance. No polyuria or polydipsia.  [name]   Physical Examination Filed Vitals:   01/19/15 1534  BP: 136/68  Pulse: 63   Filed Vitals:   01/19/15 1534  Height: 6' (1.829 m)  Weight: 154 lb 6.4 oz (70.035 kg)    Gen: resting comfortably, no acute distress HEENT: no scleral icterus, pupils equal round and reactive, no palptable cervical adenopathy,  CV: RRR, no m/r/g, no JVD Resp: Clear to auscultation bilaterally GI: abdomen is soft, non-tender, non-distended, normal bowel sounds, no hepatosplenomegaly MSK: extremities are warm, no edema.  Skin: warm, no rash Neuro:  no focal deficits Psych: appropriate affect   Diagnostic Studies   11/2012 Cath  Procedural Findings:  Hemodynamics:  AO 117/52 with a mean of 79 mmHg  LV 116/14 mmHg  Coronary angiography:  Coronary dominance: right  Left mainstem: The left main coronary is normal.  Left anterior descending (LAD): The left anterior descending artery is diffusely diseased in the proximal vessel up to 75%. It is occluded in the mid vessel. There are left to left collaterals from the ramus intermediate branch. There are also right-to-left collaterals via septal perforators to the distal LAD. The proximal Is well-defined and there appears to be a small tract through the occlusion.  There is a ramus intermediate branch which is moderate to large. It has mild irregularities less than 20%.  Left circumflex (LCx): The left circumflex gives rise to a single bifurcating marginal branch. The proximal OM has a segmental 60% stenosis.  Right coronary artery (RCA): The right coronary is a large dominant vessel. It is diffusely diseased throughout with  stenosis up to 40% in the mid vessel. There is significant plaque burden throughout without significant stenosis.  Left ventriculography: Left ventricular systolic function is normal, LVEF is estimated at 55-65%, there is no significant mitral regurgitation  Final Conclusions:  1. Single vessel occlusive coronary disease with chronic total occlusion of the mid LAD.  2. Normal left ventricular function.  Recommendations: The patient has significant symptoms and ischemia on noninvasive testing despite 2 antianginal agents. His LAD CTO appears suitable for PCI. The alternative would be to increase his antianginal therapy. I will discuss options with the patient.   01/01/13  Lesion Data:  Vessel: LAD  Percent stenosis (pre): 100%  TIMI-flow (pre): 0  Stent: 2.5 x 38 and 2.75 x 12 mm Promus premier stents  Percent stenosis (post): 0%  TIMI-flow (post): 3  Conclusions:  Successful stenting of the mid LAD for CTO with drug-eluting stents.   11/2012 Echo  LVEF 55-60%, grade II diastolic dysfunction,    Assessment and Plan  1. CAD  -patient s/p DES to LAD CTO in June 2014, denies any recent symptoms - continue all other medications, continue risk factor modification   2. HTN  - at goal, continue current meds   3. Hyperlipidemia  - in setting of known CAD will change to atorva 80mg  daily   F/u 6 months      Antoine PocheJonathan F. Branch, M.D

## 2015-01-19 NOTE — Patient Instructions (Signed)
   Stop Simvastatin.  Begin Atorvastatin 80mg  daily - [facility]. Continue all other medications.   Your physician wants you to follow up in: 6 months.  You will receive a reminder letter in the mail one-two months in advance.  If you don't receive a letter, please call our office to schedule the follow up appointment

## 2015-03-03 ENCOUNTER — Other Ambulatory Visit: Payer: Self-pay

## 2015-03-03 MED ORDER — ROPINIROLE HCL 5 MG PO TABS: ORAL_TABLET | ORAL | Status: DC

## 2015-03-07 ENCOUNTER — Other Ambulatory Visit: Payer: Self-pay | Admitting: Cardiology

## 2015-03-14 ENCOUNTER — Ambulatory Visit (INDEPENDENT_AMBULATORY_CARE_PROVIDER_SITE_OTHER): Payer: Medicare Other | Admitting: Neurology

## 2015-03-14 ENCOUNTER — Encounter: Payer: Self-pay | Admitting: Neurology

## 2015-03-14 VITALS — BP 142/70 | HR 58 | Ht 71.0 in | Wt 152.0 lb

## 2015-03-14 DIAGNOSIS — G20 Parkinson's disease: Secondary | ICD-10-CM | POA: Diagnosis not present

## 2015-03-14 DIAGNOSIS — R269 Unspecified abnormalities of gait and mobility: Secondary | ICD-10-CM

## 2015-03-14 MED ORDER — CARBIDOPA-LEVODOPA ER 50-200 MG PO TBCR: EXTENDED_RELEASE_TABLET | ORAL | Status: DC

## 2015-03-14 MED ORDER — CARBIDOPA-LEVODOPA 25-100 MG PO TABS: 1.0000 | ORAL_TABLET | Freq: Every morning | ORAL | Status: DC

## 2015-03-14 NOTE — Progress Notes (Signed)
Reason for visit: Parkinson's disease  Jorge Lester is an 81 y.o. male  History of present illness:  Jorge Lester is a 81 year old right-handed white male with a history of Parkinson's disease. The patient has continued to have some issues with mobility first thing in the morning. He may take an hour to fully become mobile. He denies any falls. He does have some freezing episodes inside the house, not outside. He does not use a cane or a walker for ambulation. The patient is on the Sinemet CR tablets 50/200 mg tablet, taking a full tablet in the evening, one half tablet in the morning and midday, but he indicates that he is taking the full tablet dose in the morning. The patient denies any issues with swallowing. He will have hallucinations on occasion, he usually is seeing people in the yard. He is able to discern that the episodes are hallucinations, and the hallucinations do not upset him. The patient denies any difficulty with swallowing. He returns to this office for an evaluation. The patient recently has lost his wife, he is living alone.  Past Medical History  Diagnosis Date  . CAD (coronary artery disease)     a. CTO of LAD known since 2008, s/p DES x 2 to CTO 01/01/13. b. known residual nonobstructive disease in LCx, RCA.  [name] Hyperlipidemia   . Hypertension   . Parkinson's disease   . Mitral regurgitation     Prev mild, most recently trivial by echo 11/2012.  . Sleep apnea   . GERD (gastroesophageal reflux disease)   . Gout     Past Surgical History  Procedure Laterality Date  . Tonsillectomy and adenoidectomy  1947  . Trigger finger release  1980's  . Vasectomy    . Cardiac catheterization  2008  . Coronary angioplasty with stent placement  01/01/2013    "2" (01/01/2013)  . Lumbar disc surgery  1974  . Percutaneous coronary stent intervention (pci-s) N/A 01/01/2013    Procedure: PERCUTANEOUS CORONARY STENT INTERVENTION (PCI-S);  Surgeon: Peter M Swaziland, MD;  Location: [facility]  CATH LAB;  Service: Cardiovascular;  Laterality: N/A;    Family History  Problem Relation Age of Onset  . Ovarian cancer Mother   . Coronary artery disease Father   . Alzheimer's disease Father   . Diabetes Other   . Transient ischemic attack Other   . Diabetes Brother     Social history:  reports that he quit smoking about 25 years ago. His smoking use included Cigarettes. He has a 88 pack-year smoking history. He has never used smokeless tobacco. He reports that he does not drink alcohol or use illicit drugs.    Allergies  Allergen Reactions  . Amantadines     Hallucinations  . Sulfonamide Derivatives     Medications:  Prior to Admission medications   Medication Sig Start Date End Date Taking? Authorizing Provider  aspirin 81 MG tablet Take 81 mg by mouth daily.     Yes Historical Provider, MD  atorvastatin (LIPITOR) 80 MG tablet Take 1 tablet (80 mg total) by mouth daily. 01/19/15  Yes Antoine Poche, MD  carbidopa-levodopa (SINEMET CR) 50-200 MG per tablet TAKE 1/2 TABLET BY MOUTH IN THE MORNING AND AT NOON, THEN 1 TABLET INTHE EVENING. Patient taking differently: TAKE 1 TABLET BY MOUTH IN THE MORNING, THEN 1/2 TABLET AT NOON AND IN THE EVENING. 07/26/14  Yes York Spaniel, MD  doxazosin (CARDURA) 2 MG tablet Take 2 mg by  mouth at bedtime.     Yes Historical Provider, MD  famotidine (PEPCID) 20 MG tablet TAKE (1) TABLET TWICE DAILY. 03/07/15  Yes Antoine Poche, MD  metoprolol tartrate (LOPRESSOR) 25 MG tablet TAKE (1) TABLET TWICE DAILY. 12/20/14  Yes Antoine Poche, MD  nitroGLYCERIN (NITROSTAT) 0.4 MG SL tablet Place 1 tablet (0.4 mg total) under the tongue every 5 (five) minutes as needed. 11/13/12  Yes Eugene C Serpe, PA-C  ropinirole (REQUIP) 5 MG tablet TAKE 1 TABLET BY MOUTH 3 TIMES DAILY. 03/03/15  Yes York Spaniel, MD  traZODone (DESYREL) 50 MG tablet Take 1 tablet by mouth daily. 10/20/12  Yes Historical Provider, MD  triamcinolone ointment (KENALOG) 0.1 %  Apply 0.1 g topically daily as needed. 11/24/13  Yes Historical Provider, MD  carbidopa-levodopa (SINEMET) 25-100 MG per tablet Take 1 tablet by mouth every morning. 03/14/15   York Spaniel, MD    ROS:  Out of a complete 14 system review of symptoms, the patient complains only of the following symptoms, and all other reviewed systems are negative.  Gait disturbance Hallucinations  Blood pressure 142/70, pulse 58, height  (1.803 m), weight 152 lb (68.947 kg).  Physical Exam  General: The patient is alert and cooperative at the time of the examination.  Skin: No significant peripheral edema is noted.   Neurologic Exam  Mental status: The patient is alert and oriented x 3 at the time of the examination. The patient has apparent normal recent and remote memory, with an apparently normal attention span and concentration ability.   Cranial nerves: Facial symmetry is present. Speech is normal, no aphasia or dysarthria is noted. Extraocular movements are full. Visual fields are full. Masking of the face is seen.  Motor: The patient has good strength in all 4 extremities.  Sensory examination: Soft touch sensation is symmetric on the face, arms, and legs.  Coordination: The patient has good finger-nose-finger and heel-to-shin bilaterally.  Gait and station: The patient is able to rise from a seated position with the arms crossed. Once up, the patient has a stooped posture, some decrease in arm swing bilaterally.  No drift is seen.  Reflexes: Deep tendon reflexes are symmetric.   Assessment/Plan:  1. Parkinson's disease  2. Gait disturbance  The patient is having most difficulty in the morning. We will add a short acting Sinemet tablet 25/100 mg tablet in the morning. He will continue his Sinemet CR tablet without change in dosing. He will follow-up in about 4 or 5 months. He will contact me if he is having any significant problems in the future.  Marlan Palau  MD 03/14/2015 7:21 PM  [facility] [address] [place], [place] [address]  Phone [phone] Fax [phone]

## 2015-03-14 NOTE — Patient Instructions (Addendum)
We will continue the Sinemet CR 50/200 mg tablet, one half tablet in the morning and midday, one full tablet in the evening. We will add short-acting Sinemet 25/100 mg tablet in the morning. Call if you have any concerns.  Fall Prevention and Home Safety Falls cause injuries and can affect all age groups. It is possible to use preventive measures to significantly decrease the likelihood of falls. There are many simple measures which can make your home safer and prevent falls. OUTDOORS  Repair cracks and edges of walkways and driveways.  Remove high doorway thresholds.  Trim shrubbery on the main path into your home.  Have good outside lighting.  Clear walkways of tools, rocks, debris, and clutter.  Check that handrails are not broken and are securely fastened. Both sides of steps should have handrails.  Have leaves, snow, and ice cleared regularly.  Use sand or salt on walkways during winter months.  In the garage, clean up grease or oil spills. BATHROOM  Install night lights.  Install grab bars by the toilet and in the tub and shower.  Use non-skid mats or decals in the tub or shower.  Place a plastic non-slip stool in the shower to sit on, if needed.  Keep floors dry and clean up all water on the floor immediately.  Remove soap buildup in the tub or shower on a regular basis.  Secure bath mats with non-slip, double-sided rug tape.  Remove throw rugs and tripping hazards from the floors. BEDROOMS  Install night lights.  Make sure a bedside light is easy to reach.  Do not use oversized bedding.  Keep a telephone by your bedside.  Have a firm chair with side arms to use for getting dressed.  Remove throw rugs and tripping hazards from the floor. KITCHEN  Keep handles on pots and pans turned toward the center of the stove. Use back burners when possible.  Clean up spills quickly and allow time for drying.  Avoid walking on wet floors.  Avoid hot utensils  and knives.  Position shelves so they are not too high or low.  Place commonly used objects within easy reach.  If necessary, use a sturdy step stool with a grab bar when reaching.  Keep electrical cables out of the way.  Do not use floor polish or wax that makes floors slippery. If you must use wax, use non-skid floor wax.  Remove throw rugs and tripping hazards from the floor. STAIRWAYS  Never leave objects on stairs.  Place handrails on both sides of stairways and use them. Fix any loose handrails. Make sure handrails on both sides of the stairways are as long as the stairs.  Check carpeting to make sure it is firmly attached along stairs. Make repairs to worn or loose carpet promptly.  Avoid placing throw rugs at the top or bottom of stairways, or properly secure the rug with carpet tape to prevent slippage. Get rid of throw rugs, if possible.  Have an electrician put in a light switch at the top and bottom of the stairs. OTHER FALL PREVENTION TIPS  Wear low-heel or rubber-soled shoes that are supportive and fit well. Wear closed toe shoes.  When using a stepladder, make sure it is fully opened and both spreaders are firmly locked. Do not climb a closed stepladder.  Add color or contrast paint or tape to grab bars and handrails in your home. Place contrasting color strips on first and last steps.  Learn and use mobility  aids as needed. Install an electrical emergency response system.  Turn on lights to avoid dark areas. Replace light bulbs that burn out immediately. Get light switches that glow.  Arrange furniture to create clear pathways. Keep furniture in the same place.  Firmly attach carpet with non-skid or double-sided tape.  Eliminate uneven floor surfaces.  Select a carpet pattern that does not visually hide the edge of steps.  Be aware of all pets. OTHER HOME SAFETY TIPS  Set the water temperature for 120 F (48.8 C).  Keep emergency numbers on or near  the telephone.  Keep smoke detectors on every level of the home and near sleeping areas. Document Released: 06/22/2002 Document Revised: 01/01/2012 Document Reviewed: 09/21/2011 [facility] Patient Information 2015 [place], [place]. This information is not intended to replace advice given to you by your health care provider. Make sure you discuss any questions you have with your health care provider.

## 2015-03-16 ENCOUNTER — Other Ambulatory Visit: Payer: Self-pay | Admitting: Neurology

## 2015-04-01 ENCOUNTER — Other Ambulatory Visit: Payer: Self-pay | Admitting: Neurology

## 2015-04-28 ENCOUNTER — Telehealth: Payer: Self-pay | Admitting: Neurology

## 2015-04-28 NOTE — Telephone Encounter (Signed)
I called the patient. He tripped over a drop cord yesterday and fell. He states he caught himself with his hands, but he did hit his head/nose when he fell. He stated he did not need to go to the ED because he was able to get up on his own. He states he took a Tramadol yesterday for a headache, but he feels fine today and has not had to take anymore.

## 2015-04-28 NOTE — Telephone Encounter (Signed)
I called patient. The patient is to contact me if there has been a fundamental change in balance or ability to function, otherwise, he is to be more careful about walking.

## 2015-04-28 NOTE — Telephone Encounter (Signed)
Patient called to let Dr. Anne HahnWillis know that he fell for the 1st time yesterday. He was shuffling his feet (shuffling still about the same), tripped over the electrical cord. Wasn't hurt bad, just wanted Dr. Anne HahnWillis to know.

## 2015-05-04 ENCOUNTER — Other Ambulatory Visit: Payer: Self-pay | Admitting: Cardiology

## 2015-05-05 ENCOUNTER — Other Ambulatory Visit: Payer: Self-pay | Admitting: Cardiology

## 2015-06-17 ENCOUNTER — Other Ambulatory Visit: Payer: Self-pay | Admitting: Cardiology

## 2015-08-15 ENCOUNTER — Encounter (INDEPENDENT_AMBULATORY_CARE_PROVIDER_SITE_OTHER): Payer: Self-pay

## 2015-08-15 ENCOUNTER — Encounter: Payer: Self-pay | Admitting: Neurology

## 2015-08-15 ENCOUNTER — Ambulatory Visit (INDEPENDENT_AMBULATORY_CARE_PROVIDER_SITE_OTHER): Payer: Medicare Other | Admitting: Neurology

## 2015-08-15 VITALS — BP 137/70 | HR 63 | Ht 72.0 in | Wt 159.5 lb

## 2015-08-15 DIAGNOSIS — R269 Unspecified abnormalities of gait and mobility: Secondary | ICD-10-CM

## 2015-08-15 DIAGNOSIS — G20 Parkinson's disease: Secondary | ICD-10-CM | POA: Diagnosis not present

## 2015-08-15 MED ORDER — CARBIDOPA-LEVODOPA 25-100 MG PO TABS: 0.5000 | ORAL_TABLET | ORAL | Status: DC

## 2015-08-15 NOTE — Patient Instructions (Signed)
Parkinson Disease Parkinson disease is a disorder of the central nervous system, which includes the brain and spinal cord. A person with this disease slowly loses the ability to completely control body movements. Within the brain, there is a group of nerve cells (basal ganglia) that help control movement. The basal ganglia are damaged and do not work properly in a person with Parkinson disease. In addition, the basal ganglia produce and use a brain chemical called dopamine. The dopamine chemical sends messages to other parts of the body to control and coordinate body movements. Dopamine levels are low in a person with Parkinson disease. If the dopamine levels are low, then the body does not receive the correct messages it needs to move normally.  CAUSES  The exact reason why the basal ganglia get damaged is not known. Some medical researchers have thought that infection, genes, environment, and certain medicines may contribute to the cause.  SYMPTOMS   An early symptom of Parkinson disease is often an uncontrolled shaking (tremor) of the hands. The tremor will often disappear when the affected hand is consciously used.  As the disease progresses, walking, talking, getting out of a chair, and new movements become more difficult.  Muscles get stiff and movements become slower.  Balance and coordination become harder.  Depression, trouble swallowing, urinary problems, constipation, and sleep problems can occur.  Later in the disease, memory and thought processes may deteriorate. DIAGNOSIS  There are no specific tests to diagnose Parkinson disease. You may be referred to a neurologist for evaluation. Your caregiver will ask about your medical history, symptoms, and perform a physical exam. Blood tests and imaging tests of your brain may be performed to rule out other diseases. The imaging tests may include an MRI or a CT scan. TREATMENT  The goal of treatment is to relieve symptoms. Medicines may be  prescribed once the symptoms become troublesome. Medicine will not stop the progression of the disease, but medicine can make movement and balance better and help control tremors. Speech and occupational therapy may also be prescribed. Sometimes, surgical treatment of the brain can be done in young people. HOME CARE INSTRUCTIONS  Get regular exercise and rest periods during the day to help prevent exhaustion and depression.  If getting dressed becomes difficult, replace buttons and zippers with Velcro and elastic on your clothing.  Take all medicine as directed by your caregiver.  Install grab bars or railings in your home to prevent falls.  Go to speech or occupational therapy as directed.  Keep all follow-up visits as directed by your caregiver. SEEK MEDICAL CARE IF:  Your symptoms are not controlled with your medicine.  You fall.  You have trouble swallowing or choke on your food. MAKE SURE YOU:  Understand these instructions.  Will watch your condition.  Will get help right away if you are not doing well or get worse.   This information is not intended to replace advice given to you by your health care provider. Make sure you discuss any questions you have with your health care provider.   Document Released: 06/29/2000 Document Revised: 10/27/2012 Document Reviewed: 08/01/2011 Elsevier Interactive Patient Education 2016 [employer]  

## 2015-08-15 NOTE — Progress Notes (Signed)
Reason for visit: Parkinson's disease  Jorge Lester is an 82 y.o. male  History of present illness:  Jorge Lester is a 82 year old right-handed white male with a history of Parkinson's disease. The patient is on a relatively low dose of Sinemet taking the 50/200 mg CR tablets, one half in the morning, one half at noon, and one full tablet in the evening. He has had difficulty with tolerating higher doses of medication secondary to hallucinations. The hallucinations are primarily visual, seeing people out in the yard, never inside the house. At times, the patient has difficulty distinguishing whether the hallucinations are real or not. The patient generally will have issues in the evening hours. He was placed on a full dose Sinemet 25/100 mg tablet in the morning to help him function better. The patient has difficulty with freezing for 2 or 3 hours in the morning after he takes his medication. The patient had increased problems with hallucinations on the short acting drug, he stopped the medication and has done much better with the hallucinations. Occasionally, he may have an auditory component to the hallucinations. The patient has fallen on 2 occasions since last seen, once inside the house and once outside. He has not sustained significant injury. The patient has been operating a motor vehicle without difficulty. He denies any pervasive memory issues. He is living alone.  Past Medical History  Diagnosis Date  . CAD (coronary artery disease)     a. CTO of LAD known since 2008, s/p DES x 2 to CTO 01/01/13. b. known residual nonobstructive disease in LCx, RCA.  [name] Hyperlipidemia   . Hypertension   . Parkinson's disease   . Mitral regurgitation     Prev mild, most recently trivial by echo 11/2012.  . Sleep apnea   . GERD (gastroesophageal reflux disease)   . Gout     Past Surgical History  Procedure Laterality Date  . Tonsillectomy and adenoidectomy  1947  . Trigger finger release  1980's   . Vasectomy    . Cardiac catheterization  2008  . Coronary angioplasty with stent placement  01/01/2013    "2" (01/01/2013)  . Lumbar disc surgery  1974  . Percutaneous coronary stent intervention (pci-s) N/A 01/01/2013    Procedure: PERCUTANEOUS CORONARY STENT INTERVENTION (PCI-S);  Surgeon: Peter M Swaziland, MD;  Location: [facility] CATH LAB;  Service: Cardiovascular;  Laterality: N/A;    Family History  Problem Relation Age of Onset  . Ovarian cancer Mother   . Coronary artery disease Father   . Alzheimer's disease Father   . Diabetes Other   . Transient ischemic attack Other   . Diabetes Brother     Social history:  reports that he quit smoking about 26 years ago. His smoking use included Cigarettes. He has a 88 pack-year smoking history. He has never used smokeless tobacco. He reports that he does not drink alcohol or use illicit drugs.    Allergies  Allergen Reactions  . Amantadines     Hallucinations  . Sulfonamide Derivatives     Medications:  Prior to Admission medications   Medication Sig Start Date End Date Taking? Authorizing Provider  aspirin 81 MG tablet Take 81 mg by mouth daily.     Yes Historical Provider, MD  atorvastatin (LIPITOR) 80 MG tablet Take 1 tablet (80 mg total) by mouth daily. Patient taking differently: Take 40 mg by mouth daily.  01/19/15  Yes Antoine Poche, MD  carbidopa-levodopa (SINEMET CR) 50-200 MG  per tablet TAKE 1/2 TABLET BY MOUTH IN THE MORNING AND AT NOON, THEN 1 TABLET INTHE EVENING. 03/14/15  Yes York Spaniel, MD  doxazosin (CARDURA) 2 MG tablet Take 2 mg by mouth at bedtime.     Yes Historical Provider, MD  famotidine (PEPCID) 20 MG tablet TAKE (1) TABLET TWICE DAILY. 03/07/15  Yes Antoine Poche, MD  metoprolol tartrate (LOPRESSOR) 25 MG tablet TAKE (1) TABLET TWICE DAILY. 06/17/15  Yes Antoine Poche, MD  nitroGLYCERIN (NITROSTAT) 0.4 MG SL tablet Place 1 tablet (0.4 mg total) under the tongue every 5 (five) minutes as needed. 11/13/12   Yes Eugene C Serpe, PA-C  ropinirole (REQUIP) 5 MG tablet TAKE 1 TABLET BY MOUTH 3 TIMES DAILY. 04/01/15  Yes York Spaniel, MD  traZODone (DESYREL) 50 MG tablet Take 1 tablet by mouth daily. 10/20/12  Yes Historical Provider, MD  triamcinolone ointment (KENALOG) 0.1 % Apply 0.1 g topically daily as needed. 11/24/13  Yes Historical Provider, MD  carbidopa-levodopa (SINEMET IR) 25-100 MG tablet Take 0.5 tablets by mouth every morning. 08/15/15   York Spaniel, MD    ROS:  Out of a complete 14 system review of symptoms, the patient complains only of the following symptoms, and all other reviewed systems are negative.  Eye discharge, eye redness, blurred vision Bruising easily  Blood pressure 137/70, pulse 63, height 6' (1.829 m), weight 159 lb 8 oz (72.349 kg).  Physical Exam  General: The patient is alert and cooperative at the time of the examination.  Skin: No significant peripheral edema is noted.   Neurologic Exam  Mental status: The patient is alert and oriented x 3 at the time of the examination. The patient has apparent normal recent and remote memory, with an apparently normal attention span and concentration ability.   Cranial nerves: Facial symmetry is present. Speech is normal, no aphasia or dysarthria is noted. Extraocular movements are full. Visual fields are full. Mild masking of the face is seen.  Motor: The patient has good strength in all 4 extremities.  Sensory examination: Soft touch sensation is symmetric on the face, arms, and legs.  Coordination: The patient has good finger-nose-finger and heel-to-shin bilaterally. Resting tremors are noted with both upper and lower extremities.  Gait and station: The patient is able to arise from a seated position with arms crossed. Once up, the patient has a stooped posture, decreased arm swing bilaterally, tremor in both arms with walking. Otherwise, the patient takes good stride, has good turns. Tandem gait is normal.  Romberg is negative. No drift is seen.  Reflexes: Deep tendon reflexes are symmetric.   Assessment/Plan:  1. Parkinson's disease  2. Visual hallucinations  The patient is having difficulty tolerating is Sinemet medication secondary to visual hallucinations. We will try going up in the morning taking one half of a 25/100 mg short acting tablet. The patient will contact me has problems with this medication. A prescription for the Sinemet was called in. He will follow-up in 5 months, sooner if needed.  Marlan Palau MD 08/15/2015 12:41 PM  [facility] [address] [place], [place] [address]  Phone [phone] Fax [phone]

## 2015-08-18 ENCOUNTER — Ambulatory Visit: Payer: Medicare Other | Admitting: Cardiology

## 2015-09-05 ENCOUNTER — Telehealth: Payer: Self-pay | Admitting: *Deleted

## 2015-09-05 NOTE — Telephone Encounter (Signed)
Ok to hold aspirin as needed for surgery   J Branch MD 

## 2015-09-05 NOTE — Telephone Encounter (Signed)
Information faxed to [facility].

## 2015-09-15 ENCOUNTER — Other Ambulatory Visit: Payer: Self-pay | Admitting: Neurology

## 2015-09-15 MED ORDER — CARBIDOPA-LEVODOPA ER 50-200 MG PO TBCR: EXTENDED_RELEASE_TABLET | ORAL | Status: DC

## 2015-10-17 ENCOUNTER — Telehealth: Payer: Self-pay | Admitting: Neurology

## 2015-10-17 MED ORDER — QUETIAPINE FUMARATE 25 MG PO TABS: 25.0000 mg | ORAL_TABLET | Freq: Every day | ORAL | Status: DC

## 2015-10-17 NOTE — Telephone Encounter (Signed)
I called the daughter. The patient is having increasing problems with hallucinations but he also has a lot of delusional thinking, he believes that his neighbor is able to control his lights and his TV. I will give a trial on low-dose Seroquel, I'm not sure the delusional thinking will improve with this, but it may help the hallucinations. The patient will see children playing in the yard and out in the street.

## 2015-10-17 NOTE — Telephone Encounter (Signed)
Pt's daughter, Wonda ChengDebbra Oddel, called and would like to speak with Dr. Anne HahnWillis about her father. She is requesting a phone call or a meeting time. She is not on a DPR from what I saw. May call [phone] or [phone].

## 2015-10-17 NOTE — Telephone Encounter (Signed)
I checked and she is on DPR.  (in media).

## 2015-11-14 ENCOUNTER — Other Ambulatory Visit: Payer: Self-pay | Admitting: Neurology

## 2015-11-28 ENCOUNTER — Other Ambulatory Visit: Payer: Self-pay | Admitting: Neurology

## 2015-12-05 ENCOUNTER — Ambulatory Visit (INDEPENDENT_AMBULATORY_CARE_PROVIDER_SITE_OTHER): Payer: Medicare Other | Admitting: Cardiology

## 2015-12-05 ENCOUNTER — Encounter: Payer: Self-pay | Admitting: Cardiology

## 2015-12-05 VITALS — BP 120/70 | HR 61 | Ht 72.0 in | Wt 160.0 lb

## 2015-12-05 DIAGNOSIS — R0602 Shortness of breath: Secondary | ICD-10-CM

## 2015-12-05 DIAGNOSIS — R0789 Other chest pain: Secondary | ICD-10-CM

## 2015-12-05 DIAGNOSIS — I2510 Atherosclerotic heart disease of native coronary artery without angina pectoris: Secondary | ICD-10-CM

## 2015-12-05 DIAGNOSIS — I10 Essential (primary) hypertension: Secondary | ICD-10-CM

## 2015-12-05 NOTE — Progress Notes (Signed)
Patient ID: Jorge Lester, male   DOB: 1933/09/15, 82 y.o.   MRN: [id number]     Clinical Summary Jorge Lester is a 82 y.o.male seen today for follow up of the following medical problems.   1. CAD  - PCI to LAD CTO 12/2012 with DES x2  - 11/2012 echo LVEF 55-60%,  - recent SOB that started about 6 months ago.   can occur at rest or with activity. Can have some occasional LE edema. No cough or wheezing. Former tobacco x 25 years. Never had PFTs - no orthopnea, no PND - has had some chest pain over the last 6 months. Left sided sharp pain, 2/10. Can occur at rest or with exertion. Can be better with NG. Occurs 2-3 times a month. Symptoms last just a few minutes.  2. HTN  - does not check at home  - compliant with meds   3. HL  - compliant with statin - reports upcoming labs. He stopped lipitor on his own, and restarted simvastatin  daily.        Past Medical History  Diagnosis Date  . CAD (coronary artery disease)     a. CTO of LAD known since 2008, s/p DES x 2 to CTO 01/01/13. b. known residual nonobstructive disease in LCx, RCA.  [name] Hyperlipidemia   . Hypertension   . Parkinson's disease   . Mitral regurgitation     Prev mild, most recently trivial by echo 11/2012.  . Sleep apnea   . GERD (gastroesophageal reflux disease)   . Gout      Allergies  Allergen Reactions  . Amantadines     Hallucinations  . Sulfonamide Derivatives      Current Outpatient Prescriptions  Medication Sig Dispense Refill  . aspirin 81 MG tablet Take 81 mg by mouth daily.      [name] atorvastatin (LIPITOR) 80 MG tablet Take 1 tablet (80 mg total) by mouth daily. (Patient taking differently: Take 40 mg by mouth daily. ) 30 tablet 6  . carbidopa-levodopa (SINEMET CR) 50-200 MG tablet TAKE 1/2 TABLET BY MOUTH IN THE MORNING AND AT NOON, THEN 1 TABLET INTHE EVENING. 60 tablet 6  . carbidopa-levodopa (SINEMET IR) 25-100 MG tablet TAKE 1/2 TABLET BY MOUTH EVERY MORNING. 15 tablet 11  . doxazosin  (CARDURA) 2 MG tablet Take 2 mg by mouth at bedtime.      . famotidine (PEPCID) 20 MG tablet TAKE (1) TABLET TWICE DAILY. 60 tablet 3  . metoprolol tartrate (LOPRESSOR) 25 MG tablet TAKE (1) TABLET TWICE DAILY. 60 tablet 6  . nitroGLYCERIN (NITROSTAT) 0.4 MG SL tablet Place 1 tablet (0.4 mg total) under the tongue every 5 (five) minutes as needed. 25 tablet 3  . QUEtiapine (SEROQUEL) 25 MG tablet Take 1 tablet (25 mg total) by mouth at bedtime. 30 tablet 2  . ropinirole (REQUIP) 5 MG tablet TAKE 1 TABLET BY MOUTH 3 TIMES DAILY. 90 tablet 11  . traZODone (DESYREL) 50 MG tablet Take 1 tablet by mouth daily.    [name] triamcinolone ointment (KENALOG) 0.1 % Apply 0.1 g topically daily as needed.     No current facility-administered medications for this visit.     Past Surgical History  Procedure Laterality Date  . Tonsillectomy and adenoidectomy  1947  . Trigger finger release  1980's  . Vasectomy    . Cardiac catheterization  2008  . Coronary angioplasty with stent placement  01/01/2013    "2" (01/01/2013)  . Lumbar disc surgery  1974  . Percutaneous coronary stent intervention (pci-s) N/A 01/01/2013    Procedure: PERCUTANEOUS CORONARY STENT INTERVENTION (PCI-S);  Surgeon: Peter M SwazilandJordan, MD;  [facility];  Service: Cardiovascular;  Laterality: N/A;     Allergies  Allergen Reactions  . Amantadines     Hallucinations  . Sulfonamide Derivatives       Family History  Problem Relation Age of Onset  . Ovarian cancer Mother   . Coronary artery disease Father   . Alzheimer's disease Father   . Diabetes Other   . Transient ischemic attack Other   . Diabetes Brother      Social History Mr. Lordi reports that he quit smoking about 26 years ago. His smoking use included Cigarettes. He has a 88 pack-year smoking history. He has never used smokeless tobacco. [name] reports that he does not drink alcohol.   Review of Systems CONSTITUTIONAL: No weight loss, fever, chills,  weakness or fatigue.  HEENT: Eyes: No visual loss, blurred vision, double vision or yellow sclerae.No hearing loss, sneezing, congestion, runny nose or sore throat.  SKIN: No rash or itching.  CARDIOVASCULAR: per HPI RESPIRATORY:per HPI.  GASTROINTESTINAL: No anorexia, nausea, vomiting or diarrhea. No abdominal pain or blood.  GENITOURINARY: No burning on urination, no polyuria NEUROLOGICAL: No headache, dizziness, syncope, paralysis, ataxia, numbness or tingling in the extremities. No change in bowel or bladder control.  MUSCULOSKELETAL: No muscle, back pain, joint pain or stiffness.  LYMPHATICS: No enlarged nodes. No history of splenectomy.  PSYCHIATRIC: No history of depression or anxiety.  ENDOCRINOLOGIC: No reports of sweating, cold or heat intolerance. No polyuria or polydipsia.  [name].   Physical Examination Filed Vitals:   12/05/15 0805  BP: 120/70  Pulse: 61   Filed Vitals:   12/05/15 0805  Height: 6' (1.829 m)  Weight: 160 lb (72.576 kg)    Gen: resting comfortably, no acute distress HEENT: no scleral icterus, pupils equal round and reactive, no palptable cervical adenopathy,  CV: RRR, no m/r/g, no jvd Resp: Clear to auscultation bilaterally GI: abdomen is soft, non-tender, non-distended, normal bowel sounds, no hepatosplenomegaly MSK: extremities are warm, no edema.  Skin: warm, no rash Neuro:  no focal deficits Psych: appropriate affect   Diagnostic Studies  11/2012 Cath  Procedural Findings:  Hemodynamics:  AO 117/52 with a mean of 79 mmHg  LV 116/14 mmHg  Coronary angiography:  Coronary dominance: right  Left mainstem: The left main coronary is normal.  Left anterior descending (LAD): The left anterior descending artery is diffusely diseased in the proximal vessel up to 75%. It is occluded in the mid vessel. There are left to left collaterals from the ramus intermediate branch. There are also right-to-left collaterals via septal perforators to the  distal LAD. The proximal Is well-defined and there appears to be a small tract through the occlusion.  There is a ramus intermediate branch which is moderate to large. It has mild irregularities less than 20%.  Left circumflex (LCx): The left circumflex gives rise to a single bifurcating marginal branch. The proximal OM has a segmental 60% stenosis.  Right coronary artery (RCA): The right coronary is a large dominant vessel. It is diffusely diseased throughout with stenosis up to 40% in the mid vessel. There is significant plaque burden throughout without significant stenosis.  Left ventriculography: Left ventricular systolic function is normal, LVEF is estimated at 55-65%, there is no significant mitral regurgitation  Final Conclusions:  1. Single vessel occlusive coronary disease with chronic total  occlusion of the mid LAD.  2. Normal left ventricular function.  Recommendations: The patient has significant symptoms and ischemia on noninvasive testing despite 2 antianginal agents. His LAD CTO appears suitable for PCI. The alternative would be to increase his antianginal therapy. I will discuss options with the patient.   01/01/13  Lesion Data:  Vessel: LAD  Percent stenosis (pre): 100%  TIMI-flow (pre): 0  Stent: 2.5 x 38 and 2.75 x 12 mm Promus premier stents  Percent stenosis (post): 0%  TIMI-flow (post): 3  Conclusions:  Successful stenting of the mid LAD for CTO with drug-eluting stents.   11/2012 Echo  LVEF 55-60%, grade II diastolic dysfunction,    Assessment and Plan  1. CAD  -patient s/p DES to LAD CTO in June 2014 - EKG in clinic without ischemic changes - recent chest pain, SOB, and LE edema. Will obtain echo, pending results likely obtain exercise myoview  2. HTN  - at goal, we will continue current meds   3. Hyperlipidemia  - continue high dose statin in setting of known CAD   F/u pending test results. Pending echo likely exercise myoview.        Antoine Poche, M.D.

## 2015-12-05 NOTE — Patient Instructions (Signed)
Your physician recommends that you schedule a follow-up appointment To be determined after testing.  Your physician recommends that you continue on your current medications as directed. Please refer to the Current Medication list given to you today.  Your physician has requested that you have an echocardiogram. Echocardiography is a painless test that uses sound waves to create images of your heart. It provides your doctor with information about the size and shape of your heart and how well your heart's chambers and valves are working. This procedure takes approximately one hour. There are no restrictions for this procedure.  Thank you for choosing [facility]!!

## 2015-12-08 ENCOUNTER — Ambulatory Visit (INDEPENDENT_AMBULATORY_CARE_PROVIDER_SITE_OTHER): Payer: Medicare Other

## 2015-12-08 ENCOUNTER — Other Ambulatory Visit: Payer: Self-pay

## 2015-12-08 DIAGNOSIS — R0602 Shortness of breath: Secondary | ICD-10-CM | POA: Diagnosis not present

## 2015-12-20 ENCOUNTER — Telehealth: Payer: Self-pay | Admitting: *Deleted

## 2015-12-20 ENCOUNTER — Encounter: Payer: Self-pay | Admitting: *Deleted

## 2015-12-20 DIAGNOSIS — R079 Chest pain, unspecified: Secondary | ICD-10-CM

## 2015-12-20 NOTE — Telephone Encounter (Signed)
Pt aware and agreeable to stress test. Will place orders and forward to schedulers

## 2015-12-20 NOTE — Telephone Encounter (Signed)
-----   Message from Antoine PocheJonathan F Branch, MD sent at 12/13/2015 11:38 AM EDT ----- Echo look good, heart function remains normal. Given his symptoms I would like to get an exercise nuclear stress test to evaluate for new blockages, he needs to hold his beta blocker the day of the test.  Dominga FerryJ Branch MD

## 2015-12-27 ENCOUNTER — Encounter (HOSPITAL_COMMUNITY)
Admission: RE | Admit: 2015-12-27 | Discharge: 2015-12-27 | Disposition: A | Discharge status: Home / Self Care | Payer: Medicare Other | Source: Ambulatory Visit | Attending: Cardiology | Admitting: Cardiology

## 2015-12-27 ENCOUNTER — Inpatient Hospital Stay (HOSPITAL_COMMUNITY): Admission: RE | Admit: 2015-12-27 | Payer: Medicare Other | Source: Ambulatory Visit

## 2015-12-27 ENCOUNTER — Encounter (HOSPITAL_COMMUNITY): Payer: Self-pay

## 2015-12-27 DIAGNOSIS — R931 Abnormal findings on diagnostic imaging of heart and coronary circulation: Secondary | ICD-10-CM | POA: Diagnosis not present

## 2015-12-27 DIAGNOSIS — R079 Chest pain, unspecified: Secondary | ICD-10-CM | POA: Diagnosis not present

## 2015-12-27 DIAGNOSIS — M6289 Other specified disorders of muscle: Secondary | ICD-10-CM | POA: Insufficient documentation

## 2015-12-27 LAB — NM MYOCAR MULTI W/SPECT W/WALL MOTION / EF
CHL CUP MPHR: 141 {beats}/min
CHL CUP NUCLEAR SDS: 6
CHL CUP NUCLEAR SRS: 1
CHL CUP NUCLEAR SSS: 7
CSEPEW: 2.3 METS
CSEPHR: 65 %
Exercise duration (min): 1 min
Exercise duration (sec): 10 s
LHR: 0.33
LV sys vol: 36 mL
LVDIAVOL: 107 mL (ref 62–150)
Peak HR: 93 {beats}/min
RPE: 13
Rest HR: 68 {beats}/min
TID: 0.89

## 2015-12-27 MED ORDER — TECHNETIUM TC 99M TETROFOSMIN IV KIT
10.0000 | PACK | Freq: Once | INTRAVENOUS | Status: AC | PRN
  Administered 2015-12-27: 10.5 via INTRAVENOUS

## 2015-12-27 MED ORDER — TECHNETIUM TC 99M TETROFOSMIN IV KIT
30.0000 | PACK | Freq: Once | INTRAVENOUS | Status: AC | PRN
  Administered 2015-12-27: 30 via INTRAVENOUS

## 2015-12-27 MED ORDER — SODIUM CHLORIDE 0.9% FLUSH
INTRAVENOUS | Status: AC
  Filled 2015-12-27: qty 150

## 2015-12-27 MED ORDER — REGADENOSON 0.4 MG/5ML IV SOLN
INTRAVENOUS | Status: AC
  Administered 2015-12-27: 0.4 mg via INTRAVENOUS
  Filled 2015-12-27: qty 5

## 2015-12-27 MED ORDER — SODIUM CHLORIDE 0.9% FLUSH
INTRAVENOUS | Status: AC
  Administered 2015-12-27: 10 mL via INTRAVENOUS
  Filled 2015-12-27: qty 10

## 2016-01-04 ENCOUNTER — Encounter: Payer: Self-pay | Admitting: *Deleted

## 2016-01-04 ENCOUNTER — Telehealth: Payer: Self-pay | Admitting: *Deleted

## 2016-01-04 NOTE — Telephone Encounter (Signed)
-----   Message from Antoine PocheJonathan F Branch, MD sent at 12/28/2015 11:12 AM EDT ----- Stress test overall looks good. There are just some very small abnormal areas that are not enough to be of any significant risk. How are his symptoms doing? He should see me back in 2-3 weeks to discuss results in detail and see if any medication changes need to be made  Dominga FerryJ Branch MD

## 2016-01-04 NOTE — Telephone Encounter (Signed)
LM from 6/16-6/21. Mailed pt letter

## 2016-01-12 ENCOUNTER — Other Ambulatory Visit: Payer: Self-pay | Admitting: Cardiology

## 2016-01-13 ENCOUNTER — Encounter: Payer: Self-pay | Admitting: Neurology

## 2016-01-13 ENCOUNTER — Ambulatory Visit (INDEPENDENT_AMBULATORY_CARE_PROVIDER_SITE_OTHER): Payer: Medicare Other | Admitting: Neurology

## 2016-01-13 VITALS — BP 138/70 | HR 56 | Ht 72.0 in | Wt 155.5 lb

## 2016-01-13 DIAGNOSIS — G20 Parkinson's disease: Secondary | ICD-10-CM

## 2016-01-13 DIAGNOSIS — R269 Unspecified abnormalities of gait and mobility: Secondary | ICD-10-CM | POA: Diagnosis not present

## 2016-01-13 DIAGNOSIS — R441 Visual hallucinations: Secondary | ICD-10-CM | POA: Diagnosis not present

## 2016-01-13 MED ORDER — CARBIDOPA-LEVODOPA 25-100 MG PO TABS: ORAL_TABLET | ORAL | Status: DC

## 2016-01-13 MED ORDER — QUETIAPINE FUMARATE 25 MG PO TABS: 25.0000 mg | ORAL_TABLET | Freq: Every day | ORAL | Status: DC

## 2016-01-13 NOTE — Patient Instructions (Signed)
    With the Sinemet (carbidopa) 25/100 mg tablet start one full tablet in the morning for 3 weeks, then begin 1 tablet in the morning and 1/2 tablet at noon

## 2016-01-13 NOTE — Progress Notes (Signed)
Reason for visit: Parkinson's disease  Jorge GoodnessWilliam K Lester is an 82 y.o. male  History of present illness:  [name] is a 82 year old right-handed white male with a history of Parkinson's disease. The patient has an associated gait disorder. He has been sensitive to medications that have resulted in some hallucinations, particularly in the evening hours. He takes Seroquel at night, this seems to help. The patient indicates that he is not having any hallucinations at this time. He is taking the Sinemet CR 50/200 mg tablets 3 times daily, and he takes Sinemet 25/100 mg tablet in the morning, one half tablet. He is on Requip taking 5 mg 3 times daily. The patient has fallen twice recently, one fall occurred 2 days ago, another 2 weeks ago. He did not sustain significant injury. He has no steps or stairs inside the house, he has 2 steps going into the house, but he does have a railing. He lives alone, he operates a Librarian, academicmotor vehicle without difficulty. He indicates that he sleeps about 5 hours at night, but this seems to be enough rest for him. He denies any issues with vivid dreams at night. The patient returns this office for an evaluation.  Past Medical History  Diagnosis Date  . CAD (coronary artery disease)     a. CTO of LAD known since 2008, s/p DES x 2 to CTO 01/01/13. b. known residual nonobstructive disease in LCx, RCA.  [name]. Hyperlipidemia   . Hypertension   . Parkinson's disease   . Mitral regurgitation     Prev mild, most recently trivial by echo 11/2012.  . Sleep apnea   . GERD (gastroesophageal reflux disease)   . Gout     Past Surgical History  Procedure Laterality Date  . Tonsillectomy and adenoidectomy  1947  . Trigger finger release  1980's  . Vasectomy    . Cardiac catheterization  2008  . Coronary angioplasty with stent placement  01/01/2013    "2" (01/01/2013)  . Lumbar disc surgery  1974  . Percutaneous coronary stent intervention (pci-s) N/A 01/01/2013    Procedure:  PERCUTANEOUS CORONARY STENT INTERVENTION (PCI-S);  Surgeon: Peter M SwazilandJordan, MD;  [facility];  Service: Cardiovascular;  Laterality: N/A;    Family History  Problem Relation Age of Onset  . Ovarian cancer Mother   . Coronary artery disease Father   . Alzheimer's disease Father   . Diabetes Other   . Transient ischemic attack Other   . Diabetes Brother     Social history:  reports that he quit smoking about 26 years ago. His smoking use included Cigarettes. He started smoking about 70 years ago. He has a 88 pack-year smoking history. He has never used smokeless tobacco. He reports that he does not drink alcohol or use illicit drugs.    Allergies  Allergen Reactions  . Amantadines     Hallucinations  . Sulfonamide Derivatives     Medications:  Prior to Admission medications   Medication Sig Start Date End Date Taking? Authorizing Provider  aspirin 81 MG tablet Take 81 mg by mouth daily.      Historical Provider, MD  carbidopa-levodopa (SINEMET CR) 50-200 MG tablet TAKE 1/2 TABLET BY MOUTH IN THE MORNING AND AT NOON, THEN 1 TABLET INTHE EVENING. 09/15/15   York Spanielharles K Willis, MD  carbidopa-levodopa (SINEMET IR) 25-100 MG tablet TAKE 1/2 TABLET BY MOUTH EVERY MORNING. 11/14/15   York Spanielharles K Willis, MD  doxazosin (CARDURA) 2 MG tablet Take  2 mg by mouth at bedtime.      Historical Provider, MD  famotidine (PEPCID) 20 MG tablet TAKE (1) TABLET TWICE DAILY. 03/07/15   Antoine PocheJonathan F Branch, MD  metoprolol tartrate (LOPRESSOR) 25 MG tablet TAKE (1) TABLET TWICE DAILY. 01/12/16   Antoine PocheJonathan F Branch, MD  nitroGLYCERIN (NITROSTAT) 0.4 MG SL tablet Place 1 tablet (0.4 mg total) under the tongue every 5 (five) minutes as needed. 11/13/12   Rande BruntEugene C Serpe, PA-C  QUEtiapine (SEROQUEL) 25 MG tablet Take 1 tablet (25 mg total) by mouth at bedtime. 10/17/15   York Spanielharles K Willis, MD  ropinirole (REQUIP) 5 MG tablet TAKE 1 TABLET BY MOUTH 3 TIMES DAILY. 11/28/15   York Spanielharles K Willis, MD  simvastatin (ZOCOR) 40 MG  tablet Take 1 tablet by mouth daily. Changed by Dr. Margo Commonapper 11/28/15   Historical Provider, MD  traZODone (DESYREL) 50 MG tablet Take 1 tablet by mouth daily. 10/20/12   Historical Provider, MD  triamcinolone ointment (KENALOG) 0.1 % Apply 0.1 g topically daily as needed. 11/24/13   Historical Provider, MD    ROS:  Out of a complete 14 system review of symptoms, the patient complains only of the following symptoms, and all other reviewed systems are negative.  Gait disorder  Blood pressure 138/70, pulse 56, height 6' (1.829 m), weight 155 lb 8 oz (70.534 kg).  Physical Exam  General: The patient is alert and cooperative at the time of the examination.  Skin: No significant peripheral edema is noted.   Neurologic Exam  Mental status: The patient is alert and oriented x 3 at the time of the examination. The patient has apparent normal recent and remote memory, with an apparently normal attention span and concentration ability.   Cranial nerves: Facial symmetry is present. Speech is normal, no aphasia or dysarthria is noted. Extraocular movements are full. Visual fields are full. Slight masking of the face is seen.   Motor: The patient has good strength in all 4 extremities.  Sensory examination: Soft touch sensation is symmetric on the face, arms, and legs.  Coordination: The patient has good finger-nose-finger and heel-to-shin bilaterally.  Gait and station: The patient is able to arise from a seated position with arms crossed. Once up, he is able to ambulate independently, some decrease in arm swing, he has a stooped posture. Tandem gait is normal. Romberg is negative. No drift is seen.  Reflexes: Deep tendon reflexes are symmetric.   Assessment/Plan:  1. Parkinson's disease  2. Gait disorder  The patient will be placed on a slightly higher dose of Sinemet 25/100 mg tablets taking 1 tablet in the morning for 3 weeks, then take 1 the morning and one half at noon. He will remain  on the same dose of Sinemet CR and Requip. If the hallucinations return, we may back off of the Requip dose. The patient is falling on occasion, it appears that this is occurring mainly because of freezing episodes. He will follow-up in 5 months. I have encouraged him to remain quite active. He indicates that he does a lot of yard work. He has a walker, but he indicates that the walker typically gets ahead of him and causes problems. He believes that using a cane worsens his gait stability.  Marlan Palau. Keith Willis MD 01/13/2016 9:01 AM  [facility] [address] [place], [place] [id number]  Phone [phone] Fax [phone]

## 2016-01-24 ENCOUNTER — Ambulatory Visit (INDEPENDENT_AMBULATORY_CARE_PROVIDER_SITE_OTHER): Payer: Medicare Other | Admitting: Cardiology

## 2016-01-24 ENCOUNTER — Encounter: Payer: Self-pay | Admitting: Cardiology

## 2016-01-24 VITALS — BP 106/61 | HR 60 | Ht 72.0 in | Wt 159.0 lb

## 2016-01-24 DIAGNOSIS — I10 Essential (primary) hypertension: Secondary | ICD-10-CM | POA: Diagnosis not present

## 2016-01-24 DIAGNOSIS — R0602 Shortness of breath: Secondary | ICD-10-CM

## 2016-01-24 DIAGNOSIS — E785 Hyperlipidemia, unspecified: Secondary | ICD-10-CM | POA: Diagnosis not present

## 2016-01-24 DIAGNOSIS — I2510 Atherosclerotic heart disease of native coronary artery without angina pectoris: Secondary | ICD-10-CM

## 2016-01-24 NOTE — Patient Instructions (Signed)

## 2016-01-24 NOTE — Progress Notes (Signed)
Clinical Summary Mr. Kluger is a 82 y.o.male seen today for follow up of the following medical problems.   1. CAD  - PCI to LAD CTO 12/2012 with DES x2  - 11/2012 echo LVEF 55-60%,  - recent SOB that started about 6 months ago.  can occur at rest or with activity. Can have some occasional LE edema. No cough or wheezing. Former tobacco x 25 years. Never had PFTs - no orthopnea, no PND - has had some chest pain over the last 6 months. Left sided sharp pain, 2/10. Can occur at rest or with exertion. Can be better with NG. Occurs 2-3 times a month. Symptoms last just a few minutes.   - since last visit complete echo that showed LVEF 55-60%, grade I diastolic dysfunction.  - since last visit completed Lexiscan, showed small area of mild apical ischemia.   - no significant chest pain since last visit. SOB has improved as well.   2. HTN  - does not check at home  - compliant with meds   3. HL  - compliant with statin - reports upcoming labs. He stopped lipitor on his own due to his preference, and restarted simvastatin  daily.  Past Medical History  Diagnosis Date  . CAD (coronary artery disease)     a. CTO of LAD known since 2008, s/p DES x 2 to CTO 01/01/13. b. known residual nonobstructive disease in LCx, RCA.  [name] Hyperlipidemia   . Hypertension   . Parkinson's disease   . Mitral regurgitation     Prev mild, most recently trivial by echo 11/2012.  . Sleep apnea   . GERD (gastroesophageal reflux disease)   . Gout      Allergies  Allergen Reactions  . Amantadines     Hallucinations  . Sulfonamide Derivatives      Current Outpatient Prescriptions  Medication Sig Dispense Refill  . aspirin 81 MG tablet Take 81 mg by mouth daily. Reported on 01/13/2016    . carbidopa-levodopa (SINEMET CR) 50-200 MG tablet TAKE 1/2 TABLET BY MOUTH IN THE MORNING AND AT NOON, THEN 1 TABLET INTHE EVENING. 60 tablet 6  . carbidopa-levodopa (SINEMET IR) 25-100 MG tablet One tablet  in the morning and 1/2 tablet at noon 45 tablet 11  . doxazosin (CARDURA) 2 MG tablet Take 2 mg by mouth at bedtime.      . famotidine (PEPCID) 20 MG tablet TAKE (1) TABLET TWICE DAILY. 60 tablet 3  . metoprolol tartrate (LOPRESSOR) 25 MG tablet TAKE (1) TABLET TWICE DAILY. 60 tablet 3  . nitroGLYCERIN (NITROSTAT) 0.4 MG SL tablet Place 1 tablet (0.4 mg total) under the tongue every 5 (five) minutes as needed. 25 tablet 3  . QUEtiapine (SEROQUEL) 25 MG tablet Take 1 tablet (25 mg total) by mouth at bedtime. 30 tablet 5  . ropinirole (REQUIP) 5 MG tablet TAKE 1 TABLET BY MOUTH 3 TIMES DAILY. 90 tablet 11  . simvastatin (ZOCOR) 40 MG tablet Take 1 tablet by mouth daily. Changed by Dr. Margo Common    . traZODone (DESYREL) 50 MG tablet Take 1 tablet by mouth daily.    [name] triamcinolone ointment (KENALOG) 0.1 % Apply 0.1 g topically daily as needed.     No current facility-administered medications for this visit.     Past Surgical History  Procedure Laterality Date  . Tonsillectomy and adenoidectomy  1947  . Trigger finger release  1980's  . Vasectomy    . Cardiac catheterization  2008  . Coronary angioplasty with stent placement  01/01/2013    "2" (01/01/2013)  . Lumbar disc surgery  1974  . Percutaneous coronary stent intervention (pci-s) N/A 01/01/2013    Procedure: PERCUTANEOUS CORONARY STENT INTERVENTION (PCI-S);  Surgeon: Peter M Swaziland, MD;  Location: [facility] CATH LAB;  Service: Cardiovascular;  Laterality: N/A;     Allergies  Allergen Reactions  . Amantadines     Hallucinations  . Sulfonamide Derivatives       Family History  Problem Relation Age of Onset  . Ovarian cancer Mother   . Coronary artery disease Father   . Alzheimer's disease Father   . Diabetes Other   . Transient ischemic attack Other   . Diabetes Brother      Social History Mr. Ell reports that he quit smoking about 26 years ago. His smoking use included Cigarettes. He started smoking about 70 years ago. He has  a 88 pack-year smoking history. He has never used smokeless tobacco. Mr. Montesdeoca reports that he does not drink alcohol.   Review of Systems CONSTITUTIONAL: No weight loss, fever, chills, weakness or fatigue.  HEENT: Eyes: No visual loss, blurred vision, double vision or yellow sclerae.No hearing loss, sneezing, congestion, runny nose or sore throat.  SKIN: No rash or itching.  CARDIOVASCULAR: per HPI RESPIRATORY: No shortness of breath, cough or sputum.  GASTROINTESTINAL: No anorexia, nausea, vomiting or diarrhea. No abdominal pain or blood.  GENITOURINARY: No burning on urination, no polyuria NEUROLOGICAL: No headache, dizziness, syncope, paralysis, ataxia, numbness or tingling in the extremities. No change in bowel or bladder control.  MUSCULOSKELETAL: No muscle, back pain, joint pain or stiffness.  LYMPHATICS: No enlarged nodes. No history of splenectomy.  PSYCHIATRIC: No history of depression or anxiety.  ENDOCRINOLOGIC: No reports of sweating, cold or heat intolerance. No polyuria or polydipsia.  [name]   Physical Examination Filed Vitals:   01/24/16 1313  BP: 106/61  Pulse: 60   Filed Vitals:   01/24/16 1313  Height: 6' (1.829 m)  Weight: 159 lb (72.122 kg)    Gen: resting comfortably, no acute distress HEENT: no scleral icterus, pupils equal round and reactive, no palptable cervical adenopathy,  CV: RRR, no m/r/g, no jvd Resp: Clear to auscultation bilaterally GI: abdomen is soft, non-tender, non-distended, normal bowel sounds, no hepatosplenomegaly MSK: extremities are warm, no edema.  Skin: warm, no rash Neuro:  no focal deficits Psych: appropriate affect   Diagnostic Studies 11/2012 Cath  Procedural Findings:  Hemodynamics:  AO 117/52 with a mean of 79 mmHg  LV 116/14 mmHg  Coronary angiography:  Coronary dominance: right  Left mainstem: The left main coronary is normal.  Left anterior descending (LAD): The left anterior descending artery is diffusely  diseased in the proximal vessel up to 75%. It is occluded in the mid vessel. There are left to left collaterals from the ramus intermediate Azizah Lisle. There are also right-to-left collaterals via septal perforators to the distal LAD. The proximal Is well-defined and there appears to be a small tract through the occlusion.  There is a ramus intermediate Jawann Urbani which is moderate to large. It has mild irregularities less than 20%.  Left circumflex (LCx): The left circumflex gives rise to a single bifurcating marginal Earlie Arciga. The proximal OM has a segmental 60% stenosis.  Right coronary artery (RCA): The right coronary is a large dominant vessel. It is diffusely diseased throughout with stenosis up to 40% in the mid vessel. There is significant plaque burden throughout without significant stenosis.  Left ventriculography: Left ventricular systolic function is normal, LVEF is estimated at 55-65%, there is no significant mitral regurgitation  Final Conclusions:  1. Single vessel occlusive coronary disease with chronic total occlusion of the mid LAD.  2. Normal left ventricular function.  Recommendations: The patient has significant symptoms and ischemia on noninvasive testing despite 2 antianginal agents. His LAD CTO appears suitable for PCI. The alternative would be to increase his antianginal therapy. I will discuss options with the patient.   01/01/13  Lesion Data:  Vessel: LAD  Percent stenosis (pre): 100%  TIMI-flow (pre): 0  Stent: 2.5 x 38 and 2.75 x 12 mm Promus premier stents  Percent stenosis (post): 0%  TIMI-flow (post): 3  Conclusions:  Successful stenting of the mid LAD for CTO with drug-eluting stents.   11/2012 Echo  LVEF 55-60%, grade II diastolic dysfunction,   11/2015 echo Study Conclusions  - Left ventricle: The cavity size was normal. Wall thickness was  normal. Systolic function was normal. The estimated ejection  fraction was in the range of 55% to 60%.  Wall motion was normal;  there were no regional wall motion abnormalities. Doppler  parameters are consistent with abnormal left ventricular  relaxation (grade 1 diastolic dysfunction). - Aortic valve: There was mild regurgitation. Valve area (VTI):  2.49 cm^2. Valve area (Vmax): 2.39 cm^2. Valve area (Vmean): 2.42  cm^2. - Mitral valve: There was mild regurgitation. - Right ventricle: The cavity size was mildly dilated. - Right atrium: The atrium was mildly dilated. - Technically adequate study.  12/2015 MPI Perfusion Summary Defect 1:  There is a small defect of mild severity present in the apical anterior location. The defect is partially reversible. Small, mild intensity, partially reversible apical anterior defect suggesting a minor region of ischemia.  Defect 2:  There is a small defect of mild severity present in the mid inferoseptal and apical inferior location. The defect is non-reversible. Small, mild intensity, mid to apical inferior/inferoseptal defect consistent with soft tissue attenuation.    Overall Study Impression Myocardial perfusion is abnormal. This is a low risk study. Overall left ventricular systolic function was normal. Nuclear stress EF: 66%      Assessment and Plan  1. CAD  -patient s/p DES to LAD CTO in June 2014 - recent stress test without significant ischemia, echo with normal LVEF - previous symptoms of chest pain and SOB have resolved - continue current meds  2. HTN  - at goal, he will continue current meds   3. Hyperlipidemia  - he changed back to simva 40mg  on his own, no side effects on atorva 80 but uncomfortable taking that dose. We discussed the current guidelines regarding high dose statins however he continues to favor simva 40, we will conitnue  4. SOB - symptoms improved. We discussed PFTs given his long tobacco history but he is not infavor of at this time  F/u 6 months      Antoine PocheJonathan F. Jelan Batterton, M.D.

## 2016-02-15 ENCOUNTER — Telehealth: Payer: Self-pay | Admitting: Neurology

## 2016-02-15 NOTE — Telephone Encounter (Signed)
FYI-Patient is calling to advise that his condition is a little better and he is pleased. The patient does not need a returned call.

## 2016-02-27 ENCOUNTER — Telehealth: Payer: Self-pay | Admitting: Neurology

## 2016-02-27 NOTE — Telephone Encounter (Addendum)
  Chart reviewed, patient was last seen in January 13 2016 for Parkinson's disease. Per record, He has been sensitive to medications that have resulted in some hallucinations, particularly in the evening hours. He takes Seroquel at night, this seems to help. The patient indicates that he is not having any hallucinations at this time. He is taking the Sinemet CR 50/200 mg tablets 3 times daily, and he takes Sinemet 25/100 mg tablet in the morning,  The plan was to: He will be placed on a slightly higher dose of Sinemet 25/100 mg tablets taking 1 tablet in the morning for 3 weeks, then take 1 the morning and one half at noon. He will remain on the same dose of Sinemet CR and Requip. If the hallucinations return, we may back off of the Requip dose  Please advise patient to tapering off Requip 5 mg twice a day for 3 days, once every night for 3 days then stop  If he still has worsening agitation, may call back, for an early appointment, or appointment with nurse practitioner.

## 2016-02-27 NOTE — Telephone Encounter (Signed)
Returned call to pt's daughter. Gave instructions for tapering off of Requip.Will call back if symptoms worsen or do not improve. Verbalized understanding and appreciation for cal.

## 2016-02-27 NOTE — Telephone Encounter (Signed)
Pt's daughter called and says that pts hallucinations and paranoia have gotten worse. Would like to know if an increase in medication is needed or a new medication. Please call [phone]

## 2016-03-27 NOTE — Telephone Encounter (Signed)
I called the patient, talk with the daughter. The patient is doing somewhat better with his walking, he is not having is much of a problem with delusional thinking or hallucinations. At the moment, he is functioning fairly well. They are to call if any concerns arise.

## 2016-03-27 NOTE — Telephone Encounter (Signed)
Pt called to advise he stopped taking requip as advised but his legs starting shaking again. He said he started taking the medication a week after he stopped taking it -1tab in the am and 1tab in the pm and the shaking has stopped. He said the hallucinations are nearly to none. He feels he is doing good. He said he does not need a return phone call but I advised the RN would need to touch base. Please call

## 2016-04-09 ENCOUNTER — Other Ambulatory Visit: Payer: Self-pay | Admitting: Neurology

## 2016-05-10 ENCOUNTER — Other Ambulatory Visit: Payer: Self-pay | Admitting: Neurology

## 2016-06-26 ENCOUNTER — Encounter: Payer: Self-pay | Admitting: Neurology

## 2016-06-26 ENCOUNTER — Ambulatory Visit (INDEPENDENT_AMBULATORY_CARE_PROVIDER_SITE_OTHER): Payer: Medicare Other | Admitting: Neurology

## 2016-06-26 VITALS — BP 118/68 | HR 58 | Ht 72.0 in | Wt 149.5 lb

## 2016-06-26 DIAGNOSIS — R441 Visual hallucinations: Secondary | ICD-10-CM

## 2016-06-26 DIAGNOSIS — G20 Parkinson's disease: Secondary | ICD-10-CM | POA: Diagnosis not present

## 2016-06-26 DIAGNOSIS — R269 Unspecified abnormalities of gait and mobility: Secondary | ICD-10-CM | POA: Diagnosis not present

## 2016-06-26 MED ORDER — CARBIDOPA-LEVODOPA 25-100 MG PO TABS: ORAL_TABLET | ORAL | 11 refills | Status: DC

## 2016-06-26 MED ORDER — ROPINIROLE HCL 5 MG PO TABS: ORAL_TABLET | ORAL | Status: DC

## 2016-06-26 NOTE — Patient Instructions (Signed)
   With the Sinemet (carbidopa) 25/100 mg tablet, increase to 1 tablet in the morning and midday and 1/2 tablet in the evening.

## 2016-06-26 NOTE — Progress Notes (Signed)
Reason for visit: Parkinson's disease  Jorge Lester is an 82 y.o. male  History of present illness:  Jorge Lester is an 82 year old right-handed white male with a history of Parkinson's disease. The patient is getting overbalanced at times, he will fall when this happens. The patient has had 3 falls since last seen. The patient had an adjustment in his medications on the last visit, this seemed to help his functional level. The patient walks much better outside than he does inside. He does have some problems with choking at times. In the past he has had some hallucinations, he still is on low-dose Seroquel at night. He is taking trazodone 100 mg in the evening which has helped him sleep better and function better during the daytime. The patient has cut back on the Requip taking 5 mg at night, this helps his restless leg syndrome. He returns for an evaluation.  Past Medical History:  Diagnosis Date  . CAD (coronary artery disease)    a. CTO of LAD known since 2008, s/p DES x 2 to CTO 01/01/13. b. known residual nonobstructive disease in LCx, RCA.  [name]. GERD (gastroesophageal reflux disease)   . Gout   . Hyperlipidemia   . Hypertension   . Mitral regurgitation    Prev mild, most recently trivial by echo 11/2012.  [name]. Parkinson's disease   . Sleep apnea     Past Surgical History:  Procedure Laterality Date  . CARDIAC CATHETERIZATION  2008  . CORONARY ANGIOPLASTY WITH STENT PLACEMENT  01/01/2013   "2" (01/01/2013)  . LUMBAR DISC SURGERY  1974  . PERCUTANEOUS CORONARY STENT INTERVENTION (PCI-S) N/A 01/01/2013   Procedure: PERCUTANEOUS CORONARY STENT INTERVENTION (PCI-S);  Surgeon: Peter M SwazilandJordan, MD;  [facility];  Service: Cardiovascular;  Laterality: N/A;  . TONSILLECTOMY AND ADENOIDECTOMY  1947  . TRIGGER FINGER RELEASE  1980's  . VASECTOMY      Family History  Problem Relation Age of Onset  . Ovarian cancer Mother   . Coronary artery disease Father   . Alzheimer's  disease Father   . Diabetes Brother   . Diabetes Other   . Transient ischemic attack Other     Social history:  reports that he quit smoking about 26 years ago. His smoking use included Cigarettes. He started smoking about 70 years ago. He has a 88.00 pack-year smoking history. He has never used smokeless tobacco. He reports that he does not drink alcohol or use drugs.    Allergies  Allergen Reactions  . Amantadines     Hallucinations  . Sulfonamide Derivatives     Medications:  Prior to Admission medications   Medication Sig Start Date End Date Taking? Authorizing Provider  aspirin 81 MG tablet Take 81 mg by mouth daily. Reported on 01/24/2016   Yes Historical Provider, MD  carbidopa-levodopa (SINEMET CR) 50-200 MG tablet TAKE 1/2 TABLET BY MOUTH IN THE MORNING AND AT NOON, THEN 1 TABLET INTHE EVENING. 05/10/16  Yes York Spanielharles K Sahory Nordling, MD  carbidopa-levodopa (SINEMET IR) 25-100 MG tablet One tablet in the morning and 1/2 tablet at noon 01/13/16  Yes York Spanielharles K Kimm Ungaro, MD  doxazosin (CARDURA) 2 MG tablet Take 2 mg by mouth at bedtime.     Yes Historical Provider, MD  famotidine (PEPCID) 20 MG tablet TAKE (1) TABLET TWICE DAILY. 03/07/15  Yes Antoine PocheJonathan F Branch, MD  metoprolol tartrate (LOPRESSOR) 25 MG tablet TAKE (1) TABLET TWICE DAILY. 01/12/16  Yes Antoine PocheJonathan F Branch, MD  nitroGLYCERIN (NITROSTAT) 0.4 MG SL tablet Place 1 tablet (0.4 mg total) under the tongue every 5 (five) minutes as needed. 11/13/12  Yes Eugene C Serpe, PA-C  QUEtiapine (SEROQUEL) 25 MG tablet Take 1 tablet (25 mg total) by mouth at bedtime. 01/13/16  Yes York Spanielharles K Trampas Stettner, MD  ropinirole (REQUIP) 5 MG tablet TAKE 1 TABLET BY MOUTH 3 TIMES DAILY. Patient taking differently: TAKE 1 TABLET BY MOUTH BEFORE BEDTIME 11/28/15  Yes York Spanielharles K Kawanna Christley, MD  simvastatin (ZOCOR) 40 MG tablet Take 1 tablet by mouth daily. Changed by Dr. Margo Commonapper 11/28/15  Yes Historical Provider, MD  traZODone (DESYREL) 50 MG tablet Take 1 tablet by mouth  daily. 10/20/12  Yes Historical Provider, MD  triamcinolone ointment (KENALOG) 0.1 % Apply 0.1 g topically daily as needed. 11/24/13  Yes Historical Provider, MD    ROS:  Out of a complete 14 system review of symptoms, the patient complains only of the following symptoms, and all other reviewed systems are negative.  Gait disorder Restless legs  Blood pressure 118/68, pulse (!) 58, height 6' (1.829 m), weight 149 lb 8 oz (67.8 kg).  Physical Exam  General: The patient is alert and cooperative at the time of the examination.  Skin: No significant peripheral edema is noted.   Neurologic Exam  Mental status: The patient is alert and oriented x 3 at the time of the examination. The patient has apparent normal recent and remote memory, with an apparently normal attention span and concentration ability.   Cranial nerves: Facial symmetry is present. Speech is normal, no aphasia or dysarthria is noted. Extraocular movements are full. Visual fields are full. Masking of the face is seen. A jaw tremor is noted.  Motor: The patient has good strength in all 4 extremities.  Sensory examination: Soft touch sensation is symmetric on the face, arms, and legs.  Coordination: The patient has good finger-nose-finger and heel-to-shin bilaterally.  Gait and station: The patient has the ability to stand from a seated position with arms crossed. Once up, he is able to walk without assistance, he has good stride, minimal hesitation with turns. Tandem gait is normal. Romberg is negative. No drift is seen.  Reflexes: Deep tendon reflexes are symmetric.   Assessment/Plan:  1. Parkinson's disease  2. Gait disorder  3. Restless leg syndrome  The patient will be increased slightly on his Sinemet taking the 25/100 mg tablet, one tablet in the morning and midday, one half in the evening. He will call if this results in any confusion or hallucinations. The patient will follow-up in about 5 months, sooner if  needed.  Jorge Palau. Keith Kalyb Pemble MD 06/26/2016 11:27 AM  [facility] [address] [place], [place] [id number]  Phone [phone] Fax [phone]

## 2016-07-10 ENCOUNTER — Other Ambulatory Visit: Payer: Self-pay | Admitting: Cardiology

## 2016-07-10 ENCOUNTER — Other Ambulatory Visit: Payer: Self-pay | Admitting: Neurology

## 2016-07-25 ENCOUNTER — Ambulatory Visit: Payer: Medicare Other | Admitting: Cardiology

## 2016-07-27 ENCOUNTER — Ambulatory Visit (INDEPENDENT_AMBULATORY_CARE_PROVIDER_SITE_OTHER): Payer: Medicare Other | Admitting: Cardiology

## 2016-07-27 ENCOUNTER — Encounter: Payer: Self-pay | Admitting: Cardiology

## 2016-07-27 VITALS — BP 98/58 | HR 58 | Ht 72.0 in | Wt 149.0 lb

## 2016-07-27 DIAGNOSIS — I10 Essential (primary) hypertension: Secondary | ICD-10-CM

## 2016-07-27 DIAGNOSIS — E782 Mixed hyperlipidemia: Secondary | ICD-10-CM

## 2016-07-27 DIAGNOSIS — I2510 Atherosclerotic heart disease of native coronary artery without angina pectoris: Secondary | ICD-10-CM | POA: Diagnosis not present

## 2016-07-27 NOTE — Progress Notes (Signed)
Clinical Summary Jorge Lester is a 83 y.o.male seen today for follow up of the following medical problems.   1. CAD  - PCI to LAD CTO 12/2012 with DES x2  - 11/2012 echo LVEF 55-60%,  - 12/2015 nuclear stress low risk, possible mild ischemia  - no recent chest pain. Occasinal SOB at times. No LE edema - compliant with meds. Medical therapy limited by soft bp's, orthostatic symptoms.    2. HTN  - does not check at home regularly - compliant with meds   3. HL  - compliant with statin - reports upcoming labs. He stopped lipitor on his own due to his preference, and restarted simvastatin 40mg  daily.  - 11/2015 TG 73 TC 121 HDL 63 LDL 53  Past Medical History:  Diagnosis Date  . CAD (coronary artery disease)    a. CTO of LAD known since 2008, s/p DES x 2 to CTO 01/01/13. b. known residual nonobstructive disease in LCx, RCA.  [name] GERD (gastroesophageal reflux disease)   . Gout   . Hyperlipidemia   . Hypertension   . Mitral regurgitation    Prev mild, most recently trivial by echo 11/2012.  [name] Parkinson's disease   . Sleep apnea      Allergies  Allergen Reactions  . Amantadines     Hallucinations  . Sulfonamide Derivatives      Current Outpatient Prescriptions  Medication Sig Dispense Refill  . aspirin 81 MG tablet Take 81 mg by mouth daily. Reported on 01/24/2016    . carbidopa-levodopa (SINEMET CR) 50-200 MG tablet TAKE 1/2 TABLET BY MOUTH IN THE MORNING AND AT NOON, THEN 1 TABLET INTHE EVENING. 60 tablet 1  . carbidopa-levodopa (SINEMET IR) 25-100 MG tablet One tablet in the morning and at noon, 1/2 tablet in the evening 90 tablet 11  . doxazosin (CARDURA) 2 MG tablet Take 2 mg by mouth at bedtime.      . famotidine (PEPCID) 20 MG tablet TAKE (1) TABLET TWICE DAILY. 60 tablet 3  . metoprolol tartrate (LOPRESSOR) 25 MG tablet TAKE (1) TABLET TWICE DAILY. 60 tablet 6  . nitroGLYCERIN (NITROSTAT) 0.4 MG SL tablet Place 1 tablet (0.4 mg total) under the tongue every 5  (five) minutes as needed. 25 tablet 3  . QUEtiapine (SEROQUEL) 25 MG tablet TAKE (1) TABLET DAILY AT BEDTIME. 30 tablet 5  . ropinirole (REQUIP) 5 MG tablet TAKE 1 TABLET BY MOUTH BEFORE BEDTIME    . simvastatin (ZOCOR) 40 MG tablet Take 1 tablet by mouth daily. Changed by Dr. Margo Common    . traZODone (DESYREL) 50 MG tablet Take 1 tablet by mouth daily.    [name] triamcinolone ointment (KENALOG) 0.1 % Apply 0.1 g topically daily as needed.     No current facility-administered medications for this visit.      Past Surgical History:  Procedure Laterality Date  . CARDIAC CATHETERIZATION  2008  . CORONARY ANGIOPLASTY WITH STENT PLACEMENT  01/01/2013   "2" (01/01/2013)  . LUMBAR DISC SURGERY  1974  . PERCUTANEOUS CORONARY STENT INTERVENTION (PCI-S) N/A 01/01/2013   Procedure: PERCUTANEOUS CORONARY STENT INTERVENTION (PCI-S);  Surgeon: Peter M Swaziland, MD;  Location: [facility] CATH LAB;  Service: Cardiovascular;  Laterality: N/A;  . TONSILLECTOMY AND ADENOIDECTOMY  1947  . TRIGGER FINGER RELEASE  1980's  . VASECTOMY       Allergies  Allergen Reactions  . Amantadines     Hallucinations  . Sulfonamide Derivatives       Family  History  Problem Relation Age of Onset  . Ovarian cancer Mother   . Coronary artery disease Father   . Alzheimer's disease Father   . Diabetes Brother   . Diabetes Other   . Transient ischemic attack Other      Social History Jorge Lester reports that he quit smoking about 27 years ago. His smoking use included Cigarettes. He started smoking about 71 years ago. He has a 88.00 pack-year smoking history. He has never used smokeless tobacco. Jorge Lester reports that he does not drink alcohol.   Review of Systems CONSTITUTIONAL: No weight loss, fever, chills, weakness or fatigue.  HEENT: Eyes: No visual loss, blurred vision, double vision or yellow sclerae.No hearing loss, sneezing, congestion, runny nose or sore throat.  SKIN: No rash or itching.  CARDIOVASCULAR: per  hpi RESPIRATORY: No shortness of breath, cough or sputum.  GASTROINTESTINAL: No anorexia, nausea, vomiting or diarrhea. No abdominal pain or blood.  GENITOURINARY: No burning on urination, no polyuria NEUROLOGICAL: No headache, dizziness, syncope, paralysis, ataxia, numbness or tingling in the extremities. No change in bowel or bladder control.  MUSCULOSKELETAL: No muscle, back pain, joint pain or stiffness.  LYMPHATICS: No enlarged nodes. No history of splenectomy.  PSYCHIATRIC: No history of depression or anxiety.  ENDOCRINOLOGIC: No reports of sweating, cold or heat intolerance. No polyuria or polydipsia.  [name]   Physical Examination Vitals:   07/27/16 0828  BP: (!) 98/58  Pulse: (!) 58   Vitals:   07/27/16 0828  Weight: 149 lb (67.6 kg)  Height: 6' (1.829 m)    Gen: resting comfortably, no acute distress HEENT: no scleral icterus, pupils equal round and reactive, no palptable cervical adenopathy,  CV: RRR, no m/r/g, no jvd Resp: Clear to auscultation bilaterally GI: abdomen is soft, non-tender, non-distended, normal bowel sounds, no hepatosplenomegaly MSK: extremities are warm, no edema.  Skin: warm, no rash Neuro:  no focal deficits Psych: appropriate affect   Diagnostic Studies  11/2012 Cath  Procedural Findings:  Hemodynamics:  AO 117/52 with a mean of 79 mmHg  LV 116/14 mmHg  Coronary angiography:  Coronary dominance: right  Left mainstem: The left main coronary is normal.  Left anterior descending (LAD): The left anterior descending artery is diffusely diseased in the proximal vessel up to 75%. It is occluded in the mid vessel. There are left to left collaterals from the ramus intermediate Jorge Lester. There are also right-to-left collaterals via septal perforators to the distal LAD. The proximal Is well-defined and there appears to be a small tract through the occlusion.  There is a ramus intermediate Jorge Lester which is moderate to large. It has mild  irregularities less than 20%.  Left circumflex (LCx): The left circumflex gives rise to a single bifurcating marginal Jorge Lester. The proximal OM has a segmental 60% stenosis.  Right coronary artery (RCA): The right coronary is a large dominant vessel. It is diffusely diseased throughout with stenosis up to 40% in the mid vessel. There is significant plaque burden throughout without significant stenosis.  Left ventriculography: Left ventricular systolic function is normal, LVEF is estimated at 55-65%, there is no significant mitral regurgitation  Final Conclusions:  1. Single vessel occlusive coronary disease with chronic total occlusion of the mid LAD.  2. Normal left ventricular function.  Recommendations: The patient has significant symptoms and ischemia on noninvasive testing despite 2 antianginal agents. His LAD CTO appears suitable for PCI. The alternative would be to increase his antianginal therapy. I will discuss options with the patient.  01/01/13  Lesion Data:  Vessel: LAD  Percent stenosis (pre): 100%  TIMI-flow (pre): 0  Stent: 2.5 x 38 and 2.75 x 12 mm Promus premier stents  Percent stenosis (post): 0%  TIMI-flow (post): 3  Conclusions:  Successful stenting of the mid LAD for CTO with drug-eluting stents.   11/2012 Echo  LVEF 55-60%, grade II diastolic dysfunction,   11/2015 echo Study Conclusions  - Left ventricle: The cavity size was normal. Wall thickness was  normal. Systolic function was normal. The estimated ejection  fraction was in the range of 55% to 60%. Wall motion was normal;  there were no regional wall motion abnormalities. Doppler  parameters are consistent with abnormal left ventricular  relaxation (grade 1 diastolic dysfunction). - Aortic valve: There was mild regurgitation. Valve area (VTI):  2.49 cm^2. Valve area (Vmax): 2.39 cm^2. Valve area (Vmean): 2.42  cm^2. - Mitral valve: There was mild regurgitation. - Right  ventricle: The cavity size was mildly dilated. - Right atrium: The atrium was mildly dilated. - Technically adequate study.  12/2015 MPI Perfusion Summary Defect 1:  There is a small defect of mild severity present in the apical anterior location. The defect is partially reversible. Small, mild intensity, partially reversible apical anterior defect suggesting a minor region of ischemia.  Defect 2:  There is a small defect of mild severity present in the mid inferoseptal and apical inferior location. The defect is non-reversible. Small, mild intensity, mid to apical inferior/inferoseptal defect consistent with soft tissue attenuation.    Overall Study Impression Myocardial perfusion is abnormal. This is a low risk study. Overall left ventricular systolic function was normal. Nuclear stress EF: 66%        Assessment and Plan   1. CAD  -patient s/p DES to LAD CTO in June 2014 - recent stress test without significant ischemia, echo with normal LVEF - no recent symptoms.  - continue current meds  2. HTN  - he is at goal, continue current meds   3. Hyperlipidemia  - he changed back to simva 40mg  on his own, no side effects on atorva 80 but uncomfortable taking that dose.  - conntinue current meds   F/u 6 months     Antoine PocheJonathan F. Latoy Labriola, M.D.

## 2016-07-27 NOTE — Patient Instructions (Signed)

## 2016-08-06 ENCOUNTER — Telehealth: Payer: Self-pay | Admitting: Cardiology

## 2016-08-06 ENCOUNTER — Other Ambulatory Visit: Payer: Self-pay | Admitting: Neurology

## 2016-08-06 DIAGNOSIS — R0602 Shortness of breath: Secondary | ICD-10-CM

## 2016-08-06 NOTE — Telephone Encounter (Signed)
Please order [facility] to be [facility] at [facility] for SOB   Jorge FerryJ [name] MD

## 2016-08-06 NOTE — Telephone Encounter (Signed)
PFT's were discussed at [facility] in July 2017.  Was recently seen 07/27/16 & this was not mentioned.

## 2016-08-06 NOTE — Telephone Encounter (Signed)
[name] called stating that Dr.Branch told him when he wanted to set up a breathing test to let him know.  [name] is ready to be scheduled.

## 2016-08-07 NOTE — Telephone Encounter (Signed)
Orders place - forwarded to schedulers

## 2016-08-09 NOTE — Telephone Encounter (Signed)
PFT's scheduled for 2/7 per schedulers

## 2016-08-22 ENCOUNTER — Ambulatory Visit (HOSPITAL_COMMUNITY)
Admission: RE | Admit: 2016-08-22 | Discharge: 2016-08-22 | Disposition: A | Discharge status: Home / Self Care | Payer: Medicare Other | Source: Ambulatory Visit | Attending: Cardiology | Admitting: Cardiology

## 2016-08-22 DIAGNOSIS — R0602 Shortness of breath: Secondary | ICD-10-CM | POA: Diagnosis present

## 2016-08-22 DIAGNOSIS — J449 Chronic obstructive pulmonary disease, unspecified: Secondary | ICD-10-CM | POA: Insufficient documentation

## 2016-08-22 LAB — PULMONARY FUNCTION TEST
DL/VA % pred: 55 %
DL/VA: 2.61 ml/min/mmHg/L
DLCO UNC: 16.33 ml/min/mmHg
DLCO unc % pred: 46 %
FEF 25-75 Post: 1.61 L/sec
FEF 25-75 Pre: 1.34 L/sec
FEF2575-%Change-Post: 19 %
FEF2575-%PRED-POST: 75 %
FEF2575-%Pred-Pre: 62 %
FEV1-%CHANGE-POST: 1 %
FEV1-%PRED-POST: 78 %
FEV1-%PRED-PRE: 77 %
FEV1-POST: 2.44 L
FEV1-Pre: 2.41 L
FEV1FVC-%Change-Post: 2 %
FEV1FVC-%PRED-PRE: 93 %
FEV6-%Change-Post: -1 %
FEV6-%PRED-POST: 87 %
FEV6-%PRED-PRE: 89 %
FEV6-POST: 3.57 L
FEV6-PRE: 3.63 L
FEV6FVC-%PRED-POST: 106 %
FEV6FVC-%PRED-PRE: 106 %
FVC-%Change-Post: -1 %
FVC-%PRED-POST: 82 %
FVC-%PRED-PRE: 83 %
FVC-PRE: 3.64 L
FVC-Post: 3.58 L
PRE FEV6/FVC RATIO: 100 %
Post FEV1/FVC ratio: 68 %
Post FEV6/FVC ratio: 100 %
Pre FEV1/FVC ratio: 66 %
RV % PRED: 108 %
RV: 3.01 L
TLC % pred: 87 %
TLC: 6.52 L

## 2016-08-22 MED ORDER — ALBUTEROL SULFATE (2.5 MG/3ML) 0.083% IN NEBU
2.5000 mg | INHALATION_SOLUTION | Freq: Once | RESPIRATORY_TRACT | Status: AC
  Administered 2016-08-22: 2.5 mg via RESPIRATORY_TRACT

## 2016-08-28 ENCOUNTER — Telehealth: Payer: Self-pay | Admitting: *Deleted

## 2016-08-28 MED ORDER — TIOTROPIUM BROMIDE MONOHYDRATE 18 MCG IN CAPS: 18.0000 ug | ORAL_CAPSULE | Freq: Four times a day (QID) | RESPIRATORY_TRACT | 6 refills | Status: DC | PRN

## 2016-08-28 NOTE — Telephone Encounter (Signed)
-----   Message from Antoine PocheJonathan F Branch, MD sent at 08/27/2016  2:57 PM EST ----- PFTs show evidence of COPD. Please start him on spiriva 18 mcg daily and prn q6 hrs albuterol inhaler. Please forward results to pcp  J BrancH MD

## 2016-08-28 NOTE — Telephone Encounter (Signed)
Pt walked into office made aware of results - spiriva sent to pharmacy and routed results to pcp for f/u - pt voiced understanding

## 2016-11-06 ENCOUNTER — Other Ambulatory Visit: Payer: Self-pay | Admitting: Neurology

## 2016-11-27 ENCOUNTER — Encounter (INDEPENDENT_AMBULATORY_CARE_PROVIDER_SITE_OTHER): Payer: Self-pay

## 2016-11-27 ENCOUNTER — Encounter: Payer: Self-pay | Admitting: Neurology

## 2016-11-27 ENCOUNTER — Ambulatory Visit (INDEPENDENT_AMBULATORY_CARE_PROVIDER_SITE_OTHER): Payer: Medicare Other | Admitting: Neurology

## 2016-11-27 VITALS — BP 128/75 | HR 72 | Resp 18 | Ht 72.0 in | Wt 139.0 lb

## 2016-11-27 DIAGNOSIS — G20 Parkinson's disease: Secondary | ICD-10-CM

## 2016-11-27 DIAGNOSIS — R269 Unspecified abnormalities of gait and mobility: Secondary | ICD-10-CM

## 2016-11-27 MED ORDER — CARBIDOPA-LEVODOPA ER 50-200 MG PO TBCR: EXTENDED_RELEASE_TABLET | ORAL | 11 refills | Status: DC

## 2016-11-27 MED ORDER — ROPINIROLE HCL 5 MG PO TABS: ORAL_TABLET | ORAL | 11 refills | Status: DC

## 2016-11-27 NOTE — Progress Notes (Signed)
Reason for visit: Parkinson's disease  Jorge Lester is an 83 y.o. male  History of present illness:  [name] is an 83 year old right-handed white male with a history of Parkinson's disease. The patient is doing well with his ability to ambulate. He will have an occasional freezing episode, but this is not a significant issue for him. He has recently had a right middle lobe pneumonia, he has had worsening of his COPD, he has significant shortness of breath with physical activity that seems to limit his ability to walk. The patient denies any hallucinations. He had one fall on 09/26/2016 without injury when he was outside doing yard work. The patient is tolerating the medication well, the Requip seems to help his restless leg syndrome in the evening hours. He tries to stay active. He returns to this office for an evaluation.  Past Medical History:  Diagnosis Date  . CAD (coronary artery disease)    a. CTO of LAD known since 2008, s/p DES x 2 to CTO 01/01/13. b. known residual nonobstructive disease in LCx, RCA.  [name]. COPD (chronic obstructive pulmonary disease) (HCC)   . GERD (gastroesophageal reflux disease)   . Gout   . Hyperlipidemia   . Hypertension   . Mitral regurgitation    Prev mild, most recently trivial by echo 11/2012.  [name]. Parkinson's disease   . Sleep apnea     Past Surgical History:  Procedure Laterality Date  . CARDIAC CATHETERIZATION  2008  . CORONARY ANGIOPLASTY WITH STENT PLACEMENT  01/01/2013   "2" (01/01/2013)  . LUMBAR DISC SURGERY  1974  . PERCUTANEOUS CORONARY STENT INTERVENTION (PCI-S) N/A 01/01/2013   Procedure: PERCUTANEOUS CORONARY STENT INTERVENTION (PCI-S);  Surgeon: Peter M SwazilandJordan, MD;  [facility];  Service: Cardiovascular;  Laterality: N/A;  . TONSILLECTOMY AND ADENOIDECTOMY  1947  . TRIGGER FINGER RELEASE  1980's  . VASECTOMY      Family History  Problem Relation Age of Onset  . Ovarian cancer Mother   . Coronary artery disease Father    . Alzheimer's disease Father   . Diabetes Brother   . Diabetes Other   . Transient ischemic attack Other     Social history:  reports that he quit smoking about 27 years ago. His smoking use included Cigarettes. He started smoking about 71 years ago. He has a 88.00 pack-year smoking history. He has never used smokeless tobacco. He reports that he does not drink alcohol or use drugs.    Allergies  Allergen Reactions  . Amantadines     Hallucinations  . Sulfonamide Derivatives     Medications:  Prior to Admission medications   Medication Sig Start Date End Date Taking? Authorizing Provider  aspirin 81 MG tablet Take 81 mg by mouth daily. Reported on 01/24/2016   Yes [provider]  carbidopa-levodopa (SINEMET CR) 50-200 MG tablet TAKE 1/2 TABLET BY MOUTH IN THE MORNING AND AT NOON, THEN 1 TABLET INTHE EVENING. 05/10/16  Yes York SpanielWillis, Markee Matera K, MD  carbidopa-levodopa (SINEMET IR) 25-100 MG tablet One tablet in the morning and at noon, 1/2 tablet in the evening Patient taking differently: Take 1 tablet by mouth 2 (two) times daily.  06/26/16  Yes York SpanielWillis, Khaden Gater K, MD  doxazosin (CARDURA) 2 MG tablet Take 2 mg by mouth at bedtime.     Yes [provider]  famotidine (PEPCID) 20 MG tablet TAKE (1) TABLET TWICE DAILY. 03/07/15  Yes Branch, Dorothe PeaJonathan F, MD  metoprolol tartrate (LOPRESSOR)  25 MG tablet TAKE (1) TABLET TWICE DAILY. 07/10/16  Yes Branch, Dorothe Pea, MD  nitroGLYCERIN (NITROSTAT) 0.4 MG SL tablet Place 1 tablet (0.4 mg total) under the tongue every 5 (five) minutes as needed. 11/13/12  Yes Serpe, Clide Deutscher, PA-C  QUEtiapine (SEROQUEL) 25 MG tablet TAKE (1) TABLET DAILY AT BEDTIME. 07/11/16  Yes York Spaniel, MD  ropinirole (REQUIP) 5 MG tablet TAKE 1 TABLET BY MOUTH BEFORE BEDTIME 06/26/16  Yes York Spaniel, MD  simvastatin (ZOCOR) 40 MG tablet Take 1 tablet by mouth daily. Changed by Dr. Margo Common 11/28/15  Yes [provider]  tiotropium (SPIRIVA  HANDIHALER) 18 MCG inhalation capsule Place 1 capsule (18 mcg total) into inhaler and inhale every 6 (six) hours as needed. 08/28/16  Yes BranchDorothe Pea, MD  traZODone (DESYREL) 50 MG tablet Take 1 tablet by mouth daily. 10/20/12  Yes [provider]  triamcinolone ointment (KENALOG) 0.1 % Apply 0.1 g topically daily as needed. 11/24/13  Yes [provider]    ROS:  Out of a complete 14 system review of symptoms, the patient complains only of the following symptoms, and all other reviewed systems are negative.  Decreased appetite, fatigue, weight loss Cough, shortness of breath  Blood pressure 128/75, pulse 72, resp. rate 18, height 6' (1.829 m), weight 139 lb (63 kg).  Physical Exam  General: The patient is alert and cooperative at the time of the examination.  Skin: No significant peripheral edema is noted.   Neurologic Exam  Mental status: The patient is alert and oriented x 3 at the time of the examination. The patient has apparent normal recent and remote memory, with an apparently normal attention span and concentration ability.   Cranial nerves: Facial symmetry is present. Speech is normal, no aphasia or dysarthria is noted. Extraocular movements are full. Visual fields are full. The patient has a tremor involving the jaw.  Motor: The patient has good strength in all 4 extremities.  Sensory examination: Soft touch sensation is symmetric on the face, arms, and legs.  Coordination: The patient has good finger-nose-finger and heel-to-shin bilaterally. Resting tremors are noted with both feet.  Gait and station: The patient has the ability to stand from a seated position with arms crossed. Once up, the patient takes good stride, there is some decreased arm swing on the left relative to the right. The patient has slight instability with turns. Tandem gait is minimally unsteady. Romberg is negative. No drift is seen.  Reflexes: Deep tendon reflexes are  symmetric.   Assessment/Plan:  1. Parkinson's disease  2. Gait instability  3. Restless leg syndrome  The patient is doing quite well with his Parkinson symptoms currently, he is mainly bothered by his COPD symptoms and shortness of breath. The patient is remaining active. We will not alter medication therapy. He was given a prescription for the Sinemet CR and Requip. He will follow-up in 6 months.   Marlan Palau MD 11/27/2016 12:39 PM  [facility] [address] [place], [place] [address]  Phone [phone] Fax [phone]

## 2017-01-22 ENCOUNTER — Telehealth: Payer: Self-pay | Admitting: Neurology

## 2017-01-22 NOTE — Telephone Encounter (Signed)
I called the daughter. The patient has had documented low pressures in the 80-90 systolic range during periods of symptoms with dizziness. The patient may have orthostatic hypotension.  The patient is to use compression stockings, he is to increase fluid intake with salt, may drink Gatorade.  We will need to follow blood pressures, he may require medications to the blood pressures up in the future.

## 2017-01-22 NOTE — Telephone Encounter (Signed)
Patients daughter Gavin PoundDeborah (listed on DPR) called office in reference to patient having dizziness daily while sitting and getting up going on for the past couple of months.  Gavin PoundDeborah would like to know if there is anything they can do to help with the dizziness.  BP has been running low also. Gavin PoundDeborah is afraid patient is going to fall and hurt himself.  Please call [phone] up until 3:30 and mobile number after.

## 2017-01-29 ENCOUNTER — Ambulatory Visit: Payer: Medicare Other | Admitting: Cardiology

## 2017-02-01 ENCOUNTER — Other Ambulatory Visit: Payer: Self-pay

## 2017-02-01 ENCOUNTER — Other Ambulatory Visit: Payer: Self-pay | Admitting: Cardiology

## 2017-02-01 MED ORDER — METOPROLOL TARTRATE 25 MG PO TABS: ORAL_TABLET | ORAL | 0 refills | Status: DC

## 2017-02-01 MED ORDER — METOPROLOL TARTRATE 25 MG PO TABS: ORAL_TABLET | ORAL | 2 refills | Status: DC

## 2017-02-01 NOTE — Telephone Encounter (Signed)
° ° ° °  1. Which medications need to be refilled? (please list name of each medication and dose if known) metoprolol tartrate (LOPRESSOR) 25 MG tablet [[id number]]  Order Details   2. Which pharmacy/location (including street and city if local pharmacy) is [facility]  3. Do they need a 30 day or 90 day supply?  Patient has upcoming appointment but will run out of medication before the appointment.

## 2017-02-01 NOTE — Telephone Encounter (Signed)
Medication sent to pharmacy - pt has f/u in September

## 2017-02-04 ENCOUNTER — Other Ambulatory Visit: Payer: Self-pay | Admitting: Neurology

## 2017-03-26 ENCOUNTER — Ambulatory Visit (INDEPENDENT_AMBULATORY_CARE_PROVIDER_SITE_OTHER): Payer: Medicare Other | Admitting: Cardiology

## 2017-03-26 ENCOUNTER — Encounter: Payer: Self-pay | Admitting: Cardiology

## 2017-03-26 ENCOUNTER — Encounter: Payer: Self-pay | Admitting: *Deleted

## 2017-03-26 VITALS — BP 96/56 | HR 57 | Ht 72.0 in | Wt 132.0 lb

## 2017-03-26 DIAGNOSIS — I10 Essential (primary) hypertension: Secondary | ICD-10-CM | POA: Diagnosis not present

## 2017-03-26 DIAGNOSIS — I2510 Atherosclerotic heart disease of native coronary artery without angina pectoris: Secondary | ICD-10-CM

## 2017-03-26 DIAGNOSIS — E782 Mixed hyperlipidemia: Secondary | ICD-10-CM

## 2017-03-26 MED ORDER — METOPROLOL TARTRATE 25 MG PO TABS: 12.5000 mg | ORAL_TABLET | Freq: Two times a day (BID) | ORAL | 1 refills | Status: DC

## 2017-03-26 NOTE — Patient Instructions (Signed)
Your physician wants you to follow-up in: 6 MONTHS [facility] You will receive a reminder letter in the mail two months in advance. If you don't receive a letter, please call our office to schedule the follow-up appointment.  Your physician has recommended you make the following change in your medication:   DECREASE LOPRESSOR (METORPOLOL) 12.5 MG TWICE DAILY   Thank you for choosing [facility]!!

## 2017-03-26 NOTE — Progress Notes (Signed)
Clinical Summary Jorge Lester is a 83 y.o.male seen today for follow up of the following medical problems.   1. CAD  - PCI to LAD CTO 12/2012 with DES x2  - 11/2012 echo LVEF 55-60%,  - 12/2015 nuclear stress low risk, possible mild ischemia  Medical therapy limited by soft bp's, orthostatic symptoms.   - no recent chest pain. Isolated episode back pain that radiated to chest  - compliant with meds  2. HTN  - recent issues with low bp's at home, dizziness - symptoms improved with compressions stockings. He reports poor oral hydration   3. HL  - compliant with statin - 11/2015 TG 73 TC 121 HDL 63 LDL 53  4. COPD - abnormal PFTs 08/2016 - followed by pcp  5. Parkinsons disease - followed by neuro Past Medical History:  Diagnosis Date  . CAD (coronary artery disease)    a. CTO of LAD known since 2008, s/p DES x 2 to CTO 01/01/13. b. known residual nonobstructive disease in LCx, RCA.  [name]. COPD (chronic obstructive pulmonary disease) (HCC)   . GERD (gastroesophageal reflux disease)   . Gout   . Hyperlipidemia   . Hypertension   . Mitral regurgitation    Prev mild, most recently trivial by echo 11/2012.  [name]. Parkinson's disease   . Sleep apnea      Allergies  Allergen Reactions  . Amantadines     Hallucinations  . Sulfonamide Derivatives      Current Outpatient Prescriptions  Medication Sig Dispense Refill  . aspirin 81 MG tablet Take 81 mg by mouth daily. Reported on 01/24/2016    . carbidopa-levodopa (SINEMET CR) 50-200 MG tablet TAKE 1/2 TABLET BY MOUTH IN THE MORNING AND AT NOON, THEN 1 TABLET INTHE EVENING. 60 tablet 11  . carbidopa-levodopa (SINEMET IR) 25-100 MG tablet One tablet in the morning and at noon, 1/2 tablet in the evening (Patient taking differently: Take 1 tablet by mouth 2 (two) times daily. ) 90 tablet 11  . doxazosin (CARDURA) 2 MG tablet Take 2 mg by mouth at bedtime.      . famotidine (PEPCID) 20 MG tablet TAKE (1) TABLET TWICE DAILY. 60  tablet 3  . metoprolol tartrate (LOPRESSOR) 25 MG tablet TAKE (1) TABLET TWICE DAILY. 60 tablet 2  . nitroGLYCERIN (NITROSTAT) 0.4 MG SL tablet Place 1 tablet (0.4 mg total) under the tongue every 5 (five) minutes as needed. 25 tablet 3  . QUEtiapine (SEROQUEL) 25 MG tablet TAKE (1) TABLET DAILY AT BEDTIME. 30 tablet 5  . ropinirole (REQUIP) 5 MG tablet TAKE 1 TABLET BY MOUTH BEFORE BEDTIME 30 tablet 11  . simvastatin (ZOCOR) 40 MG tablet Take 1 tablet by mouth daily. Changed by Dr. Margo Lester    . tiotropium (SPIRIVA HANDIHALER) 18 MCG inhalation capsule Place 1 capsule (18 mcg total) into inhaler and inhale every 6 (six) hours as needed. 30 capsule 6  . traZODone (DESYREL) 50 MG tablet Take 1 tablet by mouth daily.    [name]. triamcinolone ointment (KENALOG) 0.1 % Apply 0.1 g topically daily as needed.     No current facility-administered medications for this visit.      Past Surgical History:  Procedure Laterality Date  . CARDIAC CATHETERIZATION  2008  . CORONARY ANGIOPLASTY WITH STENT PLACEMENT  01/01/2013   "2" (01/01/2013)  . LUMBAR DISC SURGERY  1974  . PERCUTANEOUS CORONARY STENT INTERVENTION (PCI-S) N/A 01/01/2013   Procedure: PERCUTANEOUS CORONARY STENT INTERVENTION (PCI-S);  Surgeon: Jorge AristaPeter  M Swaziland, MD;  Location: [facility] CATH LAB;  Service: Cardiovascular;  Laterality: N/A;  . TONSILLECTOMY AND ADENOIDECTOMY  1947  . TRIGGER FINGER RELEASE  1980's  . VASECTOMY       Allergies  Allergen Reactions  . Amantadines     Hallucinations  . Sulfonamide Derivatives       Family History  Problem Relation Age of Onset  . Ovarian cancer Mother   . Coronary artery disease Father   . Alzheimer's disease Father   . Diabetes Brother   . Diabetes Other   . Transient ischemic attack Other      Social History Jorge Lester reports that he quit smoking about 27 years ago. His smoking use included Cigarettes. He started smoking about 71 years ago. He has a 88.00 pack-year smoking history. He has  never used smokeless tobacco. Jorge Lester reports that he does not drink alcohol.   Review of Systems CONSTITUTIONAL: No weight loss, fever, chills, weakness or fatigue.  HEENT: Eyes: No visual loss, blurred vision, double vision or yellow sclerae.No hearing loss, sneezing, congestion, runny nose or sore throat.  SKIN: No rash or itching.  CARDIOVASCULAR: per hpi RESPIRATORY: No shortness of breath, cough or sputum.  GASTROINTESTINAL: No anorexia, nausea, vomiting or diarrhea. No abdominal pain or blood.  GENITOURINARY: No burning on urination, no polyuria NEUROLOGICAL: No headache, dizziness, syncope, paralysis, ataxia, numbness or tingling in the extremities. No change in bowel or bladder control.  MUSCULOSKELETAL: No muscle, back pain, joint pain or stiffness.  LYMPHATICS: No enlarged nodes. No history of splenectomy.  PSYCHIATRIC: No history of depression or anxiety.  ENDOCRINOLOGIC: No reports of sweating, cold or heat intolerance. No polyuria or polydipsia.  [name]   Physical Examination Vitals:   03/26/17 0838  BP: (!) 96/56  Pulse: (!) 57   Vitals:   03/26/17 0838  Weight: 132 lb (59.9 kg)  Height: 6' (1.829 m)    Gen: resting comfortably, no acute distress HEENT: no scleral icterus, pupils equal round and reactive, no palptable cervical adenopathy,  CV: RRR, no m/r/g, no jvd Resp: Clear to auscultation bilaterally GI: abdomen is soft, non-tender, non-distended, normal bowel sounds, no hepatosplenomegaly MSK: extremities are warm, no edema.  Skin: warm, no rash Neuro:  no focal deficits Psych: appropriate affect   Diagnostic Studies  11/2012 Cath Procedural Findings:  Hemodynamics:  AO 117/52 with a mean of 79 mmHg  LV 116/14 mmHg  Coronary angiography:  Coronary dominance: right  Left mainstem: The left main coronary is normal.  Left anterior descending (LAD): The left anterior descending artery is diffusely diseased in the proximal vessel up to 75%. It  is occluded in the mid vessel. There are left to left collaterals from the ramus intermediate [name]. There are also right-to-left collaterals via septal perforators to the distal LAD. The proximal Is well-defined and there appears to be a small tract through the occlusion.  There is a ramus intermediate Martasia Talamante which is moderate to large. It has mild irregularities less than 20%.  Left circumflex (LCx): The left circumflex gives rise to a single bifurcating marginal Terica Yogi. The proximal OM has a segmental 60% stenosis.  Right coronary artery (RCA): The right coronary is a large dominant vessel. It is diffusely diseased throughout with stenosis up to 40% in the mid vessel. There is significant plaque burden throughout without significant stenosis.  Left ventriculography: Left ventricular systolic function is normal, LVEF is estimated at 55-65%, there is no significant mitral regurgitation  Final Conclusions:  1. Single vessel occlusive coronary disease with chronic total occlusion of the mid LAD.  2. Normal left ventricular function.  Recommendations: The patient has significant symptoms and ischemia on noninvasive testing despite 2 antianginal agents. His LAD CTO appears suitable for PCI. The alternative would be to increase his antianginal therapy. I will discuss options with the patient.   01/01/13 Lesion Data:  Vessel: LAD  Percent stenosis (pre): 100%  TIMI-flow (pre): 0  Stent: 2.5 x 38 and 2.75 x 12 mm Promus premier stents  Percent stenosis (post): 0%  TIMI-flow (post): 3  Conclusions:  Successful stenting of the mid LAD for CTO with drug-eluting stents.   11/2012 Echo LVEF 55-60%, grade II diastolic dysfunction,   11/2015 echo Study Conclusions  - Left ventricle: The cavity size was normal. Wall thickness was  normal. Systolic function was normal. The estimated ejection  fraction was in the range of 55% to 60%. Wall motion was normal;  there were no  regional wall motion abnormalities. Doppler  parameters are consistent with abnormal left ventricular  relaxation (grade 1 diastolic dysfunction). - Aortic valve: There was mild regurgitation. Valve area (VTI):  2.49 cm^2. Valve area (Vmax): 2.39 cm^2. Valve area (Vmean): 2.42  cm^2. - Mitral valve: There was mild regurgitation. - Right ventricle: The cavity size was mildly dilated. - Right atrium: The atrium was mildly dilated. - Technically adequate study.  12/2015 MPI Perfusion Summary Defect 1:  There is a small defect of mild severity present in the apical anterior location. The defect is partially reversible. Small, mild intensity, partially reversible apical anterior defect suggesting a minor region of ischemia.  Defect 2:  There is a small defect of mild severity present in the mid inferoseptal and apical inferior location. The defect is non-reversible. Small, mild intensity, mid to apical inferior/inferoseptal defect consistent with soft tissue attenuation.    Overall Study Impression Myocardial perfusion is abnormal. This is a low risk study. Overall left ventricular systolic function was normal. Nuclear stress EF: 66%    08/2016 PFTs +COPD  Assessment and Plan  1. CAD  -patient s/p DES to LAD CTO in June 2014 - recent stress test without significant ischemia, echo with normal LVEF - he denies any recent symptoms. EKG in clinic shows SR, no acute ischemic chages - medical therapy limited by soft bp's. Continue current meds  2. HTN  - more recent issues have been with low bp's and orthostatic dizziness - we will lower lopressor to 12.5mg  bid.   3. Orthostatic dizziness - poor oral hydration. Also history of Parkinsons which could play a role - continue compression stockings, encouraged increased hydration and salt intake. Lower lopressor to 12.5mg  bid  4. Hyperlipidemia  - he changed back to simva  on his own, no side effects on atorva 80 but  uncomfortable taking that dose.  - he will continue current meds  F/u 6 months       Antoine Poche, M.D., F.A.C.C.

## 2017-06-04 ENCOUNTER — Encounter: Payer: Self-pay | Admitting: Neurology

## 2017-06-04 ENCOUNTER — Encounter (INDEPENDENT_AMBULATORY_CARE_PROVIDER_SITE_OTHER): Payer: Self-pay

## 2017-06-04 ENCOUNTER — Ambulatory Visit: Payer: Medicare Other | Admitting: Neurology

## 2017-06-04 VITALS — BP 110/66 | HR 80 | Ht 72.0 in | Wt 129.0 lb

## 2017-06-04 DIAGNOSIS — R441 Visual hallucinations: Secondary | ICD-10-CM

## 2017-06-04 DIAGNOSIS — I951 Orthostatic hypotension: Secondary | ICD-10-CM

## 2017-06-04 DIAGNOSIS — G20 Parkinson's disease: Secondary | ICD-10-CM

## 2017-06-04 DIAGNOSIS — R269 Unspecified abnormalities of gait and mobility: Secondary | ICD-10-CM | POA: Diagnosis not present

## 2017-06-04 HISTORY — DX: Orthostatic hypotension: I95.1

## 2017-06-04 MED ORDER — MIRTAZAPINE 15 MG PO TABS: 15.0000 mg | ORAL_TABLET | Freq: Every day | ORAL | 3 refills | Status: DC

## 2017-06-04 MED ORDER — QUETIAPINE FUMARATE 25 MG PO TABS: ORAL_TABLET | ORAL | 11 refills | Status: DC

## 2017-06-04 NOTE — Progress Notes (Signed)
Reason for visit: Parkinson's disease  Jorge Lester is an 83 y.o. male  History of present illness:  Jorge Lester is an 83 year old right-handed white male with a history of Parkinson's disease.  The patient has had some problems with orthostatic hypotension, he continues to drop out his blood pressure but he is not blacking out, he may feel dizzy on occasion.  He is on Cardura, the dose has been reduced from 4 mg a day to a 2 mg a day dosing.  This has helped with the hypotension.  The patient remains on Sinemet and Requip, he tolerates these medications fairly well.  The patient does not use a cane for ambulation, he has been relatively active doing yard work, he has not sustained any falls.  The patient denies any issues with swallowing or choking.  He continues to have intermittent hallucinations but he has full awareness of these events and it does not bother him.  He has done better with his overall demeanor taking Seroquel.  He returns to the office today for an evaluation.  The patient has had a reduction in appetite, he has lost 10 pounds since last seen.  He does not feel like eating much.  He denies any overt nausea or vomiting.  Past Medical History:  Diagnosis Date  . CAD (coronary artery disease)    a. CTO of LAD known since 2008, s/p DES x 2 to CTO 01/01/13. b. known residual nonobstructive disease in LCx, RCA.  [name]. COPD (chronic obstructive pulmonary disease) (HCC)   . GERD (gastroesophageal reflux disease)   . Gout   . Hyperlipidemia   . Hypertension   . Mitral regurgitation    Prev mild, most recently trivial by echo 11/2012.  [name]. Parkinson's disease   . Sleep apnea     Past Surgical History:  Procedure Laterality Date  . CARDIAC CATHETERIZATION  2008  . CORONARY ANGIOPLASTY WITH STENT PLACEMENT  01/01/2013   "2" (01/01/2013)  . LUMBAR DISC SURGERY  1974  . PERCUTANEOUS CORONARY STENT INTERVENTION (PCI-S) N/A 01/01/2013   Procedure: PERCUTANEOUS CORONARY STENT  INTERVENTION (PCI-S);  Surgeon: Peter M SwazilandJordan, MD;  [facility];  Service: Cardiovascular;  Laterality: N/A;  . TONSILLECTOMY AND ADENOIDECTOMY  1947  . TRIGGER FINGER RELEASE  1980's  . VASECTOMY      Family History  Problem Relation Age of Onset  . Ovarian cancer Mother   . Coronary artery disease Father   . Alzheimer's disease Father   . Diabetes Brother   . Diabetes Other   . Transient ischemic attack Other     Social history:  reports that he quit smoking about 27 years ago. His smoking use included cigarettes. He started smoking about 71 years ago. He has a 88.00 pack-year smoking history. he has never used smokeless tobacco. He reports that he does not drink alcohol or use drugs.    Allergies  Allergen Reactions  . Amantadines     Hallucinations  . Sulfonamide Derivatives     Medications:  Prior to Admission medications   Medication Sig Start Date End Date Taking? Authorizing Provider  aspirin 81 MG tablet Take 81 mg by mouth daily. Reported on 01/24/2016   Yes [provider]  carbidopa-levodopa (SINEMET CR) 50-200 MG tablet TAKE 1/2 TABLET BY MOUTH IN THE MORNING AND AT NOON, THEN 1 TABLET INTHE EVENING. 11/27/16  Yes York SpanielWillis, Reshanda Lewey K, MD  carbidopa-levodopa (SINEMET IR) 25-100 MG tablet One tablet in the morning and  at noon, 1/2 tablet in the evening Patient taking differently: Take 1 tablet by mouth 2 (two) times daily.  06/26/16  Yes York SpanielWillis, Talmage Teaster K, MD  doxazosin (CARDURA) 2 MG tablet Take 2 mg by mouth at bedtime.     Yes [provider]  famotidine (PEPCID) 20 MG tablet TAKE (1) TABLET TWICE DAILY. 03/07/15  Yes BranchDorothe Pea, Jonathan F, MD  metoprolol tartrate (LOPRESSOR) 25 MG tablet Take 0.5 tablets (12.5 mg total) by mouth 2 (two) times daily. 03/26/17 06/24/17 Yes Branch, Dorothe PeaJonathan F, MD  nitroGLYCERIN (NITROSTAT) 0.4 MG SL tablet Place 1 tablet (0.4 mg total) under the tongue every 5 (five) minutes as needed. 11/13/12  Yes Serpe, Clide DeutscherEugene C,  PA-C  QUEtiapine (SEROQUEL) 25 MG tablet TAKE (1) TABLET DAILY AT BEDTIME. 02/04/17  Yes York SpanielWillis, Shaketta Rill K, MD  ropinirole (REQUIP) 5 MG tablet TAKE 1 TABLET BY MOUTH BEFORE BEDTIME 11/27/16  Yes York SpanielWillis, Jnae Thomaston K, MD  simvastatin (ZOCOR) 40 MG tablet Take 1 tablet by mouth daily. Changed by Dr. Margo Commonapper 11/28/15  Yes [provider]  traZODone (DESYREL) 50 MG tablet Take 1 tablet by mouth daily. 10/20/12  Yes [provider]    ROS:  Out of a complete 14 system review of symptoms, the patient complains only of the following symptoms, and all other reviewed systems are negative.  Dizziness Hallucinations  Blood pressure 110/66, pulse 80, height 6' (1.829 m), weight 129 lb (58.5 kg).   Blood pressure, right arm, sitting is 106/54.  Blood pressure, right arm, standing is 84 systolic.  Physical Exam  General: The patient is alert and cooperative at the time of the examination.  Skin: No significant peripheral edema is noted.   Neurologic Exam  Mental status: The patient is alert and oriented x 3 at the time of the examination. The patient has apparent normal recent and remote memory, with an apparently normal attention span and concentration ability.   Cranial nerves: Facial symmetry is present. Speech is normal, no aphasia or dysarthria is noted. Extraocular movements are full. Visual fields are full.  Masking of the face is seen.  A jaw tremor is noted.  Motor: The patient has good strength in all 4 extremities.  Sensory examination: Soft touch sensation is symmetric on the face, arms, and legs.  Coordination: The patient has good finger-nose-finger and heel-to-shin bilaterally.  Gait and station: The patient is able to ambulate without assistance, the patient appears to have symmetric arm swing, tremors are noted with both upper extremities with walking.  Tandem gait is slightly unsteady. Romberg is negative. No drift is seen.  Reflexes: Deep tendon reflexes are  symmetric.   Assessment/Plan:  1.  Parkinson's disease  2.  Orthostatic hypotension  3.  Appetite reduction, weight loss  The patient will be taken off of trazodone at night and placed on mirtazapine to see if this helps both sleeping and boosting appetite.  He will call for any dose adjustments.  He will contact his physician concerning the use of Cardura, if this can be discontinued completely it may help improve his blood pressure further.  He will remain on the Requip and Sinemet dosing as above.  The patient will follow-up in 5 months.  Marlan Palau. Keith Michaelangelo Mittelman MD 06/04/2017 12:18 PM  [facility] [address] [place], [place] [id number]  Phone [phone] Fax [phone]

## 2017-06-04 NOTE — Patient Instructions (Addendum)
   We will stop the trazodone at night and start Remeron 15 mg tablet at night for sleep and to boost appetite.  Call for any dose adjustments.

## 2017-06-05 ENCOUNTER — Ambulatory Visit: Payer: Medicare Other | Admitting: Neurology

## 2017-07-12 ENCOUNTER — Telehealth: Payer: Self-pay | Admitting: Neurology

## 2017-07-12 NOTE — Telephone Encounter (Signed)
Pt called to let Dr Anne HahnWillis know that he is  gaining weight back,  walking and BP is doing better. Regarding his new medication(he couldn't remember which one)    FYI

## 2017-07-12 NOTE — Telephone Encounter (Signed)
The patient was switched to mirtazapine, he seems to be doing better with this.

## 2017-07-17 ENCOUNTER — Other Ambulatory Visit: Payer: Self-pay | Admitting: Neurology

## 2017-07-31 ENCOUNTER — Other Ambulatory Visit: Payer: Self-pay | Admitting: Neurology

## 2017-09-23 ENCOUNTER — Encounter: Payer: Self-pay | Admitting: Cardiology

## 2017-09-23 ENCOUNTER — Ambulatory Visit: Payer: Medicare Other | Admitting: Cardiology

## 2017-09-23 VITALS — BP 116/58 | HR 70 | Ht 72.0 in | Wt 146.0 lb

## 2017-09-23 DIAGNOSIS — I10 Essential (primary) hypertension: Secondary | ICD-10-CM | POA: Diagnosis not present

## 2017-09-23 DIAGNOSIS — I2510 Atherosclerotic heart disease of native coronary artery without angina pectoris: Secondary | ICD-10-CM

## 2017-09-23 DIAGNOSIS — E782 Mixed hyperlipidemia: Secondary | ICD-10-CM

## 2017-09-23 NOTE — Patient Instructions (Signed)
Your physician wants you to follow-up in:6 months  with Dr.Branch You will receive a reminder letter in the mail two months in advance. If you don't receive a letter, please call our office to schedule the follow-up appointment.   Your physician recommends that you continue on your current medications as directed. Please refer to the Current Medication list given to you today.    If you need a refill on your cardiac medications before your next appointment, please call your pharmacy.      No lab work or tests ordered today.       Thank you for choosing [facility] !         

## 2017-09-23 NOTE — Progress Notes (Signed)
Clinical Summary Mr. Yawn is a 84 y.o.male seen today for follow up of the following medical problems.   1. CAD  - PCI to LAD CTO 12/2012 with DES x2  - 11/2012 echo LVEF 55-60%,  - 12/2015 nuclear stress low risk, possible mild ischemia  Medical therapy limited by soft bp's, orthostatic symptoms.    - denies any chest pain, no SOB/DOE - compliant with meds.   2. HTN  - prior issues with low bp's at home, dizziness - no recent symptoms, prior orthostatic dizziness improved with decreased bp meds.   3. HL  - compliant with statin  - upcoming labs with pcp  4. COPD - abnormal PFTs 08/2016 - followed by pcp  5. Parkinsons disease - followed by neuro   Past Medical History:  Diagnosis Date  . CAD (coronary artery disease)    a. CTO of LAD known since 2008, s/p DES x 2 to CTO 01/01/13. b. known residual nonobstructive disease in LCx, RCA.  [name] COPD (chronic obstructive pulmonary disease) (HCC)   . GERD (gastroesophageal reflux disease)   . Gout   . Hyperlipidemia   . Hypertension   . Mitral regurgitation    Prev mild, most recently trivial by echo 11/2012.  . Orthostatic hypotension 06/04/2017  . Parkinson's disease   . Sleep apnea      Allergies  Allergen Reactions  . Amantadines     Hallucinations  . Sulfonamide Derivatives      Current Outpatient Medications  Medication Sig Dispense Refill  . aspirin 81 MG tablet Take 81 mg by mouth daily. Reported on 01/24/2016    . carbidopa-levodopa (SINEMET CR) 50-200 MG tablet TAKE 1/2 TABLET BY MOUTH IN THE MORNING AND AT NOON, THEN 1 TABLET INTHE EVENING. 60 tablet 11  . carbidopa-levodopa (SINEMET IR) 25-100 MG tablet TAKE 1 TABLET IN THE MORNING AND AT NOON, AND TAKE 1/2 TABLET IN THE EVENING 75 tablet 5  . doxazosin (CARDURA) 2 MG tablet Take 2 mg by mouth at bedtime.      . famotidine (PEPCID) 20 MG tablet TAKE (1) TABLET TWICE DAILY. 60 tablet 3  . metoprolol tartrate (LOPRESSOR) 25 MG tablet Take  0.5 tablets (12.5 mg total) by mouth 2 (two) times daily. 90 tablet 1  . mirtazapine (REMERON) 15 MG tablet TAKE 1 TABLET BY MOUTH AT BEDTIME. 30 tablet 3  . nitroGLYCERIN (NITROSTAT) 0.4 MG SL tablet Place 1 tablet (0.4 mg total) under the tongue every 5 (five) minutes as needed. 25 tablet 3  . QUEtiapine (SEROQUEL) 25 MG tablet TAKE (1) TABLET DAILY AT BEDTIME. 30 tablet 11  . ropinirole (REQUIP) 5 MG tablet TAKE 1 TABLET BY MOUTH BEFORE BEDTIME 30 tablet 11  . simvastatin (ZOCOR) 40 MG tablet Take 1 tablet by mouth daily. Changed by Dr. Margo Common     No current facility-administered medications for this visit.      Past Surgical History:  Procedure Laterality Date  . CARDIAC CATHETERIZATION  2008  . CORONARY ANGIOPLASTY WITH STENT PLACEMENT  01/01/2013   "2" (01/01/2013)  . LUMBAR DISC SURGERY  1974  . PERCUTANEOUS CORONARY STENT INTERVENTION (PCI-S) N/A 01/01/2013   Procedure: PERCUTANEOUS CORONARY STENT INTERVENTION (PCI-S);  Surgeon: Peter M Swaziland, MD;  Location: [facility] CATH LAB;  Service: Cardiovascular;  Laterality: N/A;  . TONSILLECTOMY AND ADENOIDECTOMY  1947  . TRIGGER FINGER RELEASE  1980's  . VASECTOMY       Allergies  Allergen Reactions  . Amantadines  Hallucinations  . Sulfonamide Derivatives       Family History  Problem Relation Age of Onset  . Ovarian cancer Mother   . Coronary artery disease Father   . Alzheimer's disease Father   . Diabetes Brother   . Diabetes Other   . Transient ischemic attack Other      Social History Mr. Sanderfer reports that he quit smoking about 28 years ago. His smoking use included cigarettes. He started smoking about 72 years ago. He has a 88.00 pack-year smoking history. he has never used smokeless tobacco. [name] reports that he does not drink alcohol.   Review of Systems CONSTITUTIONAL: No weight loss, fever, chills, weakness or fatigue.  HEENT: Eyes: No visual loss, blurred vision, double vision or yellow sclerae.No  hearing loss, sneezing, congestion, runny nose or sore throat.  SKIN: No rash or itching.  CARDIOVASCULAR: per hpi RESPIRATORY: No shortness of breath, cough or sputum.  GASTROINTESTINAL: No anorexia, nausea, vomiting or diarrhea. No abdominal pain or blood.  GENITOURINARY: No burning on urination, no polyuria NEUROLOGICAL: No headache, dizziness, syncope, paralysis, ataxia, numbness or tingling in the extremities. No change in bowel or bladder control.  MUSCULOSKELETAL: No muscle, back pain, joint pain or stiffness.  LYMPHATICS: No enlarged nodes. No history of splenectomy.  PSYCHIATRIC: No history of depression or anxiety.  ENDOCRINOLOGIC: No reports of sweating, cold or heat intolerance. No polyuria or polydipsia.  [name].   Physical Examination Vitals:   09/23/17 1308  BP: (!) 116/58  Pulse: 70  SpO2: 99%   Vitals:   09/23/17 1308  Weight: 146 lb (66.2 kg)  Height: 6' (1.829 m)    Gen: resting comfortably, no acute distress HEENT: no scleral icterus, pupils equal round and reactive, no palptable cervical adenopathy,  CV: RRR, no m/r/g, no jvd Resp: Clear to auscultation bilaterally GI: abdomen is soft, non-tender, non-distended, normal bowel sounds, no hepatosplenomegaly MSK: extremities are warm, no edema.  Skin: warm, no rash Neuro:  no focal deficits Psych: appropriate affect   Diagnostic Studies 11/2012 [facility] Procedural Findings:  Hemodynamics:  AO 117/52 with a mean of 79 mmHg  LV 116/14 mmHg  Coronary angiography:  Coronary dominance: right  Left mainstem: The left main coronary is normal.  Left anterior descending (LAD): The left anterior descending artery is diffusely diseased in the proximal vessel up to 75%. It is occluded in the mid vessel. There are left to left collaterals from the ramus intermediate Baylea Milburn. There are also right-to-left collaterals via septal perforators to the distal LAD. The proximal Is well-defined and there appears to be a small  tract through the occlusion.  There is a ramus intermediate [facility] which is moderate to large. It has mild irregularities less than 20%.  Left circumflex (LCx): The left circumflex gives rise to a single bifurcating marginal Tamakia Porto. The proximal OM has a segmental 60% stenosis.  Right coronary artery (RCA): The right coronary is a large dominant vessel. It is diffusely diseased throughout with stenosis up to 40% in the mid vessel. There is significant plaque burden throughout without significant stenosis.  Left ventriculography: Left ventricular systolic function is normal, LVEF is estimated at 55-65%, there is no significant mitral regurgitation  Final Conclusions:  1. Single vessel occlusive coronary disease with chronic total occlusion of the mid LAD.  2. Normal left ventricular function.  Recommendations: The patient has significant symptoms and ischemia on noninvasive testing despite 2 antianginal agents. His LAD CTO appears suitable for PCI. The alternative would be  to increase his antianginal therapy. I will discuss options with the patient.   01/01/13 Lesion Data:  Vessel: LAD  Percent stenosis (pre): 100%  TIMI-flow (pre): 0  Stent: 2.5 x 38 and 2.75 x 12 mm Promus premier stents  Percent stenosis (post): 0%  TIMI-flow (post): 3  Conclusions:  Successful stenting of the mid LAD for CTO with drug-eluting stents.   11/2012 Echo LVEF 55-60%, grade II diastolic dysfunction,   11/2015 echo Study Conclusions  - Left ventricle: The cavity size was normal. Wall thickness was  normal. Systolic function was normal. The estimated ejection  fraction was in the range of 55% to 60%. Wall motion was normal;  there were no regional wall motion abnormalities. Doppler  parameters are consistent with abnormal left ventricular  relaxation (grade 1 diastolic dysfunction). - Aortic valve: There was mild regurgitation. Valve area (VTI):  2.49 cm^2. Valve area (Vmax):  2.39 cm^2. Valve area (Vmean): 2.42  cm^2. - Mitral valve: There was mild regurgitation. - Right ventricle: The cavity size was mildly dilated. - Right atrium: The atrium was mildly dilated. - Technically adequate study.  12/2015 MPI Perfusion Summary Defect 1:  There is a small defect of mild severity present in the apical anterior location. The defect is partially reversible. Small, mild intensity, partially reversible apical anterior defect suggesting a minor region of ischemia.  Defect 2:  There is a small defect of mild severity present in the mid inferoseptal and apical inferior location. The defect is non-reversible. Small, mild intensity, mid to apical inferior/inferoseptal defect consistent with soft tissue attenuation.    Overall Study Impression Myocardial perfusion is abnormal. This is a low risk study. Overall left ventricular systolic function was normal. Nuclear stress EF: 66%    08/2016 PFTs +COPD      Assessment and Plan  1. CAD  -patient s/p DES to LAD CTO in June 2014 - stress test without significant ischemia, echo with normal LVEF - no symptoms, continue current meds  2. HTN  -at goal, continue current meds   3. Hyperlipidemia  - he changed back to simva 40mg  on his own, no side effects on atorva 80 but uncomfortable taking that dose.  - continue current meds, request labs from pcp     F/u 6 months   Antoine Poche, M.D

## 2017-09-25 ENCOUNTER — Encounter: Payer: Self-pay | Admitting: Cardiology

## 2017-10-08 ENCOUNTER — Other Ambulatory Visit: Payer: Self-pay | Admitting: Cardiology

## 2017-11-21 ENCOUNTER — Encounter: Payer: Self-pay | Admitting: Neurology

## 2017-11-21 ENCOUNTER — Ambulatory Visit: Payer: Medicare Other | Admitting: Neurology

## 2017-11-21 VITALS — BP 131/75 | HR 59 | Ht 72.0 in | Wt 145.5 lb

## 2017-11-21 DIAGNOSIS — G20 Parkinson's disease: Secondary | ICD-10-CM

## 2017-11-21 DIAGNOSIS — I951 Orthostatic hypotension: Secondary | ICD-10-CM

## 2017-11-21 NOTE — Progress Notes (Signed)
Reason for visit: Parkinson's disease  Jorge Lester is an 84 y.o. male  History of present illness:  Jorge Lester is an 84 year old right-handed white male with a history of Parkinson's disease.  The patient remains on Sinemet 25/100 mg tablets, he takes 1 tablet in the morning and at noon and half a tablet in the evening.  He takes Sinemet CR, 50/200 mg tablet, he takes 1/2 tablet in the morning and at noon and 1 tablet in the evening.  He takes Seroquel 25 mg at night and he takes Remeron 15 mg at night for sleep and to boost appetite.  The patient takes Requip 5 mg at night.  He has done quite well with this regimen, he is able to do yard work and do housework.  He lives alone, his wife passed away.  The patient has been able to gain weight on the Remeron, he has gained 15 pounds since last seen.  He denies any further episodes of near syncope or syncope.  He is not having any dizziness with standing.  He reports no troubles with swallowing.  Hallucinations have no longer been a problem for him.  He returns to this office for an evaluation.  Past Medical History:  Diagnosis Date  . CAD (coronary artery disease)    a. CTO of LAD known since 2008, s/p DES x 2 to CTO 01/01/13. b. known residual nonobstructive disease in LCx, RCA.  [name] COPD (chronic obstructive pulmonary disease) (HCC)   . GERD (gastroesophageal reflux disease)   . Gout   . Hyperlipidemia   . Hypertension   . Mitral regurgitation    Prev mild, most recently trivial by echo 11/2012.  . Orthostatic hypotension 06/04/2017  . Parkinson's disease   . Sleep apnea     Past Surgical History:  Procedure Laterality Date  . CARDIAC CATHETERIZATION  2008  . CORONARY ANGIOPLASTY WITH STENT PLACEMENT  01/01/2013   "2" (01/01/2013)  . LUMBAR DISC SURGERY  1974  . PERCUTANEOUS CORONARY STENT INTERVENTION (PCI-S) N/A 01/01/2013   Procedure: PERCUTANEOUS CORONARY STENT INTERVENTION (PCI-S);  Surgeon: Peter M Swaziland, MD;  Location: [facility]  CATH LAB;  Service: Cardiovascular;  Laterality: N/A;  . TONSILLECTOMY AND ADENOIDECTOMY  1947  . TRIGGER FINGER RELEASE  1980's  . VASECTOMY      Family History  Problem Relation Age of Onset  . Ovarian cancer Mother   . Coronary artery disease Father   . Alzheimer's disease Father   . Diabetes Brother   . Diabetes Other   . Transient ischemic attack Other     Social history:  reports that he quit smoking about 28 years ago. His smoking use included cigarettes. He started smoking about 72 years ago. He has a 88.00 pack-year smoking history. He has never used smokeless tobacco. He reports that he does not drink alcohol or use drugs.    Allergies  Allergen Reactions  . Amantadines     Hallucinations  . Sulfonamide Derivatives     Medications:  Prior to Admission medications   Medication Sig Start Date End Date Taking? Authorizing Provider  aspirin 81 MG tablet Take 81 mg by mouth daily. Reported on 01/24/2016   Yes [provider]  carbidopa-levodopa (SINEMET CR) 50-200 MG tablet TAKE 1/2 TABLET BY MOUTH IN THE MORNING AND AT NOON, THEN 1 TABLET INTHE EVENING. 08/01/17  Yes York Spaniel, MD  carbidopa-levodopa (SINEMET IR) 25-100 MG tablet TAKE 1 TABLET IN THE MORNING AND AT [place],  AND TAKE 1/2 TABLET IN THE EVENING 07/18/17  Yes York Spaniel, MD  doxazosin (CARDURA) 2 MG tablet Take 2 mg by mouth at bedtime.     Yes [provider]  famotidine (PEPCID) 20 MG tablet TAKE (1) TABLET TWICE DAILY. 03/07/15  Yes Antoine Poche, MD  metoprolol tartrate (LOPRESSOR) 25 MG tablet TAKE 1/2 TABLET BY MOUTH TWICE DAILY 10/09/17  Yes Branch, Dorothe Pea, MD  mirtazapine (REMERON) 15 MG tablet TAKE 1 TABLET BY MOUTH AT BEDTIME. 08/01/17  Yes York Spaniel, MD  nitroGLYCERIN (NITROSTAT) 0.4 MG SL tablet Place 1 tablet (0.4 mg total) under the tongue every 5 (five) minutes as needed. 11/13/12  Yes Serpe, Clide Deutscher, PA-C  QUEtiapine (SEROQUEL) 25 MG tablet TAKE (1) TABLET  DAILY AT BEDTIME. 06/04/17  Yes York Spaniel, MD  ropinirole (REQUIP) 5 MG tablet TAKE 1 TABLET BY MOUTH BEFORE BEDTIME 11/27/16  Yes York Spaniel, MD  simvastatin (ZOCOR) 40 MG tablet Take 1 tablet by mouth daily. Changed by Dr. Margo Common 11/28/15  Yes [provider]    ROS:  Out of a complete 14 system review of symptoms, the patient complains only of the following symptoms, and all other reviewed systems are negative.  Walking problems  Blood pressure 131/75, pulse (!) 59, height 6' (1.829 m), weight 145 lb 8 oz (66 kg).   Blood pressure, systolic, right arm is 104 standing.  Physical Exam  General: The patient is alert and cooperative at the time of the examination.  Skin: No significant peripheral edema is noted.   Neurologic Exam  Mental status: The patient is alert and oriented x 3 at the time of the examination. The patient has apparent normal recent and remote memory, with an apparently normal attention span and concentration ability.   Cranial nerves: Facial symmetry is present. Speech is normal, no aphasia or dysarthria is noted. Extraocular movements are full. Visual fields are full.  A mild jaw tremor seen.  Motor: The patient has good strength in all 4 extremities.  Sensory examination: Soft touch sensation is symmetric on the face, arms, and legs.  Coordination: The patient has good finger-nose-finger and heel-to-shin bilaterally.  Gait and station: The patient has the ability to arise from seated position with the arms crossed.  Once up, the patient is able to ambulate fairly well with good stride and good turns, he has bilateral arm swing.  Tandem gait is unsteady.  Romberg is negative but is unsteady.  Reflexes: Deep tendon reflexes are symmetric.   Assessment/Plan:  1.  Parkinson's disease   2.  Orthostatic hypotension  The patient is doing fairly well at this time.  He will remain on his current dose of Sinemet and Requip.  The Remeron  has allowed him to gain weight and to sleep well at night.  He will remain on this and the Seroquel.  He will follow-up in 5 months.  His orthostatic hypotension has improved.  Marlan Palau MD 11/21/2017 12:16 PM  [facility] [address] [place], [place] [address]  Phone [phone] Fax [phone]

## 2017-12-05 ENCOUNTER — Other Ambulatory Visit: Payer: Self-pay | Admitting: *Deleted

## 2017-12-05 MED ORDER — ROPINIROLE HCL 5 MG PO TABS: ORAL_TABLET | ORAL | 11 refills | Status: DC

## 2018-01-07 ENCOUNTER — Other Ambulatory Visit: Payer: Self-pay

## 2018-01-07 MED ORDER — MIRTAZAPINE 15 MG PO TABS: 15.0000 mg | ORAL_TABLET | Freq: Every day | ORAL | 1 refills | Status: DC

## 2018-01-07 MED ORDER — CARBIDOPA-LEVODOPA 25-100 MG PO TABS: ORAL_TABLET | ORAL | 1 refills | Status: DC

## 2018-01-07 NOTE — Telephone Encounter (Signed)
Refills sent to pharmacy. 

## 2018-04-07 ENCOUNTER — Ambulatory Visit: Payer: Medicare Other | Admitting: Cardiology

## 2018-04-07 ENCOUNTER — Encounter: Payer: Self-pay | Admitting: Cardiology

## 2018-04-07 VITALS — BP 110/64 | HR 60 | Ht 72.0 in | Wt 149.6 lb

## 2018-04-07 DIAGNOSIS — I951 Orthostatic hypotension: Secondary | ICD-10-CM

## 2018-04-07 DIAGNOSIS — E782 Mixed hyperlipidemia: Secondary | ICD-10-CM

## 2018-04-07 DIAGNOSIS — I2510 Atherosclerotic heart disease of native coronary artery without angina pectoris: Secondary | ICD-10-CM | POA: Diagnosis not present

## 2018-04-07 NOTE — Patient Instructions (Signed)

## 2018-04-07 NOTE — Progress Notes (Signed)
Clinical Summary Jorge Lester is a 84 y.o.male  seen today for follow up of the following medical problems.   1. CAD  - PCI to LAD CTO 12/2012 with DES x2  - 11/2012 echo LVEF 55-60%,  - 12/2015 nuclear stress low risk, possible mild ischemia  Medical therapy limited by soft bp's, orthostatic symptoms.    - no recent chest pain. Denies any SOB/DOE - compliant with meds   2.Orhthostatic HTN  - due to his parkinsons disease - rare orthostatic symptoms. Still tolerating lopressor.   3. HL  - compliant with statin. He greatly prefers simvastatin 40mg  daily, has not wanted alternative statin.  - 11/2017 TC 134 TG 84 HDL 47 LDL 70  4. COPD - abnormal PFTs 08/2016 -followed by pcp  5. Parkinsons disease - followed by neuro   Past Medical History:  Diagnosis Date  . CAD (coronary artery disease)    a. CTO of LAD known since 2008, s/p DES x 2 to CTO 01/01/13. b. known residual nonobstructive disease in LCx, RCA.  [name] COPD (chronic obstructive pulmonary disease) (HCC)   . GERD (gastroesophageal reflux disease)   . Gout   . Hyperlipidemia   . Hypertension   . Mitral regurgitation    Prev mild, most recently trivial by echo 11/2012.  . Orthostatic hypotension 06/04/2017  . Parkinson's disease   . Sleep apnea      Allergies  Allergen Reactions  . Amantadines     Hallucinations  . Sulfonamide Derivatives      Current Outpatient Medications  Medication Sig Dispense Refill  . aspirin 81 MG tablet Take 81 mg by mouth daily. Reported on 01/24/2016    . carbidopa-levodopa (SINEMET CR) 50-200 MG tablet TAKE 1/2 TABLET BY MOUTH IN THE MORNING AND AT NOON, THEN 1 TABLET INTHE EVENING. 60 tablet 11  . carbidopa-levodopa (SINEMET IR) 25-100 MG tablet TAKE 1 TABLET IN THE MORNING AND AT NOON, AND TAKE 1/2 TABLET IN THE EVENING 225 tablet 1  . doxazosin (CARDURA) 2 MG tablet Take 2 mg by mouth at bedtime.      . famotidine (PEPCID) 20 MG tablet TAKE (1) TABLET TWICE  DAILY. 60 tablet 3  . metoprolol tartrate (LOPRESSOR) 25 MG tablet TAKE 1/2 TABLET BY MOUTH TWICE DAILY 90 tablet 3  . mirtazapine (REMERON) 15 MG tablet Take 1 tablet (15 mg total) by mouth at bedtime. 90 tablet 1  . nitroGLYCERIN (NITROSTAT) 0.4 MG SL tablet Place 1 tablet (0.4 mg total) under the tongue every 5 (five) minutes as needed. 25 tablet 3  . QUEtiapine (SEROQUEL) 25 MG tablet TAKE (1) TABLET DAILY AT BEDTIME. 30 tablet 11  . ropinirole (REQUIP) 5 MG tablet TAKE 1 TABLET BY MOUTH BEFORE BEDTIME 30 tablet 11  . simvastatin (ZOCOR) 40 MG tablet Take 1 tablet by mouth daily. Changed by Dr. Margo Common     No current facility-administered medications for this visit.      Past Surgical History:  Procedure Laterality Date  . CARDIAC CATHETERIZATION  2008  . CORONARY ANGIOPLASTY WITH STENT PLACEMENT  01/01/2013   "2" (01/01/2013)  . LUMBAR DISC SURGERY  1974  . PERCUTANEOUS CORONARY STENT INTERVENTION (PCI-S) N/A 01/01/2013   Procedure: PERCUTANEOUS CORONARY STENT INTERVENTION (PCI-S);  Surgeon: Peter M Swaziland, MD;  Location: [facility] CATH LAB;  Service: Cardiovascular;  Laterality: N/A;  . TONSILLECTOMY AND ADENOIDECTOMY  1947  . TRIGGER FINGER RELEASE  1980's  . VASECTOMY       Allergies  Allergen Reactions  . Amantadines     Hallucinations  . Sulfonamide Derivatives       Family History  Problem Relation Age of Onset  . Ovarian cancer Mother   . Coronary artery disease Father   . Alzheimer's disease Father   . Diabetes Brother   . Diabetes Other   . Transient ischemic attack Other      Social History Jorge Lester reports that he quit smoking about 28 years ago. His smoking use included cigarettes. He started smoking about 72 years ago. He has a 88.00 pack-year smoking history. He has never used smokeless tobacco. Jorge Lester reports that he does not drink alcohol.   Review of Systems CONSTITUTIONAL: No weight loss, fever, chills, weakness or fatigue.  HEENT: Eyes: No  visual loss, blurred vision, double vision or yellow sclerae.No hearing loss, sneezing, congestion, runny nose or sore throat.  SKIN: No rash or itching.  CARDIOVASCULAR: per hpi RESPIRATORY: No shortness of breath, cough or sputum.  GASTROINTESTINAL: No anorexia, nausea, vomiting or diarrhea. No abdominal pain or blood.  GENITOURINARY: No burning on urination, no polyuria NEUROLOGICAL: No headache, dizziness, syncope, paralysis, ataxia, numbness or tingling in the extremities. No change in bowel or bladder control.  MUSCULOSKELETAL: No muscle, back pain, joint pain or stiffness.  LYMPHATICS: No enlarged nodes. No history of splenectomy.  PSYCHIATRIC: No history of depression or anxiety.  ENDOCRINOLOGIC: No reports of sweating, cold or heat intolerance. No polyuria or polydipsia.  [name]   Physical Examination Vitals:   04/07/18 1043  BP: 110/64  Pulse: 60   Vitals:   04/07/18 1043  Weight: 149 lb 9.6 oz (67.9 kg)  Height: 6' (1.829 m)    Gen: resting comfortably, no acute distress HEENT: no scleral icterus, pupils equal round and reactive, no palptable cervical adenopathy,  CV: RRR, no m/r/g, no jvd Resp: Clear to auscultation bilaterally GI: abdomen is soft, non-tender, non-distended, normal bowel sounds, no hepatosplenomegaly MSK: extremities are warm, no edema.  Skin: warm, no rash Neuro:  no focal deficits Psych: appropriate affect   Diagnostic Studies 11/2012 [facility] Procedural Findings:  Hemodynamics:  AO 117/52 with a mean of 79 mmHg  LV 116/14 mmHg  Coronary angiography:  Coronary dominance: right  Left mainstem: The left main coronary is normal.  Left anterior descending (LAD): The left anterior descending artery is diffusely diseased in the proximal vessel up to 75%. It is occluded in the mid vessel. There are left to left collaterals from the ramus intermediate Jorge Lester. There are also right-to-left collaterals via septal perforators to the distal LAD. The  proximal Is well-defined and there appears to be a small tract through the occlusion.  There is a ramus intermediate Jorge Lester which is moderate to large. It has mild irregularities less than 20%.  Left circumflex (LCx): The left circumflex gives rise to a single bifurcating marginal Jorge Lester. The proximal OM has a segmental 60% stenosis.  Right coronary artery (RCA): The right coronary is a large dominant vessel. It is diffusely diseased throughout with stenosis up to 40% in the mid vessel. There is significant plaque burden throughout without significant stenosis.  Left ventriculography: Left ventricular systolic function is normal, LVEF is estimated at 55-65%, there is no significant mitral regurgitation  Final Conclusions:  1. Single vessel occlusive coronary disease with chronic total occlusion of the mid LAD.  2. Normal left ventricular function.  Recommendations: The patient has significant symptoms and ischemia on noninvasive testing despite 2 antianginal agents. His LAD CTO appears  suitable for PCI. The alternative would be to increase his antianginal therapy. I will discuss options with the patient.   01/01/13 Lesion Data:  Vessel: LAD  Percent stenosis (pre): 100%  TIMI-flow (pre): 0  Stent: 2.5 x 38 and 2.75 x 12 mm Promus premier stents  Percent stenosis (post): 0%  TIMI-flow (post): 3  Conclusions:  Successful stenting of the mid LAD for CTO with drug-eluting stents.   11/2012 Echo LVEF 55-60%, grade II diastolic dysfunction,   11/2015 echo Study Conclusions  - Left ventricle: The cavity size was normal. Wall thickness was  normal. Systolic function was normal. The estimated ejection  fraction was in the range of 55% to 60%. Wall motion was normal;  there were no regional wall motion abnormalities. Doppler  parameters are consistent with abnormal left ventricular  relaxation (grade 1 diastolic dysfunction). - Aortic valve: There was mild  regurgitation. Valve area (VTI):  2.49 cm^2. Valve area (Vmax): 2.39 cm^2. Valve area (Vmean): 2.42  cm^2. - Mitral valve: There was mild regurgitation. - Right ventricle: The cavity size was mildly dilated. - Right atrium: The atrium was mildly dilated. - Technically adequate study.  12/2015 MPI Perfusion Summary Defect 1:  There is a small defect of mild severity present in the apical anterior location. The defect is partially reversible. Small, mild intensity, partially reversible apical anterior defect suggesting a minor region of ischemia.  Defect 2:  There is a small defect of mild severity present in the mid inferoseptal and apical inferior location. The defect is non-reversible. Small, mild intensity, mid to apical inferior/inferoseptal defect consistent with soft tissue attenuation.    Overall Study Impression Myocardial perfusion is abnormal. This is a low risk study. Overall left ventricular systolic function was normal. Nuclear stress EF: 66%    08/2016 PFTs +COPD      Assessment and Plan  1. CAD  -patient s/p DES to LAD CTO in June 2014 - no recent symptoms - medical therapy limited by orthostatic hypotension, continues to tolerate low dose lopressor - continue current meds - EKG SR, anteroseptal Qwaves, no acute ischemic changes  2. Orthostatic hypotension - continue hydration, increased sodium intake - overall mild symptoms at times, continue to monitor.    3. Hyperlipidemia  - he changed back to simva 40mg  on his own, no side effects on atorva 80 but uncomfortable taking that dose.  -LDL remains at goal, continue statin.       Antoine PocheJonathan F. Joniel Graumann, M.D.

## 2018-04-23 ENCOUNTER — Encounter: Payer: Self-pay | Admitting: Neurology

## 2018-04-23 ENCOUNTER — Other Ambulatory Visit: Payer: Self-pay

## 2018-04-23 ENCOUNTER — Ambulatory Visit: Payer: Medicare Other | Admitting: Neurology

## 2018-04-23 VITALS — BP 117/64 | HR 64 | Resp 20 | Ht 72.0 in | Wt 150.0 lb

## 2018-04-23 DIAGNOSIS — G20 Parkinson's disease: Secondary | ICD-10-CM | POA: Diagnosis not present

## 2018-04-23 DIAGNOSIS — I951 Orthostatic hypotension: Secondary | ICD-10-CM | POA: Diagnosis not present

## 2018-04-23 DIAGNOSIS — R269 Unspecified abnormalities of gait and mobility: Secondary | ICD-10-CM | POA: Diagnosis not present

## 2018-04-23 NOTE — Progress Notes (Signed)
Reason for visit: Parkinson's disease, orthostatic hypotension  Jorge Lester is an 84 y.o. male  History of present illness:  Jorge Lester is an 84 year old right-handed white male with a history of Parkinson's disease.  The patient has done relatively well, he remains on Requip, Sinemet CR and Sinemet 25/100 mg tablets.  He takes Seroquel in low-dose for visual hallucinations, this has not been a problem recently.  He has not had significant issues with orthostasis, he has not had any blackouts or falls.  He denies issues with chewing or swallowing.  He is usually sleeping fairly well at night.  He is remaining active, he does a lot of yard work.  The patient does not use a cane or a walker to get around.  The patient returns to this office for an evaluation.  Overall, he believes there has not been much change in his ability to ambulate.  Past Medical History:  Diagnosis Date  . CAD (coronary artery disease)    a. CTO of LAD known since 2008, s/p DES x 2 to CTO 01/01/13. b. known residual nonobstructive disease in LCx, RCA.  [name] COPD (chronic obstructive pulmonary disease) (HCC)   . GERD (gastroesophageal reflux disease)   . Gout   . Hyperlipidemia   . Hypertension   . Mitral regurgitation    Prev mild, most recently trivial by echo 11/2012.  . Orthostatic hypotension 06/04/2017  . Parkinson's disease   . Sleep apnea     Past Surgical History:  Procedure Laterality Date  . CARDIAC CATHETERIZATION  2008  . CORONARY ANGIOPLASTY WITH STENT PLACEMENT  01/01/2013   "2" (01/01/2013)  . LUMBAR DISC SURGERY  1974  . PERCUTANEOUS CORONARY STENT INTERVENTION (PCI-S) N/A 01/01/2013   Procedure: PERCUTANEOUS CORONARY STENT INTERVENTION (PCI-S);  Surgeon: Peter M Swaziland, MD;  Location: [facility] CATH LAB;  Service: Cardiovascular;  Laterality: N/A;  . TONSILLECTOMY AND ADENOIDECTOMY  1947  . TRIGGER FINGER RELEASE  1980's  . VASECTOMY      Family History  Problem Relation Age of Onset  .  Ovarian cancer Mother   . Coronary artery disease Father   . Alzheimer's disease Father   . Diabetes Brother   . Diabetes Other   . Transient ischemic attack Other     Social history:  reports that he quit smoking about 28 years ago. His smoking use included cigarettes. He started smoking about 72 years ago. He has a 88.00 pack-year smoking history. He has never used smokeless tobacco. He reports that he does not drink alcohol or use drugs.    Allergies  Allergen Reactions  . Amantadines     Hallucinations  . Sulfonamide Derivatives     Medications:  Prior to Admission medications   Medication Sig Start Date End Date Taking? Authorizing Provider  acetaminophen (TYLENOL 8 HOUR) 650 MG CR tablet Take 1,300 mg by mouth every morning.   Yes [provider]  aspirin 81 MG tablet Take 81 mg by mouth daily. Reported on 01/24/2016   Yes [provider]  carbidopa-levodopa (SINEMET CR) 50-200 MG tablet TAKE 1/2 TABLET BY MOUTH IN THE MORNING AND AT NOON, THEN 1 TABLET INTHE EVENING. 08/01/17  Yes York Spaniel, MD  carbidopa-levodopa (SINEMET IR) 25-100 MG tablet TAKE 1 TABLET IN THE MORNING AND AT NOON, AND TAKE 1/2 TABLET IN THE EVENING 01/07/18  Yes York Spaniel, MD  doxazosin (CARDURA) 2 MG tablet Take 2 mg by mouth at bedtime.  Yes [provider]  famotidine (PEPCID) 20 MG tablet TAKE (1) TABLET TWICE DAILY. 03/07/15  Yes Antoine Poche, MD  metoprolol tartrate (LOPRESSOR) 25 MG tablet TAKE 1/2 TABLET BY MOUTH TWICE DAILY 10/09/17  Yes Branch, Dorothe Pea, MD  mirtazapine (REMERON) 15 MG tablet Take 1 tablet (15 mg total) by mouth at bedtime. 01/07/18  Yes York Spaniel, MD  nitroGLYCERIN (NITROSTAT) 0.4 MG SL tablet Place 1 tablet (0.4 mg total) under the tongue every 5 (five) minutes as needed. 11/13/12  Yes Serpe, Clide Deutscher, PA-C  QUEtiapine (SEROQUEL) 25 MG tablet TAKE (1) TABLET DAILY AT BEDTIME. 06/04/17  Yes York Spaniel, MD  ropinirole  (REQUIP) 5 MG tablet TAKE 1 TABLET BY MOUTH BEFORE BEDTIME 12/05/17  Yes York Spaniel, MD  simvastatin (ZOCOR) 40 MG tablet Take 1 tablet by mouth daily. Changed by Dr. Margo Common 11/28/15  Yes [provider]    ROS:  Out of a complete 14 system review of symptoms, the patient complains only of the following symptoms, and all other reviewed systems are negative.  Tremors  Blood pressure 117/64, pulse 64, resp. rate 20, height 6' (1.829 m), weight 150 lb (68 kg).   Blood pressure, right arm, sitting is 128/60.  Blood pressure, right arm, standing is 106/58.  Physical Exam  General: The patient is alert and cooperative at the time of the examination.  Skin: No significant peripheral edema is noted.   Neurologic Exam  Mental status: The patient is alert and oriented x 3 at the time of the examination. The patient has apparent normal recent and remote memory, with an apparently normal attention span and concentration ability.   Cranial nerves: Facial symmetry is present. Speech is normal, no aphasia or dysarthria is noted. Extraocular movements are full. Visual fields are full.  Masking of the face is seen.  Some jaw tremor is noted.  Motor: The patient has good strength in all 4 extremities.  Sensory examination: Soft touch sensation is symmetric on the face, arms, and legs.  Coordination: The patient has good finger-nose-finger and heel-to-shin bilaterally.  Some tremor of the feet with sitting is noted.  Gait and station: The patient has a slightly stooped posture with ambulation, he is able to swing both arms, left greater than right.  Tandem gait is unsteady. Romberg is negative. No drift is seen.  Reflexes: Deep tendon reflexes are symmetric.   Assessment/Plan:  1.  Parkinson's disease  2.  Orthostatic hypotension  The patient is doing fairly well with his mobility at this time.  We will not alter the dose of the Sinemet or Requip.  The patient is well  controlled with his visual hallucinations.  The patient will continue to maintain a good level of physical activity.  He will follow-up in 5 months.  The orthostatic hypotension has not been a significant issue for him recently.  Marlan Palau MD 04/23/2018 11:41 AM  [facility] [address] [place], [place] [address]  Phone [phone] Fax [phone]

## 2018-05-05 ENCOUNTER — Telehealth: Payer: Self-pay | Admitting: *Deleted

## 2018-05-05 NOTE — Telephone Encounter (Signed)
Patient walked into office requesting that Dr. Wyline Mood take him off of his blood pressure medication (Lopressor).  Stated that he knew that he did now want to take him off, but his BP drops when he stands.  States that he has Parkinson's & that bp is concerning for him.  Note sent to provider for advice.

## 2018-05-06 NOTE — Telephone Encounter (Signed)
Patient notified and verbalized understanding. 

## 2018-05-06 NOTE — Telephone Encounter (Signed)
Ok to stop metoprolol  Dominga Ferry MD

## 2018-06-10 ENCOUNTER — Telehealth: Payer: Self-pay | Admitting: *Deleted

## 2018-06-10 MED ORDER — QUETIAPINE FUMARATE 25 MG PO TABS: ORAL_TABLET | ORAL | 3 refills | Status: DC

## 2018-06-10 NOTE — Telephone Encounter (Signed)
[facility] to faxed [facility] from them. Pt. is current with appt's and Seroquel was continued at recent appt/fim

## 2018-06-26 ENCOUNTER — Other Ambulatory Visit: Payer: Self-pay

## 2018-06-26 MED ORDER — CARBIDOPA-LEVODOPA 25-100 MG PO TABS: ORAL_TABLET | ORAL | 1 refills | Status: DC

## 2018-07-04 ENCOUNTER — Other Ambulatory Visit: Payer: Self-pay | Admitting: Neurology

## 2018-08-07 ENCOUNTER — Other Ambulatory Visit: Payer: Self-pay | Admitting: Neurology

## 2018-09-02 ENCOUNTER — Other Ambulatory Visit: Payer: Self-pay | Admitting: *Deleted

## 2018-09-02 ENCOUNTER — Other Ambulatory Visit: Payer: Self-pay | Admitting: Neurology

## 2018-09-16 ENCOUNTER — Encounter: Payer: Self-pay | Admitting: Neurology

## 2018-09-16 ENCOUNTER — Ambulatory Visit: Payer: Medicare Other | Admitting: Neurology

## 2018-09-16 VITALS — BP 153/80 | HR 80 | Ht 72.0 in | Wt 148.3 lb

## 2018-09-16 DIAGNOSIS — I951 Orthostatic hypotension: Secondary | ICD-10-CM

## 2018-09-16 DIAGNOSIS — G20 Parkinson's disease: Secondary | ICD-10-CM

## 2018-09-16 DIAGNOSIS — R269 Unspecified abnormalities of gait and mobility: Secondary | ICD-10-CM | POA: Diagnosis not present

## 2018-09-16 NOTE — Progress Notes (Signed)
Reason for visit: Parkinson's disease  Jorge Lester is an 85 y.o. male  History of present illness:  Jorge Lester is an 85 year old right-handed white male with a history of Parkinson's disease.  The patient overall has been doing relatively well, he claims that he is "slowing down" some as he gets older.  He still tries to remain active.  A denies any further problems with hallucinations, he takes low-dose Seroquel at night.  He has orthostatic hypotension, he also takes Cardura in the evenings.  He indicates that the problems with low blood pressure correlates more with how long he sits during the day rather than whether it is morning or evening.  He tries to drink plenty fluids to maintain his blood pressure.  He does have dizziness sometimes if he has been sitting a long time and then tries to stand up.  He has not had any actual blackouts, he denies any falls.  He denies issues with significant problems with choking with swallowing.  He does have dyskinesias that may become fairly prominent at times, but usually they are under good control.  He returns for an evaluation.  Past Medical History:  Diagnosis Date  . CAD (coronary artery disease)    a. CTO of LAD known since 2008, s/p DES x 2 to CTO 01/01/13. b. known residual nonobstructive disease in LCx, RCA.  [name] COPD (chronic obstructive pulmonary disease) (HCC)   . GERD (gastroesophageal reflux disease)   . Gout   . Hyperlipidemia   . Hypertension   . Mitral regurgitation    Prev mild, most recently trivial by echo 11/2012.  . Orthostatic hypotension 06/04/2017  . Parkinson's disease   . Sleep apnea     Past Surgical History:  Procedure Laterality Date  . CARDIAC CATHETERIZATION  2008  . CORONARY ANGIOPLASTY WITH STENT PLACEMENT  01/01/2013   "2" (01/01/2013)  . LUMBAR DISC SURGERY  1974  . PERCUTANEOUS CORONARY STENT INTERVENTION (PCI-S) N/A 01/01/2013   Procedure: PERCUTANEOUS CORONARY STENT INTERVENTION (PCI-S);  Surgeon:  Peter M Swaziland, MD;  Location: [facility] CATH LAB;  Service: Cardiovascular;  Laterality: N/A;  . TONSILLECTOMY AND ADENOIDECTOMY  1947  . TRIGGER FINGER RELEASE  1980's  . VASECTOMY      Family History  Problem Relation Age of Onset  . Ovarian cancer Mother   . Coronary artery disease Father   . Alzheimer's disease Father   . Diabetes Brother   . Diabetes Other   . Transient ischemic attack Other     Social history:  reports that he quit smoking about 29 years ago. His smoking use included cigarettes. He started smoking about 73 years ago. He has a 88.00 pack-year smoking history. He has never used smokeless tobacco. He reports that he does not drink alcohol or use drugs.    Allergies  Allergen Reactions  . Amantadines     Hallucinations  . Sulfonamide Derivatives     Medications:  Prior to Admission medications   Medication Sig Start Date End Date Taking? Authorizing Provider  acetaminophen (TYLENOL 8 HOUR) 650 MG CR tablet Take 1,300 mg by mouth every morning.   Yes [provider]  aspirin 81 MG tablet Take 81 mg by mouth daily. Reported on 01/24/2016   Yes [provider]  carbidopa-levodopa (SINEMET CR) 50-200 MG tablet TAKE 1/2 TABLET EVERY MORNING and TAKE 1/2 TABLET AT NOON, THEN TAKE 1 TABLET EVERY EVENING. 09/02/18  Yes York Spaniel, MD  carbidopa-levodopa (SINEMET IR) 25-100  MG tablet TAKE 1 TABLET BY MOUTH EVERY MORNING, AT NOON, AND TAKE 1/2 TABLET BY MOUTH EVERY EVENING 09/02/18  Yes York Spaniel, MD  doxazosin (CARDURA) 2 MG tablet Take 2 mg by mouth at bedtime.     Yes [provider]  famotidine (PEPCID) 20 MG tablet TAKE (1) TABLET TWICE DAILY. 03/07/15  Yes Antoine Poche, MD  mirtazapine (REMERON) 15 MG tablet TAKE 1 TABLET BY MOUTH AT BEDTIME 07/04/18  Yes York Spaniel, MD  nitroGLYCERIN (NITROSTAT) 0.4 MG SL tablet Place 1 tablet (0.4 mg total) under the tongue every 5 (five) minutes as needed. 11/13/12  Yes Serpe, Clide Deutscher,  PA-C  QUEtiapine (SEROQUEL) 25 MG tablet TAKE (1) TABLET DAILY AT BEDTIME. 06/10/18  Yes York Spaniel, MD  ropinirole (REQUIP) 5 MG tablet TAKE 1 TABLET BY MOUTH BEFORE BEDTIME 12/05/17  Yes York Spaniel, MD  simvastatin (ZOCOR) 40 MG tablet Take 1 tablet by mouth daily. Changed by Dr. Margo Common 11/28/15  Yes [provider]    ROS:  Out of a complete 14 system review of symptoms, the patient complains only of the following symptoms, and all other reviewed systems are negative.  Dizziness  Blood pressure (!) 153/80, pulse 80, height 6' (1.829 m), weight 148 lb 5 oz (67.3 kg).   Blood pressure, right arm, sitting is 114/60.  Blood pressure, right arm, standing is 100/48.  Physical Exam  General: The patient is alert and cooperative at the time of the examination.  Skin: No significant peripheral edema is noted.   Neurologic Exam  Mental status: The patient is alert and oriented x 3 at the time of the examination. The patient has apparent normal recent and remote memory, with an apparently normal attention span and concentration ability.   Cranial nerves: Facial symmetry is present. Speech is normal, no aphasia or dysarthria is noted. Extraocular movements are full. Visual fields are full.  Mild masking of the face is seen.  Motor: The patient has good strength in all 4 extremities.  Sensory examination: Soft touch sensation is symmetric on the face, arms, and legs.  Coordination: The patient has good finger-nose-finger and heel-to-shin bilaterally.  Dyskinesias are noted including the head and neck and body.  Gait and station: The patient is able to stand easily from a seated position with his arms crossed.  Once up, he is able to ambulate independently, he has a stooped posture, some slight decrease in arm swing.  He has relatively good stride and good turns.  Tandem gait is unsteady.  Romberg is unsteady but he does not fall.  Reflexes: Deep tendon reflexes are  symmetric.   Assessment/Plan:  1.  Parkinson's disease  2.  Orthostatic hypotension  3.  Mild gait instability  Overall, the patient appears to be doing relatively well and he is able to maintain his functional levels.  He does have dyskinesias off and on throughout the day.  We will not alter his medications for Parkinson's disease at this time.  The patient is drinking plenty fluids, he is not on any medication to keep his blood pressure up.  He will follow-up in about 6 months.  Marlan Palau MD 09/16/2018 2:35 PM  [facility] [address] [place], [place] [address]  Phone [phone] Fax [phone]

## 2018-10-02 ENCOUNTER — Ambulatory Visit: Payer: Medicare Other | Admitting: Cardiology

## 2018-11-30 ENCOUNTER — Other Ambulatory Visit: Payer: Self-pay | Admitting: Neurology

## 2018-12-02 ENCOUNTER — Ambulatory Visit: Payer: Medicare Other | Admitting: Cardiology

## 2018-12-30 ENCOUNTER — Other Ambulatory Visit: Payer: Self-pay | Admitting: Neurology

## 2019-01-07 ENCOUNTER — Ambulatory Visit: Payer: Medicare Other | Admitting: Cardiology

## 2019-01-08 ENCOUNTER — Ambulatory Visit (INDEPENDENT_AMBULATORY_CARE_PROVIDER_SITE_OTHER): Payer: Medicare Other | Admitting: Physician Assistant

## 2019-01-08 ENCOUNTER — Encounter: Payer: Self-pay | Admitting: Physician Assistant

## 2019-01-08 ENCOUNTER — Telehealth: Payer: Self-pay | Admitting: Physician Assistant

## 2019-01-08 ENCOUNTER — Encounter: Payer: Self-pay | Admitting: *Deleted

## 2019-01-08 ENCOUNTER — Other Ambulatory Visit: Payer: Self-pay

## 2019-01-08 ENCOUNTER — Telehealth: Payer: Self-pay | Admitting: *Deleted

## 2019-01-08 ENCOUNTER — Ambulatory Visit (HOSPITAL_COMMUNITY)
Admission: RE | Admit: 2019-01-08 | Discharge: 2019-01-08 | Disposition: A | Discharge status: Home / Self Care | Payer: Medicare Other | Source: Ambulatory Visit | Attending: Physician Assistant | Admitting: Physician Assistant

## 2019-01-08 VITALS — BP 132/68 | HR 91 | Temp 98.4°F | Ht 69.0 in | Wt 151.0 lb

## 2019-01-08 DIAGNOSIS — R0602 Shortness of breath: Secondary | ICD-10-CM | POA: Insufficient documentation

## 2019-01-08 DIAGNOSIS — R000 Tachycardia, unspecified: Secondary | ICD-10-CM | POA: Diagnosis not present

## 2019-01-08 DIAGNOSIS — I2510 Atherosclerotic heart disease of native coronary artery without angina pectoris: Secondary | ICD-10-CM

## 2019-01-08 NOTE — Patient Instructions (Addendum)
Medication Instructions:   Your physician recommends that you continue on your current medications as directed. Please refer to the Current Medication list given to you today.  Labwork:  NONE  Testing/Procedures: Your physician has requested that you have an echocardiogram. Echocardiography is a painless test that uses sound waves to create images of your heart. It provides your doctor with information about the size and shape of your heart and how well your heart's chambers and valves are working. This procedure takes approximately one hour. There are no restrictions for this procedure.  A chest x-ray takes a picture of the organs and structures inside the chest, including the heart, lungs, and blood vessels. This test can show several things, including, whether the heart is enlarges; whether fluid is building up in the lungs; and whether pacemaker / defibrillator leads are still in place.  Follow-Up:  Your physician recommends that you schedule a follow-up appointment in: 2-3 months with Dr. Harl Bowie.  Any Other Special Instructions Will Be Listed Below (If Applicable).  If you need a refill on your cardiac medications before your next appointment, please call your pharmacy.

## 2019-01-08 NOTE — Telephone Encounter (Signed)
Per Barrett-echo requested in 1-2 weeks. Echo date changed to 07/02 @1 :00 pm at [facility].

## 2019-01-08 NOTE — Progress Notes (Signed)
Cardiology Office Note   Date:  01/08/2019   ID:  Jorge Lester, DOB [birth year], MRN [id number]  PCP:  Kela MillinBarrino, Alethea Y, MD Cardiologist:  Dina RichBranch, Jonathan, MD 04/07/2018 Theodore Demarkhonda Barrett, PA-C   No chief complaint on file.   History of Present Illness: Jorge Lester is a 85 y.o. male with a history of DES x 2 CTO LAD 2014, nl EF, MV 2017 w/ ?mild ischemia>>low risk, Parkinson's dz, COPD, GERD, HTN, HLD, OSA, med titration limited by soft BPs, orthostatic sx.  Jorge Lester presents for cardiology follow up.  He gets SOB with mild exertion but does not get chest pain. He was able to walk behind the lawnmower for 10" at a time without stopping yesterday.   He does not hear himself wheeze. He used the inhaler as directed but did not pursue refilling it or further rx.  Does not really feel like he needs it.  Does not feel his DOE has changed significantly in the last year.   Has some balance problems, but has not fallen in a long time.  The balance issues limit his walking, but he also feels like he gets weak with ambulation.  The orthostasis is at baseline, he gets a little dizzy when he changes position, but just has to stand still and steady himself for a brief period of time and then will be okay.  He has not had any chest pain except a couple of pinching pains that were brief and not associated with exertion.  He does not understand why his heart rate is so high.  He was taken off the beta-blocker last October because it was felt to be contributing to his orthostatic symptoms. See phone note from Dr. Wyline MoodBranch 05/06/2018.  He was told to stay hydrated to help the orthostasis.  He makes sure to drink 5-8 ounce glasses of water a day.  He does not wake with lower extremity edema, denies orthopnea or PND.   Past Medical History:  Diagnosis Date  . CAD (coronary artery disease)    a. CTO of LAD known since 2008, s/p DES x 2 to CTO 01/01/13. b. known residual  nonobstructive disease in LCx, RCA.  [name]. COPD (chronic obstructive pulmonary disease) (HCC)   . GERD (gastroesophageal reflux disease)   . Gout   . Hyperlipidemia   . Hypertension   . Mitral regurgitation    Prev mild, most recently trivial by echo 11/2012.  . Orthostatic hypotension 06/04/2017  . Parkinson's disease   . Sleep apnea     Past Surgical History:  Procedure Laterality Date  . CARDIAC CATHETERIZATION  2008  . CORONARY ANGIOPLASTY WITH STENT PLACEMENT  01/01/2013   "2" (01/01/2013)  . LUMBAR DISC SURGERY  1974  . PERCUTANEOUS CORONARY STENT INTERVENTION (PCI-S) N/A 01/01/2013   Procedure: PERCUTANEOUS CORONARY STENT INTERVENTION (PCI-S);  Surgeon: Peter M SwazilandJordan, MD;  [facility];  Service: Cardiovascular;  Laterality: N/A;  . TONSILLECTOMY AND ADENOIDECTOMY  1947  . TRIGGER FINGER RELEASE  1980's  . VASECTOMY      Current Outpatient Medications  Medication Sig Dispense Refill  . acetaminophen (TYLENOL 8 HOUR) 650 MG CR tablet Take 1,300 mg by mouth every morning.    [name]. aspirin 81 MG tablet Take 81 mg by mouth daily. Reported on 01/24/2016    . carbidopa-levodopa (SINEMET CR) 50-200 MG tablet TAKE 1/2 TABLET EVERY MORNING and TAKE 1/2 TABLET AT NOON, THEN TAKE 1 TABLET EVERY EVENING. 60 tablet 11  .  carbidopa-levodopa (SINEMET IR) 25-100 MG tablet TAKE 1 TABLET BY MOUTH EVERY MORNING, AT NOON, AND TAKE 1/2 TABLET BY MOUTH EVERY EVENING 225 tablet 1  . doxazosin (CARDURA) 2 MG tablet Take 2 mg by mouth at bedtime.      . famotidine (PEPCID) 20 MG tablet TAKE (1) TABLET TWICE DAILY. 60 tablet 3  . mirtazapine (REMERON) 15 MG tablet TAKE 1 TABLET BY MOUTH AT BEDTIME 90 tablet 1  . nitroGLYCERIN (NITROSTAT) 0.4 MG SL tablet Place 1 tablet (0.4 mg total) under the tongue every 5 (five) minutes as needed. 25 tablet 3  . QUEtiapine (SEROQUEL) 25 MG tablet TAKE (1) TABLET DAILY AT BEDTIME. 90 tablet 3  . ropinirole (REQUIP) 5 MG tablet TAKE 1 TABLET BY MOUTH BEFORE bedtime  30 tablet 11  . simvastatin (ZOCOR) 40 MG tablet Take 1 tablet by mouth daily. Changed by Dr. Margo Commonapper     No current facility-administered medications for this visit.     Allergies:   Amantadines and Sulfonamide derivatives    Social History:  The patient  reports that he quit smoking about 29 years ago. His smoking use included cigarettes. He started smoking about 73 years ago. He has a 88.00 pack-year smoking history. He has never used smokeless tobacco. He reports that he does not drink alcohol or use drugs.   Family History:  The patient's family history includes Alzheimer's disease in his father; Coronary artery disease in his father; Diabetes in his brother and another family member; Ovarian cancer in his mother; Transient ischemic attack in an other family member.  He indicated that his mother is deceased. He indicated that his father is deceased. He indicated that his brother is alive. He indicated that the status of his other is unknown.   ROS:  Please see the history of present illness. All other systems are reviewed and negative.    PHYSICAL EXAM: VS:  BP 132/68   Pulse 91   Temp 98.4 F (36.9 C)   Ht 5\' 9"  (1.753 m)   Wt 151 lb (68.5 kg)   SpO2 98%   BMI 22.30 kg/m  , BMI Body mass index is 22.3 kg/m. GEN: Well nourished, frail, elderly, male in no acute distress HEENT: normal for age  Neck: JVD 9 cm, no carotid bruit, no masses Cardiac: RRR; soft murmur, no rubs, or gallops Respiratory: Decreased breath sounds with some basilar rales bilaterally, normal work of breathing GI: soft, nontender, nondistended, + BS MS: no deformity or atrophy; no edema; distal pulses are 2+ in all 4 extremities  Skin: warm and dry, no rash Neuro:  Strength and sensation are intact Psych: euthymic mood, full affect   EKG:  EKG is ordered today. The ekg ordered today demonstrates sinus rhythm, heart rate 91, incomplete right bundle with QRS 96 ms (QRS duration unchanged), minor  morphology changes from 04/07/2018, unclear significance  ECHO: 11/2015 - Left ventricle: The cavity size was normal. Wall thickness was   normal. Systolic function was normal. The estimated ejection   fraction was in the range of 55% to 60%. Wall motion was normal;   there were no regional wall motion abnormalities. Doppler   parameters are consistent with abnormal left ventricular   relaxation (grade 1 diastolic dysfunction). - Aortic valve: There was mild regurgitation. Valve area (VTI):   2.49 cm^2. Valve area (Vmax): 2.39 cm^2. Valve area (Vmean): 2.42   cm^2. - Mitral valve: There was mild regurgitation. - Right ventricle: The cavity size was mildly  dilated. - Right atrium: The atrium was mildly dilated. - Technically adequate study.  PCI: 01/01/2013 Lesion Data: Vessel: LAD Percent stenosis (pre): 100% TIMI-flow (pre):  0 Stent:  2.5 x 38 and 2.75 x 12 mm Promus premier stents Percent stenosis (post): 0% TIMI-flow (post): 3  Conclusions:  Successful stenting of the mid LAD for CTO with drug-eluting stents.  DIAGNOSTIC CATH: 12/02/2012 Left mainstem: The left main coronary is normal.  Left anterior descending (LAD): The left anterior descending artery is diffusely diseased in the proximal vessel up to 75%. It is occluded in the mid vessel. There are left to left collaterals from the ramus intermediate branch. There are also right-to-left collaterals via septal perforators to the distal LAD. The proximal Is well-defined and there appears to be a small tract through the occlusion.  There is a ramus intermediate branch which is moderate to large. It has mild irregularities less than 20%.  Left circumflex (LCx): The left circumflex gives rise to a single bifurcating marginal branch. The proximal OM has a segmental 60% stenosis.  Right coronary artery (RCA): The right coronary is a large dominant vessel. It is diffusely diseased throughout with stenosis up to 40% in the mid  vessel. There is significant plaque burden throughout without significant stenosis.  Left ventriculography: Left ventricular systolic function is normal, LVEF is estimated at 55-65%, there is no significant mitral regurgitation   Final Conclusions:   1. Single vessel occlusive coronary disease with chronic total occlusion of the mid LAD. 2. Normal left ventricular function.  Recommendations: The patient has significant symptoms and ischemia on noninvasive testing despite 2 antianginal agents. His LAD CTO appears suitable for PCI. The alternative would be to increase his antianginal therapy. I will discuss options with the patient.  Recent Labs: No results found for requested labs within last 8760 hours.  CBC    Component Value Date/Time   WBC 7.3 01/02/2013 0523   RBC 4.07 (L) 01/02/2013 0523   HGB 12.0 (L) 01/02/2013 0523   HCT 35.9 (L) 01/02/2013 0523   PLT 146 (L) 01/02/2013 0523   MCV 88.2 01/02/2013 0523   MCH 29.5 01/02/2013 0523   MCHC 33.4 01/02/2013 0523   RDW 13.7 01/02/2013 0523   CMP Latest Ref Rng & Units 01/02/2013 01/01/2013  Glucose 70 - 99 mg/dL 106(H) 107(H)  BUN 6 - 23 mg/dL 20 26(H)  Creatinine 0.50 - 1.35 mg/dL 1.08 1.09  Sodium 135 - 145 mEq/L 138 137  Potassium 3.5 - 5.1 mEq/L 4.2 4.0  Chloride 96 - 112 mEq/L 104 103  CO2 19 - 32 mEq/L 26 26  Calcium 8.4 - 10.5 mg/dL 8.6 8.8     Lipid Panel No results found for: CHOL, HDL, LDLCALC, LDLDIRECT, TRIG, CHOLHDL    Wt Readings from Last 3 Encounters:  01/08/19 151 lb (68.5 kg)  09/16/18 148 lb 5 oz (67.3 kg)  04/23/18 150 lb (68 kg)     Other studies Reviewed: Additional studies/ records that were reviewed today include: Office notes, hospital records and testing.  ASSESSMENT AND PLAN:  1.  Dyspnea on exertion - His dyspnea on exertion is significant, and he has JVD, but no other obvious volume overload -He ambulated for a few minutes in the office today and reported shortness of breath, but his  O2 saturation remained 97% on room air -  Check a chest x-ray - Check an echocardiogram - He has not been immobilized, and was able to walk 10 minutes at a time while  mowing the lawn yesterday and no signs or symptoms of DVT.  Therefore, I will not check a d-dimer at this time.  However, if he has right heart strain on his echo, consider PE eval  2.  Tachycardia: - His beta-blocker was metoprolol 25 mg.  It was cut back to 1/2 tablet twice a day in 2018, and discontinued in 2019. - Because of his history of CAD, a beta-blocker is indicated, but not sure he can tolerate it. - No med changes for now - If his chest x-ray and echocardiogram are okay, especially if PAS is low, increase water intake  3.  CAD - Continue aspirin, beta-blocker as discussed above, simvastatin and sublingual nitroglycerin as needed -No ischemic symptoms, no additional testing as long as EF is preserved   Current medicines are reviewed at length with the patient today.  The patient does not have concerns regarding medicines.  The following changes have been made:  no change  Labs/ tests ordered today include:   Orders Placed This Encounter  Procedures  . DG Chest 2 View  . ECHOCARDIOGRAM COMPLETE     Disposition:   FU with Dina RichBranch, Jonathan, MD  Signed, Theodore Demarkhonda Barrett, PA-C  01/08/2019 5:22 PM    [facility] Phone: [phone]; Fax: [phone]

## 2019-01-08 NOTE — Telephone Encounter (Signed)
°  Precert needed for: echo   Location:  [facility]    Date:  January 15, 2019

## 2019-01-09 NOTE — Addendum Note (Signed)
Addended by: Laurine Blazer on: 01/09/2019 11:28 AM   Modules accepted: Orders

## 2019-01-12 NOTE — Telephone Encounter (Signed)
Patient informed and verbalized understanding

## 2019-01-15 ENCOUNTER — Other Ambulatory Visit: Payer: Self-pay

## 2019-01-15 ENCOUNTER — Ambulatory Visit (HOSPITAL_COMMUNITY)
Admission: RE | Admit: 2019-01-15 | Discharge: 2019-01-15 | Disposition: A | Discharge status: Home / Self Care | Payer: Medicare Other | Source: Ambulatory Visit | Attending: Physician Assistant | Admitting: Physician Assistant

## 2019-01-15 DIAGNOSIS — R0602 Shortness of breath: Secondary | ICD-10-CM | POA: Diagnosis not present

## 2019-01-15 NOTE — Progress Notes (Signed)
2D Echocardiogram has been performed.  Jorge Lester 01/15/2019, 1:27 PM

## 2019-01-19 ENCOUNTER — Telehealth: Payer: Self-pay | Admitting: *Deleted

## 2019-01-19 NOTE — Telephone Encounter (Signed)
-----   Message from Lonn Georgia, PA-C sent at 01/19/2019 10:26 AM EDT ----- Please let him know that his heart muscle function is good.  His heart is strong. Pressures are low-normal, please drink a little more water every day, 1 glass or so. Keep f/u appts. Thanks

## 2019-01-19 NOTE — Telephone Encounter (Signed)
Patient informed. Copy sent to PCP °

## 2019-02-05 ENCOUNTER — Other Ambulatory Visit: Payer: Medicare Other

## 2019-02-26 ENCOUNTER — Other Ambulatory Visit: Payer: Self-pay | Admitting: Neurology

## 2019-03-17 ENCOUNTER — Other Ambulatory Visit: Payer: Self-pay

## 2019-03-17 ENCOUNTER — Ambulatory Visit: Payer: Medicare Other | Admitting: Cardiology

## 2019-03-17 ENCOUNTER — Encounter: Payer: Self-pay | Admitting: Cardiology

## 2019-03-17 VITALS — BP 149/70 | HR 89 | Temp 97.0°F | Ht 70.0 in | Wt 153.4 lb

## 2019-03-17 DIAGNOSIS — R0602 Shortness of breath: Secondary | ICD-10-CM

## 2019-03-17 DIAGNOSIS — I2510 Atherosclerotic heart disease of native coronary artery without angina pectoris: Secondary | ICD-10-CM | POA: Diagnosis not present

## 2019-03-17 NOTE — Patient Instructions (Signed)
Your physician recommends that you schedule a follow-up appointment in: [place]  Your physician recommends that you continue on your current medications as directed. Please refer to the Current Medication list given to you today.  You have been referred to PULMONOLOGY   Thank you for choosing [facility]!!

## 2019-03-17 NOTE — Progress Notes (Signed)
Clinical Summary Jorge Lester is a 85 y.o.male seen today for follow up of the following medical problems. This is a focused visit on recent issues of SOB/DOE  1. CAD  - PCI to LAD CTO 12/2012 with DES x2  - 11/2012 echo LVEF 55-60%,  - 12/2015 nuclear stress low risk, possible mild ischemia  Medical therapy limited by soft bp's, orthostatic symptoms.    - denies any recent chest pain.     2. SOB 12/2018 CXR: no acute process - 01/2019 echo: LVEF 55-60%, grade I diastolic dysfunction, mild AI  - chronic SOB all the time, worst with activity. No coughing or wheezing - he previously stopped inhalers, felt did not help.  - no chest pain - no severe edema. Fairly stable weight.     3. COPD - abnormal PFTs 08/2016 - he tried spiriva but without benefit and stopped taking on his own.     4. Parkinsons disease - followed by neuro    Past Medical History:  Diagnosis Date  . CAD (coronary artery disease)    a. CTO of LAD known since 2008, s/p DES x 2 to CTO 01/01/13. b. known residual nonobstructive disease in LCx, RCA.  [name] COPD (chronic obstructive pulmonary disease) (HCC)   . GERD (gastroesophageal reflux disease)   . Gout   . Hyperlipidemia   . Hypertension   . Mitral regurgitation    Prev mild, most recently trivial by echo 11/2012.  . Orthostatic hypotension 06/04/2017  . Parkinson's disease   . Sleep apnea      Allergies  Allergen Reactions  . Amantadines     Hallucinations  . Sulfonamide Derivatives      Current Outpatient Medications  Medication Sig Dispense Refill  . acetaminophen (TYLENOL 8 HOUR) 650 MG CR tablet Take 1,300 mg by mouth every morning.    [name] aspirin 81 MG tablet Take 81 mg by mouth daily. Reported on 01/24/2016    . carbidopa-levodopa (SINEMET CR) 50-200 MG tablet TAKE 1/2 TABLET EVERY MORNING and TAKE 1/2 TABLET AT NOON, THEN TAKE 1 TABLET EVERY EVENING. 60 tablet 11  . carbidopa-levodopa (SINEMET IR) 25-100 MG tablet TAKE 1  TABLET BY MOUTH EVERY MORNING, AT NOON AND TAKE 1/2 TABLET BY MOUTH EVERY EVENING 225 tablet 1  . doxazosin (CARDURA) 2 MG tablet Take 2 mg by mouth at bedtime.      . famotidine (PEPCID) 20 MG tablet TAKE (1) TABLET TWICE DAILY. 60 tablet 3  . mirtazapine (REMERON) 15 MG tablet TAKE 1 TABLET BY MOUTH AT BEDTIME 90 tablet 1  . nitroGLYCERIN (NITROSTAT) 0.4 MG SL tablet Place 1 tablet (0.4 mg total) under the tongue every 5 (five) minutes as needed. 25 tablet 3  . QUEtiapine (SEROQUEL) 25 MG tablet TAKE (1) TABLET DAILY AT BEDTIME. 90 tablet 3  . ropinirole (REQUIP) 5 MG tablet TAKE 1 TABLET BY MOUTH BEFORE bedtime 30 tablet 11  . simvastatin (ZOCOR) 40 MG tablet Take 1 tablet by mouth daily. Changed by Dr. Margo Common     No current facility-administered medications for this visit.      Past Surgical History:  Procedure Laterality Date  . CARDIAC CATHETERIZATION  2008  . CORONARY ANGIOPLASTY WITH STENT PLACEMENT  01/01/2013   "2" (01/01/2013)  . LUMBAR DISC SURGERY  1974  . PERCUTANEOUS CORONARY STENT INTERVENTION (PCI-S) N/A 01/01/2013   Procedure: PERCUTANEOUS CORONARY STENT INTERVENTION (PCI-S);  Surgeon: Peter M Swaziland, MD;  Location: [facility] CATH LAB;  Service: Cardiovascular;  Laterality: N/A;  . TONSILLECTOMY AND ADENOIDECTOMY  1947  . TRIGGER FINGER RELEASE  1980's  . VASECTOMY       Allergies  Allergen Reactions  . Amantadines     Hallucinations  . Sulfonamide Derivatives       Family History  Problem Relation Age of Onset  . Ovarian cancer Mother   . Coronary artery disease Father   . Alzheimer's disease Father   . Diabetes Brother   . Diabetes Other   . Transient ischemic attack Other      Social History Jorge Lester reports that he quit smoking about 29 years ago. His smoking use included cigarettes. He started smoking about 73 years ago. He has a 88.00 pack-year smoking history. He has never used smokeless tobacco. Jorge Lester reports no history of alcohol use.    Review of Systems CONSTITUTIONAL: No weight loss, fever, chills, weakness or fatigue.  HEENT: Eyes: No visual loss, blurred vision, double vision or yellow sclerae.No hearing loss, sneezing, congestion, runny nose or sore throat.  SKIN: No rash or itching.  CARDIOVASCULAR: per hpi RESPIRATORY: No shortness of breath, cough or sputum.  GASTROINTESTINAL: No anorexia, nausea, vomiting or diarrhea. No abdominal pain or blood.  GENITOURINARY: No burning on urination, no polyuria NEUROLOGICAL: No headache, dizziness, syncope, paralysis, ataxia, numbness or tingling in the extremities. No change in bowel or bladder control.  MUSCULOSKELETAL: No muscle, back pain, joint pain or stiffness.  LYMPHATICS: No enlarged nodes. No history of splenectomy.  PSYCHIATRIC: No history of depression or anxiety.  ENDOCRINOLOGIC: No reports of sweating, cold or heat intolerance. No polyuria or polydipsia.  [name]   Physical Examination Today's Vitals   03/17/19 1449  BP: (!) 149/70  Pulse: 89  Temp: (!) 97 F (36.1 C)  SpO2: 99%  Weight: 153 lb 6.4 oz (69.6 kg)  Height: 5\' 10"  (1.778 m)   Body mass index is 22.01 kg/m.  Gen: resting comfortably, no acute distress HEENT: no scleral icterus, pupils equal round and reactive, no palptable cervical adenopathy,  CV: RRR, no mr/g, no jvd Resp: Clear to auscultation bilaterally GI: abdomen is soft, non-tender, non-distended, normal bowel sounds, no hepatosplenomegaly MSK: extremities are warm, no edema.  Skin: warm, no rash Neuro:  no focal deficits Psych: appropriate affect   Diagnostic Studies  11/2012 Cath Procedural Findings:  Hemodynamics:  AO 117/52 with a mean of 79 mmHg  LV 116/14 mmHg  Coronary angiography:  Coronary dominance: right  Left mainstem: The left main coronary is normal.  Left anterior descending (LAD): The left anterior descending artery is diffusely diseased in the proximal vessel up to 75%. It is occluded in the mid  vessel. There are left to left collaterals from the ramus intermediate Jorge Lester. There are also right-to-left collaterals via septal perforators to the distal LAD. The proximal Is well-defined and there appears to be a small tract through the occlusion.  There is a ramus intermediate Jorge Lester which is moderate to large. It has mild irregularities less than 20%.  Left circumflex (LCx): The left circumflex gives rise to a single bifurcating marginal Jorge Lester. The proximal OM has a segmental 60% stenosis.  Right coronary artery (RCA): The right coronary is a large dominant vessel. It is diffusely diseased throughout with stenosis up to 40% in the mid vessel. There is significant plaque burden throughout without significant stenosis.  Left ventriculography: Left ventricular systolic function is normal, LVEF is estimated at 55-65%, there is no significant mitral regurgitation  Final Conclusions:  1.  Single vessel occlusive coronary disease with chronic total occlusion of the mid LAD.  2. Normal left ventricular function.  Recommendations: The patient has significant symptoms and ischemia on noninvasive testing despite 2 antianginal agents. His LAD CTO appears suitable for PCI. The alternative would be to increase his antianginal therapy. I will discuss options with the patient.   01/01/13 Lesion Data:  Vessel: LAD  Percent stenosis (pre): 100%  TIMI-flow (pre): 0  Stent: 2.5 x 38 and 2.75 x 12 mm Promus premier stents  Percent stenosis (post): 0%  TIMI-flow (post): 3  Conclusions:  Successful stenting of the mid LAD for CTO with drug-eluting stents.   11/2012 Echo LVEF 55-60%, grade II diastolic dysfunction,   11/2015 echo Study Conclusions  - Left ventricle: The cavity size was normal. Wall thickness was  normal. Systolic function was normal. The estimated ejection  fraction was in the range of 55% to 60%. Wall motion was normal;  there were no regional wall motion  abnormalities. Doppler  parameters are consistent with abnormal left ventricular  relaxation (grade 1 diastolic dysfunction). - Aortic valve: There was mild regurgitation. Valve area (VTI):  2.49 cm^2. Valve area (Vmax): 2.39 cm^2. Valve area (Vmean): 2.42  cm^2. - Mitral valve: There was mild regurgitation. - Right ventricle: The cavity size was mildly dilated. - Right atrium: The atrium was mildly dilated. - Technically adequate study.  12/2015 MPI Perfusion Summary Defect 1:  There is a small defect of mild severity present in the apical anterior location. The defect is partially reversible. Small, mild intensity, partially reversible apical anterior defect suggesting a minor region of ischemia.  Defect 2:  There is a small defect of mild severity present in the mid inferoseptal and apical inferior location. The defect is non-reversible. Small, mild intensity, mid to apical inferior/inferoseptal defect consistent with soft tissue attenuation.    Overall Study Impression Myocardial perfusion is abnormal. This is a low risk study. Overall left ventricular systolic function was normal. Nuclear stress EF: 66%    08/2016 PFTs +COPD   01/2019 echo IMPRESSIONS    1. The left ventricle has normal systolic function, with an ejection fraction of 55-60%. The cavity size was normal. There is mildly increased left ventricular wall thickness. Left ventricular diastolic Doppler parameters are consistent with impaired  relaxation. No evidence of left ventricular regional wall motion abnormalities.  2. The right ventricle has normal systolic function. The cavity was normal. There is no increase in right ventricular wall thickness. Right ventricular systolic pressure could not be assessed.  3. The aortic valve is tricuspid. Mild calcification of the aortic valve. Aortic valve regurgitation is mild by color flow Doppler. Mild aortic annular calcification noted.  4. The mitral valve is  grossly normal. There is mild mitral annular calcification present.  5. The tricuspid valve is grossly normal.  6. The aortic root is normal in size and structure.   Assessment and Plan  1. CAD  -no recent chest pain, continue to monitor  2.SOB - fairly benign CXR and echo, he does not appear volume overloaded. Symptoms don't really fit ischemia, as he often is SOB even at rest. Dont see any indication for ischemic testing at this time.  - abnormal PFTs in 2018, he stopped treatment on his own. Suspect may be the etiology of his symptoms, will refer to pulmonary     F/u 4 months    Antoine PocheJonathan F. Nautica Hotz, M.D

## 2019-03-26 ENCOUNTER — Other Ambulatory Visit: Payer: Self-pay

## 2019-03-26 ENCOUNTER — Encounter: Payer: Self-pay | Admitting: Neurology

## 2019-03-26 ENCOUNTER — Ambulatory Visit (INDEPENDENT_AMBULATORY_CARE_PROVIDER_SITE_OTHER): Payer: Medicare Other | Admitting: Neurology

## 2019-03-26 VITALS — BP 157/83 | HR 82 | Temp 97.7°F | Ht 70.0 in | Wt 153.0 lb

## 2019-03-26 DIAGNOSIS — G20 Parkinson's disease: Secondary | ICD-10-CM | POA: Diagnosis not present

## 2019-03-26 NOTE — Progress Notes (Addendum)
Reason for visit: Parkinson's disease  Jorge Lester is an 85 y.o. male  History of present illness:  Jorge Lester is an 85 year old right-handed white male with a history of Parkinson's disease.  The patient has done fairly well with his ability to remain active.  His main issue at this point is that he is getting significant shortness of breath with exertion, this has been going on a year and he will be seeing a pulmonologist in the near future.  He has been seen through cardiology and was not felt to have any cardiac issues that would explain his shortness of breath.  The patient does not have any freezing events, he did have one fall 2 weeks ago, he did not sustain injury.  He does not use a cane or a walker.  He sleeps about 5 hours a night but has good energy levels during the day.  He now has to stop to rest frequently because of short windedness.  He remains on Sinemet and Requip.  He takes Seroquel in the evening hours, he has not had any further hallucination problems.  Past Medical History:  Diagnosis Date  . CAD (coronary artery disease)    a. CTO of LAD known since 2008, s/p DES x 2 to CTO 01/01/13. b. known residual nonobstructive disease in LCx, RCA.  [name] COPD (chronic obstructive pulmonary disease) ([place])   . GERD (gastroesophageal reflux disease)   . Gout   . Hyperlipidemia   . Hypertension   . Mitral regurgitation    Prev mild, most recently trivial by echo 11/2012.  . Orthostatic hypotension 06/04/2017  . Parkinson's disease   . Sleep apnea     Past Surgical History:  Procedure Laterality Date  . CARDIAC CATHETERIZATION  2008  . CORONARY ANGIOPLASTY WITH STENT PLACEMENT  01/01/2013   "2" (01/01/2013)  . [place]  . PERCUTANEOUS CORONARY STENT INTERVENTION (PCI-S) N/A 01/01/2013   Procedure: PERCUTANEOUS CORONARY STENT INTERVENTION (PCI-S);  Surgeon: Peter M Martinique, MD;  Location: [facility] CATH LAB;  Service: Cardiovascular;  Laterality: N/A;  .  TONSILLECTOMY AND ADENOIDECTOMY  1947  . TRIGGER FINGER RELEASE  1980's  . VASECTOMY      Family History  Problem Relation Age of Onset  . Ovarian cancer Mother   . Coronary artery disease Father   . Alzheimer's disease Father   . Diabetes Brother   . Diabetes Other   . Transient ischemic attack Other     Social history:  reports that he quit smoking about 29 years ago. His smoking use included cigarettes. He started smoking about 73 years ago. He has a 88.00 pack-year smoking history. He has never used smokeless tobacco. He reports that he does not drink alcohol or use drugs.    Allergies  Allergen Reactions  . Amantadines     Hallucinations  . Sulfonamide Derivatives     Medications:  Prior to Admission medications   Medication Sig Start Date End Date Taking? Authorizing Provider  acetaminophen (TYLENOL 8 HOUR) 650 MG CR tablet Take 1,300 mg by mouth every morning.   Yes [provider]  aspirin 81 MG tablet Take 81 mg by mouth daily. Reported on 01/24/2016   Yes [provider]  carbidopa-levodopa (SINEMET CR) 50-200 MG tablet TAKE 1/2 TABLET EVERY MORNING and TAKE 1/2 TABLET AT NOON, THEN TAKE 1 TABLET EVERY EVENING. 09/02/18  Yes Kathrynn Ducking, MD  carbidopa-levodopa (SINEMET IR) 25-100 MG tablet TAKE 1 TABLET BY  MOUTH EVERY MORNING, AT NOON AND TAKE 1/2 TABLET BY MOUTH EVERY EVENING 02/26/19  Yes York SpanielWillis, Maurice Ramseur K, MD  doxazosin (CARDURA) 2 MG tablet Take 2 mg by mouth at bedtime.     Yes [provider]  famotidine (PEPCID) 20 MG tablet TAKE (1) TABLET TWICE DAILY. 03/07/15  Yes Antoine PocheBranch, Jonathan F, MD  mirtazapine (REMERON) 15 MG tablet TAKE 1 TABLET BY MOUTH AT BEDTIME 12/30/18  Yes York SpanielWillis, Tabor Denham K, MD  nitroGLYCERIN (NITROSTAT) 0.4 MG SL tablet Place 1 tablet (0.4 mg total) under the tongue every 5 (five) minutes as needed. 11/13/12  Yes Serpe, Clide DeutscherEugene C, PA-C  QUEtiapine (SEROQUEL) 25 MG tablet TAKE (1) TABLET DAILY AT BEDTIME. 06/10/18  Yes  York SpanielWillis, Meital Riehl K, MD  ropinirole (REQUIP) 5 MG tablet TAKE 1 TABLET BY MOUTH BEFORE bedtime 12/01/18  Yes York SpanielWillis, Brylynn Hanssen K, MD  simvastatin (ZOCOR) 40 MG tablet Take 1 tablet by mouth daily. Changed by Dr. Margo Commonapper 11/28/15  Yes [provider]    ROS:  Out of a complete 14 system review of symptoms, the patient complains only of the following symptoms, and all other reviewed systems are negative.  Shortness of breath Balance problems  Blood pressure (!) 157/83, pulse 82, temperature 97.7 F (36.5 C), temperature source Temporal, height 5\' 10"  (1.778 m), weight 153 lb (69.4 kg).  Physical Exam  General: The patient is alert and cooperative at the time of the examination.  Skin: No significant peripheral edema is noted.   Neurologic Exam  Mental status: The patient is alert and oriented x 3 at the time of the examination. The patient has apparent normal recent and remote memory, with an apparently normal attention span and concentration ability.   Cranial nerves: Facial symmetry is present. Speech is normal, no aphasia or dysarthria is noted. Extraocular movements are full. Visual fields are full.  Motor: The patient has good strength in all 4 extremities.  Sensory examination: Soft touch sensation is symmetric on the face, arms, and legs.  Coordination: The patient has good finger-nose-finger and heel-to-shin bilaterally.  No resting tremors are noted.  Gait and station: The patient is able to rise from seated position with arms crossed.  Once up, he has a stooped posture, he does have fairly symmetric arm swing, he has good stride and good turns.  Tandem gait is slightly unsteady, Romberg is negative but is slightly unsteady.  Reflexes: Deep tendon reflexes are symmetric.   Assessment/Plan:  1.  Parkinson's disease  2.  Gait disorder  3.  Shortness of breath  The patient is having new problems with shortness of breath, this will be evaluated in the near future.   He remains fairly active, his ability to ambulate is good at this point, we will not change his current medication dosing.  He will follow-up in 5 to 6 months.  Marlan Palau. Keith Alessander Sikorski MD 03/26/2019 11:48 AM  [facility] [address] [place], [place] [id number]  Phone [phone] Fax [phone]

## 2019-05-29 ENCOUNTER — Other Ambulatory Visit: Payer: Self-pay | Admitting: Neurology

## 2019-06-29 ENCOUNTER — Other Ambulatory Visit: Payer: Self-pay | Admitting: Neurology

## 2019-08-30 ENCOUNTER — Other Ambulatory Visit: Payer: Self-pay | Admitting: Neurology

## 2019-09-28 ENCOUNTER — Ambulatory Visit: Payer: Medicare Other | Admitting: Neurology

## 2019-09-28 ENCOUNTER — Encounter: Payer: Self-pay | Admitting: Neurology

## 2019-09-28 ENCOUNTER — Other Ambulatory Visit: Payer: Self-pay

## 2019-09-28 VITALS — BP 147/92 | HR 88 | Temp 97.3°F | Ht 70.0 in | Wt 149.0 lb

## 2019-09-28 DIAGNOSIS — G20 Parkinson's disease: Secondary | ICD-10-CM

## 2019-09-28 NOTE — Progress Notes (Signed)
Reason for visit: Parkinson's disease  Jorge Lester is an 86 y.o. male  History of present illness:  Jorge Lester is an 86 year old right-handed white male with a history of Parkinson's disease.  The patient is on Sinemet CR 50/200 mg tablet, 1/2 tablet in the morning and midday and 1 full tab in the evening.  He takes Sinemet 25/100 tablets, 1 in the morning and midday and one half in the evening.  He is on 5 mg Requip at night.  He is remaining fairly active, he reports no falls since last seen.  He does have some problems with dyskinesias but this is not severe.  He sleeps only about 4 hours at night, he gets up 4 or 5 times to urinate.  He may take a nap during the day.  He has had some shortness of breath but never saw pulmonology doctor.  He returns for further evaluation.  He has lost about 4 pounds since last seen, he reports some decrease in appetite.  Past Medical History:  Diagnosis Date  . CAD (coronary artery disease)    a. CTO of LAD known since 2008, s/p DES x 2 to CTO 01/01/13. b. known residual nonobstructive disease in LCx, RCA.  [name] COPD (chronic obstructive pulmonary disease) ([facility])   . GERD (gastroesophageal reflux disease)   . Gout   . Hyperlipidemia   . Hypertension   . Mitral regurgitation    Prev mild, most recently trivial by echo 11/2012.  . Orthostatic hypotension 06/04/2017  . Parkinson's disease   . Sleep apnea     Past Surgical History:  Procedure Laterality Date  . CARDIAC CATHETERIZATION  2008  . CORONARY ANGIOPLASTY WITH STENT PLACEMENT  01/01/2013   "2" (01/01/2013)  . LUMBAR DISC SURGERY  1974  . PERCUTANEOUS CORONARY STENT INTERVENTION (PCI-S) N/A 01/01/2013   Procedure: PERCUTANEOUS CORONARY STENT INTERVENTION (PCI-S);  Surgeon: Peter M Swaziland, MD;  Location: [facility] CATH LAB;  Service: Cardiovascular;  Laterality: N/A;  . TONSILLECTOMY AND ADENOIDECTOMY  1947  . TRIGGER FINGER RELEASE  1980's  . VASECTOMY      Family History  Problem Relation  Age of Onset  . Ovarian cancer Mother   . Coronary artery disease Father   . Alzheimer's disease Father   . Diabetes Brother   . Diabetes Other   . Transient ischemic attack Other     Social history:  reports that he quit smoking about 30 years ago. His smoking use included cigarettes. He started smoking about 74 years ago. He has a 88.00 pack-year smoking history. He has never used smokeless tobacco. He reports that he does not drink alcohol or use drugs.    Allergies  Allergen Reactions  . Amantadines     Hallucinations  . Sulfonamide Derivatives     Medications:  Prior to Admission medications   Medication Sig Start Date End Date Taking? Authorizing Provider  acetaminophen (TYLENOL 8 HOUR) 650 MG CR tablet Take 1,300 mg by mouth every morning.   Yes [provider]  aspirin 81 MG tablet Take 81 mg by mouth daily. Reported on 01/24/2016   Yes [provider]  carbidopa-levodopa (SINEMET CR) 50-200 MG tablet TAKE 1/2 TABLET BY MOUTH EVERY MORNING and TAKE 1/2 TABLET IN THE AFTERNOON and TAKE 1 TABLET BY MOUTH EVERY EVENING 08/31/19  Yes York Spaniel, MD  carbidopa-levodopa (SINEMET IR) 25-100 MG tablet TAKE 1 TABLET BY MOUTH EVERY MORNING, AT NOON AND TAKE 1/2 TABLET BY MOUTH EVERY  EVENING 08/31/19  Yes Kathrynn Ducking, MD  doxazosin (CARDURA) 2 MG tablet Take 2 mg by mouth at bedtime.     Yes [provider]  famotidine (PEPCID) 20 MG tablet TAKE (1) TABLET TWICE DAILY. 03/07/15  Yes Arnoldo Lenis, MD  famotidine (PEPCID) 20 MG tablet TAKE 1 TABLET BY MOUTH TWICE DAILY 08/31/19  Yes [provider]  mirtazapine (REMERON) 15 MG tablet TAKE 1 TABLET BY MOUTH AT BEDTIME 06/29/19  Yes Kathrynn Ducking, MD  nitroGLYCERIN (NITROSTAT) 0.4 MG SL tablet Place 1 tablet (0.4 mg total) under the tongue every 5 (five) minutes as needed. 11/13/12  Yes Serpe, Burna Forts, PA-C  QUEtiapine (SEROQUEL) 25 MG tablet TAKE 1 TABLET BY MOUTH AT BEDTIME 06/01/19  Yes  Kathrynn Ducking, MD  ropinirole (REQUIP) 5 MG tablet TAKE 1 TABLET BY MOUTH BEFORE bedtime 12/01/18  Yes Kathrynn Ducking, MD  simvastatin (ZOCOR) 40 MG tablet Take 1 tablet by mouth daily. Changed by Dr. Scotty Court 11/28/15  Yes [provider]    ROS:  Out of a complete 14 system review of symptoms, the patient complains only of the following symptoms, and all other reviewed systems are negative.  Shortness of breath Walking difficulty  Blood pressure (!) 147/92, pulse 88, temperature (!) 97.3 F (36.3 C), height 5\' 10"  (1.778 m), weight 149 lb (67.6 kg).  Physical Exam  General: The patient is alert and cooperative at the time of the examination.  The patient is thin.  Some dyskinesias are noted with the upper body, head and neck.  Skin: No significant peripheral edema is noted.   Neurologic Exam  Mental status: The patient is alert and oriented x 3 at the time of the examination. The patient has apparent normal recent and remote memory, with an apparently normal attention span and concentration ability.   Cranial nerves: Facial symmetry is present. Speech is normal, no aphasia or dysarthria is noted. Extraocular movements are full. Visual fields are full.  Motor: The patient has good strength in all 4 extremities.  Sensory examination: Soft touch sensation is symmetric on the face, arms, and legs.  Coordination: The patient has good finger-nose-finger and heel-to-shin bilaterally.  Gait and station: The patient is able to arise from a seated position with arms crossed.  Once up, he is able to walk independently.  He has a stooped posture, slight decrease in arm swing.  No tremor is seen.  He has relatively good ability to turn.  Romberg is positive, he falls backwards into the right.  Tandem gait is unsteady.  Reflexes: Deep tendon reflexes are symmetric.   Assessment/Plan:  1.  Parkinson's disease  2.  Gait disturbance  The patient is doing relatively well  with his ability to remain active.  He is now developing some postural instability associated with the Parkinson's disease.  We will not alter the medication dosing.  He will follow-up in 5 to 6 months.  He is to remain active.   Jill Alexanders MD 09/28/2019 12:24 PM  [facility] [address] [place], [place] [address]  Phone [phone] Fax [phone]

## 2019-11-03 ENCOUNTER — Encounter: Payer: Self-pay | Admitting: Critical Care Medicine

## 2019-11-03 ENCOUNTER — Other Ambulatory Visit: Payer: Self-pay

## 2019-11-03 ENCOUNTER — Ambulatory Visit: Payer: Medicare Other | Admitting: Critical Care Medicine

## 2019-11-03 VITALS — BP 140/80 | HR 99 | Temp 98.0°F | Ht 70.08 in | Wt 147.0 lb

## 2019-11-03 DIAGNOSIS — R0609 Other forms of dyspnea: Secondary | ICD-10-CM

## 2019-11-03 DIAGNOSIS — R0600 Dyspnea, unspecified: Secondary | ICD-10-CM | POA: Diagnosis not present

## 2019-11-03 DIAGNOSIS — R1310 Dysphagia, unspecified: Secondary | ICD-10-CM

## 2019-11-03 DIAGNOSIS — J449 Chronic obstructive pulmonary disease, unspecified: Secondary | ICD-10-CM

## 2019-11-03 MED ORDER — ALBUTEROL SULFATE (2.5 MG/3ML) 0.083% IN NEBU: 2.5000 mg | INHALATION_SOLUTION | RESPIRATORY_TRACT | 11 refills | Status: DC | PRN

## 2019-11-03 MED ORDER — LONHALA MAGNAIR STARTER KIT 25 MCG/ML IN SOLN: 1.0000 | Freq: Two times a day (BID) | RESPIRATORY_TRACT | 0 refills | Status: DC

## 2019-11-03 MED ORDER — LONHALA MAGNAIR REFILL KIT 25 MCG/ML IN SOLN: 1.0000 | Freq: Two times a day (BID) | RESPIRATORY_TRACT | 11 refills | Status: DC

## 2019-11-03 NOTE — Patient Instructions (Addendum)
Thank you for visiting Dr. Carlis Abbott at [facility]. We recommend the following:  Use Lonhala nebulizer two times daily no matter how good or bad you feel.  Meds ordered this encounter  Medications  . Glycopyrrolate (LONHALA MAGNAIR REFILL KIT) 25 MCG/ML SOLN    Sig: Inhale 1 Dose into the lungs 2 (two) times daily.    Dispense:  60 mL    Refill:  11  . albuterol (PROVENTIL) (2.5 MG/3ML) 0.083% nebulizer solution    Sig: Take 3 mLs (2.5 mg total) by nebulization every 4 (four) hours as needed for wheezing or shortness of breath.    Dispense:  75 mL    Refill:  11  . Glycopyrrolate (LONHALA MAGNAIR STARTER KIT) 25 MCG/ML SOLN    Sig: Inhale 1 each into the lungs in the morning and at bedtime.    Dispense:  60 mL    Refill:  0    Return in about 2 months (around 01/03/2020).    Please do your part to reduce the spread of COVID-19.

## 2019-11-03 NOTE — Progress Notes (Signed)
Synopsis: Referred in April 2021 for shortness of breath by Arnoldo Lenis, MD.  Subjective:   PATIENT ID: Jorge Lester GENDER: male DOB: 09-02-1933, MRN: [id number]  Chief Complaint  Patient presents with  . Consult    Patient has shortness of breath that is worse with exertion. Patient has dry cough at night sometimes.    Jorge Lester is an 86 y/o gentleman with a history of Parkinson's disease.  He is accompanied to today's visit by his daughter.  He has a history of COPD and was previously prescribed Spiriva but stopped using it due to no perceived benefit.  His daughter feels that he had difficulty coordinating use of this inhaler.  He complains of shortness of breath whenever he is not sitting still.  He has no worsening of his symptoms when he is supine.  He denies sputum production or wheezing, but has an occasional dry cough at night.  He has not been using albuterol.  He quit smoking 35 years ago after 35 years x 2 pack/day.  He has been less active for the last year due to shortness of breath and has had decreased activity over the past few years due to his Parkinson's, but several years ago was still able to walk 3 miles per day.  He has had a few episodes of dysphagia with choking, but these have been infrequent and spaced out.  His family is cautious to cut his food into small bites, especially with meat.  He has never been evaluated by SLP.  He has had falls in the past and his neurologist most recently noted worsening balance issues.  He has had reduced appetite and has lost about 5 pounds over the past year.     Past Medical History:  Diagnosis Date  . Anemia   . CAD (coronary artery disease)    a. CTO of LAD known since 2008, s/p DES x 2 to CTO 01/01/13. b. known residual nonobstructive disease in LCx, RCA.  [name] COPD (chronic obstructive pulmonary disease) ([place])   . GERD (gastroesophageal reflux disease)   . Gout   . Hyperlipidemia   . Hypertension   . Mitral  regurgitation    Prev mild, most recently trivial by echo 11/2012.  . Orthostatic hypotension 06/04/2017  . Parkinson's disease   . Sleep apnea      Family History  Problem Relation Age of Onset  . Ovarian cancer Mother   . Coronary artery disease Father   . Alzheimer's disease Father   . Diabetes Brother   . Diabetes Other   . Transient ischemic attack Other      Past Surgical History:  Procedure Laterality Date  . CARDIAC CATHETERIZATION  2008  . CORONARY ANGIOPLASTY WITH STENT PLACEMENT  01/01/2013   "2" (01/01/2013)  . [place]  . PERCUTANEOUS CORONARY STENT INTERVENTION (PCI-S) N/A 01/01/2013   Procedure: PERCUTANEOUS CORONARY STENT INTERVENTION (PCI-S);  Surgeon: Peter M Martinique, MD;  Location: [facility] CATH LAB;  Service: Cardiovascular;  Laterality: N/A;  . TONSILLECTOMY AND ADENOIDECTOMY  1947  . TRIGGER FINGER RELEASE  1980's  . VASECTOMY      Social History   Socioeconomic History  . Marital status: Married    Spouse name: Not on file  . Number of children: 1  . Years of education: 3  . Highest education level: Not on file  Occupational History  . Occupation: Retired  Tobacco Use  . Smoking status: Former Smoker  Packs/day: 2.00    Years: 44.00    Pack years: 88.00    Types: Cigarettes    Start date: 07/16/1945    Quit date: 07/16/1989    Years since quitting: 30.3  . Smokeless tobacco: Never Used  Substance and Sexual Activity  . Alcohol use: No    Alcohol/week: 0.0 standard drinks    Comment: 01/01/2013 "last alcohol was 1983"  . Drug use: No  . Sexual activity: Yes  Other Topics Concern  . Not on file  Social History Narrative   Lives at home alone   Patient is right handed.   Patient drinks 1 cup of caffeine per day.   Social Determinants of Health   Financial Resource Strain:   . Difficulty of Paying Living Expenses:   Food Insecurity:   . Worried About Charity fundraiser in the Last Year:   . Arboriculturist in the Last Year:    Transportation Needs:   . Film/video editor (Medical):   [name] Lack of Transportation (Non-Medical):   Physical Activity:   . Days of Exercise per Week:   . Minutes of Exercise per Session:   Stress:   . Feeling of Stress :   Social Connections:   . Frequency of Communication with Friends and Family:   . Frequency of Social Gatherings with Friends and Family:   . Attends Religious Services:   . Active Member of Clubs or Organizations:   . Attends Archivist Meetings:   [name] Marital Status:   Intimate Partner Violence:   . Fear of Current or Ex-Partner:   . Emotionally Abused:   [name] Physically Abused:   . Sexually Abused:      Allergies  Allergen Reactions  . Amantadines     Hallucinations  . Sulfonamide Derivatives      Immunization History  Administered Date(s) Administered  . Influenza-Unspecified 05/13/2012, 05/27/2014, 04/26/2015  . Pneumococcal Conjugate-13 01/06/2019  . Pneumococcal-Unspecified 03/17/2011    Outpatient Medications Prior to Visit  Medication Sig Dispense Refill  . acetaminophen (TYLENOL 8 HOUR) 650 MG CR tablet Take 1,300 mg by mouth every morning.    [name] aspirin 81 MG tablet Take 81 mg by mouth daily. Reported on 01/24/2016    . carbidopa-levodopa (SINEMET CR) 50-200 MG tablet TAKE 1/2 TABLET BY MOUTH EVERY MORNING and TAKE 1/2 TABLET IN THE AFTERNOON and TAKE 1 TABLET BY MOUTH EVERY EVENING 60 tablet 11  . carbidopa-levodopa (SINEMET IR) 25-100 MG tablet TAKE 1 TABLET BY MOUTH EVERY MORNING, AT NOON AND TAKE 1/2 TABLET BY MOUTH EVERY EVENING 225 tablet 1  . doxazosin (CARDURA) 2 MG tablet Take 2 mg by mouth at bedtime.      . famotidine (PEPCID) 20 MG tablet TAKE (1) TABLET TWICE DAILY. 60 tablet 3  . famotidine (PEPCID) 20 MG tablet TAKE 1 TABLET BY MOUTH TWICE DAILY    . mirtazapine (REMERON) 15 MG tablet TAKE 1 TABLET BY MOUTH AT BEDTIME 90 tablet 1  . nitroGLYCERIN (NITROSTAT) 0.4 MG SL tablet Place 1 tablet (0.4 mg total) under the  tongue every 5 (five) minutes as needed. 25 tablet 3  . QUEtiapine (SEROQUEL) 25 MG tablet TAKE 1 TABLET BY MOUTH AT BEDTIME 90 tablet 3  . ropinirole (REQUIP) 5 MG tablet TAKE 1 TABLET BY MOUTH BEFORE bedtime 30 tablet 11  . simvastatin (ZOCOR) 40 MG tablet Take 1 tablet by mouth daily. Changed by Dr. Scotty Court     No facility-administered medications prior  to visit.    Review of Systems  Constitutional: Positive for weight loss.       Reduced appetite  HENT: Negative for congestion.   Respiratory: Positive for cough and shortness of breath. Negative for sputum production and wheezing.   Cardiovascular: Negative for chest pain and leg swelling.  Gastrointestinal: Negative.   Musculoskeletal: Negative.   Neurological: Positive for weakness.       Tremors, instability  Endo/Heme/Allergies: Negative for environmental allergies.     Objective:   Vitals:   11/03/19 1040  BP: 140/80  Pulse: 99  Temp: 98 F (36.7 C)  TempSrc: Temporal  SpO2: 96%  Weight: 147 lb (66.7 kg)  Height: 5' 10.08" (1.78 m)   96% on   RA BMI Readings from Last 3 Encounters:  11/03/19 21.04 kg/m  09/28/19 21.38 kg/m  03/26/19 21.95 kg/m   Wt Readings from Last 3 Encounters:  11/03/19 147 lb (66.7 kg)  09/28/19 149 lb (67.6 kg)  03/26/19 153 lb (69.4 kg)    Physical Exam Vitals reviewed.  Constitutional:      Appearance: He is not ill-appearing.     Comments: Thin, frail appearing man  HENT:     Head: Normocephalic and atraumatic.  Eyes:     General: No scleral icterus. Cardiovascular:     Rate and Rhythm: Normal rate and regular rhythm.     Heart sounds: No murmur.  Pulmonary:     Comments: Breathing comfortably on room air, no conversational dyspnea.  Clear to auscultation bilaterally. Abdominal:     General: There is no distension.     Palpations: Abdomen is soft.     Tenderness: There is no abdominal tenderness.  Musculoskeletal:        General: No swelling.     Cervical back:  Neck supple.     Comments: Mild kyphosis  Lymphadenopathy:     Cervical: No cervical adenopathy.  Skin:    General: Skin is warm and dry.     Findings: No rash.  Neurological:     Mental Status: He is alert.     Comments: Resting tremor in his jaw, frequent choreiform movements.  Slightly delayed responses.  Psychiatric:        Mood and Affect: Mood normal.        Behavior: Behavior normal.      CBC    Component Value Date/Time   WBC 7.3 01/02/2013 0523   RBC 4.07 (L) 01/02/2013 0523   HGB 12.0 (L) 01/02/2013 0523   HCT 35.9 (L) 01/02/2013 0523   PLT 146 (L) 01/02/2013 0523   MCV 88.2 01/02/2013 0523   MCH 29.5 01/02/2013 0523   MCHC 33.4 01/02/2013 0523   RDW 13.7 01/02/2013 0523    CHEMISTRY No results for input(s): NA, K, CL, CO2, GLUCOSE, BUN, CREATININE, CALCIUM, MG, PHOS in the last 168 hours. CrCl cannot be calculated (Patient's most recent lab result is older than the maximum 21 days allowed.).   Chest Imaging- films reviewed: CXR, 2 view 01/08/2019- hyperinflation, increased lung markings bilaterally, airway thickening.  Mild kyphosis.  Pulmonary Functions Testing Results: PFT Results Latest Ref Rng & Units 08/22/2016  FVC-Pre L 3.64  FVC-Predicted Pre % 83  FVC-Post L 3.58  FVC-Predicted Post % 82  Pre FEV1/FVC % % 66  Post FEV1/FCV % % 68  FEV1-Pre L 2.41  FEV1-Predicted Pre % 77  FEV1-Post L 2.44  DLCO UNC% % 46  DLCO COR %Predicted % 55  TLC L 6.52  TLC % Predicted % 87  RV % Predicted % 108   2018-moderate obstruction without significant bronchodilator reversibility.  No significant air trapping or hyperinflation.  Moderately impaired diffusion.  Flow volume loop supports obstruction.  Echocardiogram 01/15/2019: LVEF 55 to 60%, mild LVH.  Diastolic dysfunction is present.  Normal LA, RV, RA.  Mild AI, trivial MR and TR.  Normal-sized IVC with normal respiratory variability.      Assessment & Plan:     ICD-10-CM   1. Chronic obstructive  pulmonary disease, unspecified COPD type ([place])  J44.9 Ambulatory Referral for DME  2. DOE (dyspnea on exertion)  R06.00   3. Dysphagia, unspecified type  R13.10     Dyspnea on exertion due to COPD, remote history of tobacco abuse -Recommend starting LAMA nebulizer.  His insurance covers Lonhala BID.  I agree with his daughter that he is likely to have difficulty coordinating and sealing his mouth around an inhaler. -Nebulized albuterol every 4 hours as needed for breakthrough symptoms -Recommend Covid and flu vaccines.  We discussed the risk of more rapid deterioration in lung function associated with exacerbations, which are frequently associated with viral illnesses.  He is not interested in getting the Covid vaccine.  Up-to-date on pneumonia vaccines -Outside the recommended age for lung cancer screening  Dysphagia, likely a complication of Parkinson's -Discussed that he may require SLP evaluation and dietary modifications.  Will refer to SLP if he has more frequent or noticeable issues with dysphagia.  RTC in 2 to 3 months.    Current Outpatient Medications:  .  acetaminophen (TYLENOL 8 HOUR) 650 MG CR tablet, Take 1,300 mg by mouth every morning., Disp: , Rfl:  .  aspirin 81 MG tablet, Take 81 mg by mouth daily. Reported on 01/24/2016, Disp: , Rfl:  .  carbidopa-levodopa (SINEMET CR) 50-200 MG tablet, TAKE 1/2 TABLET BY MOUTH EVERY MORNING and TAKE 1/2 TABLET IN THE AFTERNOON and TAKE 1 TABLET BY MOUTH EVERY EVENING, Disp: 60 tablet, Rfl: 11 .  carbidopa-levodopa (SINEMET IR) 25-100 MG tablet, TAKE 1 TABLET BY MOUTH EVERY MORNING, AT NOON AND TAKE 1/2 TABLET BY MOUTH EVERY EVENING, Disp: 225 tablet, Rfl: 1 .  doxazosin (CARDURA) 2 MG tablet, Take 2 mg by mouth at bedtime.  , Disp: , Rfl:  .  famotidine (PEPCID) 20 MG tablet, TAKE (1) TABLET TWICE DAILY., Disp: 60 tablet, Rfl: 3 .  famotidine (PEPCID) 20 MG tablet, TAKE 1 TABLET BY MOUTH TWICE DAILY, Disp: , Rfl:  .  mirtazapine  (REMERON) 15 MG tablet, TAKE 1 TABLET BY MOUTH AT BEDTIME, Disp: 90 tablet, Rfl: 1 .  nitroGLYCERIN (NITROSTAT) 0.4 MG SL tablet, Place 1 tablet (0.4 mg total) under the tongue every 5 (five) minutes as needed., Disp: 25 tablet, Rfl: 3 .  QUEtiapine (SEROQUEL) 25 MG tablet, TAKE 1 TABLET BY MOUTH AT BEDTIME, Disp: 90 tablet, Rfl: 3 .  ropinirole (REQUIP) 5 MG tablet, TAKE 1 TABLET BY MOUTH BEFORE bedtime, Disp: 30 tablet, Rfl: 11 .  simvastatin (ZOCOR) 40 MG tablet, Take 1 tablet by mouth daily. Changed by Dr. Scotty Court, Disp: , Rfl:  .  albuterol (PROVENTIL) (2.5 MG/3ML) 0.083% nebulizer solution, Take 3 mLs (2.5 mg total) by nebulization every 4 (four) hours as needed for wheezing or shortness of breath., Disp: 75 mL, Rfl: 11 .  Glycopyrrolate (LONHALA MAGNAIR REFILL KIT) 25 MCG/ML SOLN, Inhale 1 Dose into the lungs 2 (two) times daily., Disp: 60 mL, Rfl: 11 .  Glycopyrrolate ([place]  KIT) 25 MCG/ML SOLN, Inhale 1 each into the lungs in the morning and at bedtime., Disp: 60 mL, Rfl: 0     Julian Hy, DO [facility] 11/03/2019 12:01 PM

## 2019-11-05 ENCOUNTER — Telehealth: Payer: Self-pay | Admitting: Critical Care Medicine

## 2019-11-05 DIAGNOSIS — J449 Chronic obstructive pulmonary disease, unspecified: Secondary | ICD-10-CM

## 2019-11-05 NOTE — Telephone Encounter (Signed)
I spoke with Gavin Pound and she states the [facility] has a 400 co-pay and they cannot afford this. She would like to know if there are any alternatives? Please advise. They did get the Rx for albuterol nebulizers.

## 2019-11-05 NOTE — Telephone Encounter (Signed)
See if Jorge Lester is covered please. Epic said the [place] was preferred over [place], but maybe not.  Thanks!

## 2019-11-09 ENCOUNTER — Telehealth: Payer: Self-pay | Admitting: Critical Care Medicine

## 2019-11-09 MED ORDER — LONHALA MAGNAIR STARTER KIT 25 MCG/ML IN SOLN: 1.0000 | Freq: Two times a day (BID) | RESPIRATORY_TRACT | 0 refills | Status: DC

## 2019-11-09 MED ORDER — LONHALA MAGNAIR REFILL KIT 25 MCG/ML IN SOLN: 1.0000 | Freq: Two times a day (BID) | RESPIRATORY_TRACT | 11 refills | Status: DC

## 2019-11-09 NOTE — Telephone Encounter (Signed)
Dr. Chestine Spore, please advise what you want [place] to do in regards to this as when we first sent the rx to pt's local pharmacy based on previous encounter, med was going to cost him about $400 which he could not afford. Now we find out that DME pharmacy does not have this med.

## 2019-11-09 NOTE — Telephone Encounter (Signed)
Typically nebs for Medicare patients need to be sent to a DME company so that it can be filed under Medicare Part B - retail pharmacies (for the most part) will file under Part D which makes the copay overly expensive.  Called [facility] to see which Medicare Part the [place] Charon was filed under - spoke with [place] who reported it was indeed filed under Medicare Part D.  Called spoke with patient's daughter Gavin Pound and explained the above.  We can send the Yupelri like Dr Chestine Spore recommended but if we send it to [facility], we may end up getting the same outcome or we can try Dr Ophelia Charter 1st recommendation and try sending it to a DME.  Gavin Pound would like to try the Lonhala to a DME and if that is still too expensive, we have the alternative Yupelri that Dr Chestine Spore authorized.  Order placed.  Will sign and route to Dr Chestine Spore to let her know the update.

## 2019-11-09 NOTE — Telephone Encounter (Signed)
Is this the yuupelri? We aren't doing the lonhala that was previously prescribed.  LPC

## 2019-11-09 NOTE — Telephone Encounter (Signed)
Thanks for taking care of that!  LPC

## 2019-11-09 NOTE — Telephone Encounter (Signed)
Pt's daughter Farris Has calling back regarding nebulizer medication.  [phone]

## 2019-11-09 NOTE — Telephone Encounter (Signed)
Conversation with [facility] revealed that an order was sent to [facility] but they do not have a pharmacy, they only supplied patient with the neb machine and supplies.  DME order cancelled.  Rx redone and sent [facility]

## 2019-11-10 MED ORDER — YUPELRI 175 MCG/3ML IN SOLN: 175.0000 ug | Freq: Every day | RESPIRATORY_TRACT | 11 refills | Status: DC

## 2019-11-10 NOTE — Telephone Encounter (Signed)
Looking back I think Shanda Bumps said in one of her notes that she was going to try the lonhala again, and if that didn't work then try the yupelri. I am guessing it hasn't been prescribed yet which is why you don't see it on the med list, but since that was the plan she and I had discussed would you be able to please prescribe that for him as the next step? Thanks!  LPC

## 2019-11-10 NOTE — Telephone Encounter (Signed)
Called pt to let him know that the previous [place] Rams med that Dr. Chestine Spore was trying to start him on could not be gotten at DME as they do not carry that med. Stated to him since it was too expensive at his local pharmacy, we were going to switch him to Yupelri neb sol and pt verbalized understanding.  Asked pt if he wanted the Yupelri to go to local pharmacy or DME and pt stated to send it to local pharmacy. Stated to him if med was too expensive at local pharmacy to call us and we would then send it to DME for him and pt verbalized understanding. Verified pt's preferred pharmacy and sent med in for pt. Nothing further needed.

## 2019-11-10 NOTE — Telephone Encounter (Signed)
I am not seeing Yupelri on pt's medication list, just the lonhala magnair starter kit and the lonhala magnair refill kit. It does not look like an rx for Maretta Bees was sent in.

## 2019-11-11 ENCOUNTER — Telehealth: Payer: Self-pay | Admitting: Critical Care Medicine

## 2019-11-11 MED ORDER — YUPELRI 175 MCG/3ML IN SOLN: 175.0000 ug | Freq: Every day | RESPIRATORY_TRACT | 11 refills | Status: DC

## 2019-11-11 NOTE — Telephone Encounter (Signed)
Called and spoke with patient and states that both inhaler meds we have prescribed are to expensive. Looks like we first prescribed Lonhala and then switched it to [place].  Looks like message from [place] on 4/22-Rx for Lonhala redone and sent to [facility]. They dont carry that medication. I sent RX for Yupelri to that DME pharmacy we will see if they carry that medication.

## 2019-11-13 ENCOUNTER — Telehealth: Payer: Self-pay | Admitting: Critical Care Medicine

## 2019-11-13 NOTE — Telephone Encounter (Signed)
Medication name and strength: Yupelri 154mcg/3ml Provider: Chestine Spore [facility] Patient insurance ID: [id number] Phone: [phone] Fax: [phone]  Was the PA started on CMM?  yes If yes, please enter the Key: [id number] Timeframe for approval/denial: [facility] is reviewing your PA request. Typically an electronic response will be received within 72 hours. To check for an update later, open this request from your dashboard.  Routing to [place] to follow up on.

## 2019-11-17 NOTE — Telephone Encounter (Signed)
Update from CMM:  N/A on April 30 This medication or product is on your plan's list of covered drugs. Prior authorization is not required at this time. If your pharmacy has questions regarding the processing of your prescription, please have them call the [facility] pharmacy help desk at [phone]. **Please note: Formulary lowering, tiering exception, cost reduction and prospective Medicare hospice reviews cannot be requested using this method of submission. Please contact us at [phone] instead.  Called and spoke with patient. He states that he was called by pharmacy and was told that he would have to pay $241 for his copay which is less than the other pharmacy that was $400 but he still can't afford anything over $100. Will send this to the pharmacy team to see if they have any suggestions.

## 2019-11-18 ENCOUNTER — Encounter: Payer: Self-pay | Admitting: Pharmacist

## 2019-11-18 NOTE — Telephone Encounter (Signed)
I recommend trying Stiolto once daily. If this is not an option, Striverdi or Spiriva respimat are also options. What LAMA or LAMA/LABA HFA options are there? I am not sure which have this carrier molecule in them.  Steffanie Dunn, DO 11/18/19 3:09 PM [facility]

## 2019-11-18 NOTE — Telephone Encounter (Signed)
Unfortunately the patient has Medicare Advantage plan so unable to bill Medicare Part B medical benefit.  There are no grants or patient assistance for nebulized medication.  The next best option would be switching to [facility] with spacer and/or respimat inhaler.   Verlin Fester, PharmD, [place], CPP Clinical Specialty Pharmacist [phone]  11/18/2019 2:41 PM

## 2019-11-18 NOTE — Telephone Encounter (Signed)
Please advise. Thanks.  

## 2019-11-18 NOTE — Progress Notes (Signed)
error 

## 2019-11-19 NOTE — Telephone Encounter (Signed)
That was an error.  Think meant to put MDI.  Thanks!

## 2019-11-23 NOTE — Telephone Encounter (Signed)
Yes, please. Thanks!  LPC

## 2019-11-23 NOTE — Telephone Encounter (Signed)
Dr. Chestine Spore would you like me call patient and see if he is ok switching to Stiolto? If so I will send it in to pharmacy.

## 2019-11-27 MED ORDER — STIOLTO RESPIMAT 2.5-2.5 MCG/ACT IN AERS
2.0000 | INHALATION_SPRAY | Freq: Every day | RESPIRATORY_TRACT | 6 refills | Status: DC
Start: 2019-11-27 — End: 2020-02-02

## 2019-11-27 NOTE — Telephone Encounter (Signed)
Called and spoke with patient and he is willing to switch to [employer]. RX has been sent to [facility] and he will keep follow up appt in a month so that we can see if inhaler is making a difference. Nothing further needed at this time.

## 2019-12-02 ENCOUNTER — Other Ambulatory Visit: Payer: Self-pay

## 2019-12-02 ENCOUNTER — Ambulatory Visit: Payer: Medicare Other | Admitting: Family Medicine

## 2019-12-02 ENCOUNTER — Encounter: Payer: Self-pay | Admitting: Family Medicine

## 2019-12-02 VITALS — BP 136/80 | HR 92 | Ht 70.0 in | Wt 145.8 lb

## 2019-12-02 DIAGNOSIS — R0602 Shortness of breath: Secondary | ICD-10-CM | POA: Diagnosis not present

## 2019-12-02 DIAGNOSIS — I2510 Atherosclerotic heart disease of native coronary artery without angina pectoris: Secondary | ICD-10-CM

## 2019-12-02 DIAGNOSIS — E782 Mixed hyperlipidemia: Secondary | ICD-10-CM

## 2019-12-02 NOTE — Patient Instructions (Addendum)
Medication Instructions:    Your physician recommends that you continue on your current medications as directed. Please refer to the Current Medication list given to you today.  Labwork:  NONE  Testing/Procedures:  NONE  Follow-Up:  Your physician recommends that you schedule a follow-up appointment in: 6 months (office)  Any Other Special Instructions Will Be Listed Below (If Applicable).  If you need a refill on your cardiac medications before your next appointment, please call your pharmacy. 

## 2019-12-02 NOTE — Progress Notes (Signed)
Cardiology Office Note  Date: 12/02/2019   ID: Jorge Lester, DOB 1934-05-18, MRN [id number]  PCP:  Suzan Slick, MD  Cardiologist:  Dina Rich, MD Electrophysiologist:  None   Chief Complaint: Follow-up shortness of breath, CAD, COPD, Parkinson's disease  History of Present Illness: Jorge Lester is a 86 y.o. male with a history of shortness of breath, CAD, COPD, Parkinson's disease  Last encounter with Dr. Wyline Mood on 9 03/17/2019 patient was on medical therapy limited by soft blood pressures and orthostatic symptoms related to his coronary artery disease.  He denied any chest pain at last visit.  Has chronic shortness of breath all the time worse with activity.  He previously stopped his inhalers stating he felt they did not help.  COPD with abnormal PFTs in 2018 as mentioned above he stopped his Spiriva.  Followed by neuro for his Parkinson's.  Patient here for 73-month follow-up.  Patient denies any recent acute illnesses, hospitalizations, or surgeries.  Denies any exposure to the Covid virus.  Has not had the vaccine.  He states he has chronic shortness of breath and has been told he has COPD.  He has Parkinson's disease and sees neurology.  He denies any anginal symptoms, palpitations or arrhythmias, orthostatic symptoms, stroke or TIA-like symptoms, bleeding issues, claudication-like symptoms, DVT or PE-like symptoms, or lower extremity edema.  States he is due for his annual wellness visit soon with his primary care provider.   Past Medical History:  Diagnosis Date  . Anemia   . CAD (coronary artery disease)    a. CTO of LAD known since 2008, s/p DES x 2 to CTO 01/01/13. b. known residual nonobstructive disease in LCx, RCA.  [name] COPD (chronic obstructive pulmonary disease) (HCC)   . GERD (gastroesophageal reflux disease)   . Gout   . Hyperlipidemia   . Hypertension   . Mitral regurgitation    Prev mild, most recently trivial by echo 11/2012.  . Orthostatic  hypotension 06/04/2017  . Parkinson's disease   . Sleep apnea     Past Surgical History:  Procedure Laterality Date  . CARDIAC CATHETERIZATION  2008  . CORONARY ANGIOPLASTY WITH STENT PLACEMENT  01/01/2013   "2" (01/01/2013)  . LUMBAR DISC SURGERY  1974  . PERCUTANEOUS CORONARY STENT INTERVENTION (PCI-S) N/A 01/01/2013   Procedure: PERCUTANEOUS CORONARY STENT INTERVENTION (PCI-S);  Surgeon: Peter M Swaziland, MD;  Location: [facility] CATH LAB;  Service: Cardiovascular;  Laterality: N/A;  . TONSILLECTOMY AND ADENOIDECTOMY  1947  . TRIGGER FINGER RELEASE  1980's  . VASECTOMY      Current Outpatient Medications  Medication Sig Dispense Refill  . acetaminophen (TYLENOL 8 HOUR) 650 MG CR tablet Take 1,300 mg by mouth every morning.    [name] albuterol (PROVENTIL) (2.5 MG/3ML) 0.083% nebulizer solution Take 3 mLs (2.5 mg total) by nebulization every 4 (four) hours as needed for wheezing or shortness of breath. 75 mL 11  . aspirin 81 MG tablet Take 81 mg by mouth daily. Reported on 01/24/2016    . carbidopa-levodopa (SINEMET CR) 50-200 MG tablet TAKE 1/2 TABLET BY MOUTH EVERY MORNING and TAKE 1/2 TABLET IN THE AFTERNOON and TAKE 1 TABLET BY MOUTH EVERY EVENING 60 tablet 11  . carbidopa-levodopa (SINEMET IR) 25-100 MG tablet TAKE 1 TABLET BY MOUTH EVERY MORNING, AT NOON AND TAKE 1/2 TABLET BY MOUTH EVERY EVENING 225 tablet 1  . doxazosin (CARDURA) 2 MG tablet Take 2 mg by mouth at bedtime.      [name]  famotidine (PEPCID) 20 MG tablet TAKE (1) TABLET TWICE DAILY. 60 tablet 3  . famotidine (PEPCID) 20 MG tablet TAKE 1 TABLET BY MOUTH TWICE DAILY    . mirtazapine (REMERON) 15 MG tablet TAKE 1 TABLET BY MOUTH AT BEDTIME 90 tablet 1  . nitroGLYCERIN (NITROSTAT) 0.4 MG SL tablet Place 1 tablet (0.4 mg total) under the tongue every 5 (five) minutes as needed. 25 tablet 3  . QUEtiapine (SEROQUEL) 25 MG tablet TAKE 1 TABLET BY MOUTH AT BEDTIME 90 tablet 3  . revefenacin (YUPELRI) 175 MCG/3ML nebulizer solution Take 3 mLs (175  mcg total) by nebulization daily. 90 mL 11  . ropinirole (REQUIP) 5 MG tablet TAKE 1 TABLET BY MOUTH BEFORE bedtime 30 tablet 11  . simvastatin (ZOCOR) 40 MG tablet Take 1 tablet by mouth daily. Changed by Dr. Margo Common    . Tiotropium Bromide-Olodaterol (STIOLTO RESPIMAT) 2.5-2.5 MCG/ACT AERS Inhale 2 puffs into the lungs daily. 4 g 6   No current facility-administered medications for this visit.   Allergies:  Amantadines and Sulfonamide derivatives   Social History: The patient  reports that he quit smoking about 30 years ago. His smoking use included cigarettes. He started smoking about 74 years ago. He has a 88.00 pack-year smoking history. He has never used smokeless tobacco. He reports that he does not drink alcohol or use drugs.   Family History: The patient's family history includes Alzheimer's disease in his father; Coronary artery disease in his father; Diabetes in his brother and another family member; Ovarian cancer in his mother; Transient ischemic attack in an other family member.   ROS:  Please see the history of present illness. Otherwise, complete review of systems is positive for none.  All other systems are reviewed and negative.   Physical Exam: VS:  BP 136/80   Pulse 92   Ht 5\' 10"  (1.778 m)   Wt 145 lb 12.8 oz (66.1 kg)   SpO2 99%   BMI 20.92 kg/m , BMI Body mass index is 20.92 kg/m.  Wt Readings from Last 3 Encounters:  12/02/19 145 lb 12.8 oz (66.1 kg)  11/03/19 147 lb (66.7 kg)  09/28/19 149 lb (67.6 kg)    General: Patient appears comfortable at rest. Neck: Supple, no elevated JVP or carotid bruits, no thyromegaly. Lungs: Clear to auscultation, nonlabored breathing at rest. Cardiac: Regular rate and rhythm, no S3 or significant systolic murmur, no pericardial rub. Extremities: No pitting edema, distal pulses 2+. Skin: Warm and dry. Musculoskeletal: No kyphosis. Neuropsychiatric: Alert and oriented x3, affect grossly appropriate.  ECG:  An ECG dated  01/08/2019 was personally reviewed today and demonstrated:  Sinus rhythm 92 incomplete right bundle branch block  Recent Labwork: No results found for requested labs within last 8760 hours.  No results found for: CHOL, TRIG, HDL, CHOLHDL, VLDL, LDLCALC, LDLDIRECT  Other Studies Reviewed Today:  Echocardiogram 01/15/2019 IMPRESSIONS  1. The left ventricle has normal systolic function, with an ejection  fraction of 55-60%. The cavity size was normal. There is mildly increased  left ventricular wall thickness. Left ventricular diastolic Doppler  parameters are consistent with impaired  relaxation. No evidence of left ventricular regional wall motion  abnormalities.  2. The right ventricle has normal systolic function. The cavity was  normal. There is no increase in right ventricular wall thickness. Right  ventricular systolic pressure could not be assessed.  3. The aortic valve is tricuspid. Mild calcification of the aortic valve.  Aortic valve regurgitation is mild by color  flow Doppler. Mild aortic  annular calcification noted.  4. The mitral valve is grossly normal. There is mild mitral annular  calcification present.  5. The tricuspid valve is grossly normal.  6. The aortic root is normal in size and structure  Assessment and Plan:  1. Coronary artery disease involving native coronary artery of native heart without angina pectoris   2. Mixed hyperlipidemia   3. SOB (shortness of breath)   4. Essential hypertension    1. Coronary artery disease involving native coronary artery of native heart without angina pectoris Patient denies any progressive anginal symptoms.  Continue aspirin 81 mg, nitroglycerin 0.4 mg sublingual as needed for chest pain.  2. Mixed hyperlipidemia Continue Zocor 40 mg daily.  Patient has a pending wellness follow-up with PCP and will have lipids drawn at that time   3. SOB (shortness of breath) Patient has chronic shortness of breath.  States he has  been told he has COPD.  He states that there have been no changes in his breathing status since last visit.  He states his primary care provider recently placed him on a new breathing medication. He cannot afford the Spiriva.  He apparently takes Paramedic inhaler and Yupelri nebulizer solution per medication record.    Medication Adjustments/Labs and Tests Ordered: Current medicines are reviewed at length with the patient today.  Concerns regarding medicines are outlined above.   Disposition: Follow-up with Dr. Harl Bowie or APP 6 months  Signed, Levell July, NP 12/02/2019 4:11 PM    [facility], [place], [place] [address] Phone: [phone]; Fax: [phone]

## 2019-12-03 ENCOUNTER — Other Ambulatory Visit: Payer: Self-pay | Admitting: Neurology

## 2019-12-26 ENCOUNTER — Other Ambulatory Visit: Payer: Self-pay | Admitting: Neurology

## 2019-12-30 ENCOUNTER — Ambulatory Visit: Payer: Medicare Other | Admitting: Critical Care Medicine

## 2020-01-13 ENCOUNTER — Ambulatory Visit: Payer: Medicare Other | Admitting: Critical Care Medicine

## 2020-01-14 ENCOUNTER — Ambulatory Visit: Payer: Medicare Other | Admitting: Critical Care Medicine

## 2020-01-15 ENCOUNTER — Encounter: Payer: Self-pay | Admitting: Acute Care

## 2020-01-15 ENCOUNTER — Ambulatory Visit: Payer: Medicare Other | Admitting: Acute Care

## 2020-01-15 ENCOUNTER — Other Ambulatory Visit: Payer: Self-pay

## 2020-01-15 VITALS — BP 132/70 | HR 99 | Temp 97.8°F | Ht 70.0 in | Wt 143.6 lb

## 2020-01-15 DIAGNOSIS — R0600 Dyspnea, unspecified: Secondary | ICD-10-CM | POA: Diagnosis not present

## 2020-01-15 DIAGNOSIS — R634 Abnormal weight loss: Secondary | ICD-10-CM | POA: Diagnosis not present

## 2020-01-15 DIAGNOSIS — G20 Parkinson's disease: Secondary | ICD-10-CM

## 2020-01-15 DIAGNOSIS — R0609 Other forms of dyspnea: Secondary | ICD-10-CM

## 2020-01-15 DIAGNOSIS — R1310 Dysphagia, unspecified: Secondary | ICD-10-CM | POA: Diagnosis not present

## 2020-01-15 MED ORDER — ALBUTEROL SULFATE HFA 108 (90 BASE) MCG/ACT IN AERS
2.0000 | INHALATION_SPRAY | RESPIRATORY_TRACT | 5 refills | Status: DC | PRN
Start: 2020-01-15 — End: 2022-12-06

## 2020-01-15 NOTE — Progress Notes (Signed)
History of Present Illness Jorge Lester is a 86 y.o. male with history of COPD, Parkinson's Disease referred for shortness of breath with exertion. Former smoker, 88 pack year smoking history, quit 35 years ago. He was referred for shortness of breath with exertion. He is followed by Dr. Chestine Sporelark.  Seen by Chestine Sporelark 4.20/2021 Recommended LAMA inhaler Difficult to co-ordinatte inhalers Albuterol nebs every 4 prn breakthrough  Swallow eval   01/15/2020  Pt. Presents for follow up with his daughter.He was seen by Dr. Chestine Sporelark for Shortness of breath with exertion on 11/03/2019. [name]. She recommended a LAMA inhaler. Because he has difficulty co-ordinating his inhlaer use due to his Parkinson's, he was started on Yupelri nebs, and albuterol nebs for rescue. [name]. He was unable to afford  The Yupelri, even with the financial assistance. ( It was still > $450 per month). The decision was them made to start Stialto.   He states he has been compliant with Stialto.He feels his breathing is much better with the medication .    Pt. States he is doing well on the inhaler. He feels like he is able to co-ordinate adequately to use the Stialto inhaler.  Pt. turned in his nebulizer machine, so he does not have access to his rescue nebs. We will prescribe a rescue inhlaer and use a spacer for administration. We will educate him on use. He denies any wheezing. No fever.No significant cough of sputum production. We did discuss using a spacer with his maintenance inhaler. I explained that the  Spacer is not used with Stialto, as the device is different. He may be interested in switching [facility] if he feels he has an easier time using the spacer for medication administration. I told him we will re-evaluate at follow up to see how he did with the rescue inhaler with spacer.  He states he has not noted any issues with swallow. I explained that we needed to do continuous monitoring of this, as worsening Parkinson's can affect the  swallow . He is in agreement with this. No swallow eval is needed at this time.  He has had decreased activity over the last several years due to his Parkinson's Disease.He has had not had any falls since last visit. Weight is down 4 pounds since 10/2019 visit.    Test Results: 01/2019>> Echo EF 55-60%  01/08/2019 CXR Lungs are clear.  No pleural effusion or pneumothorax.  The heart is normal in size.  Visualized osseous structures are within normal limits.  IMPRESSION: Normal chest radiographs.  CBC Latest Ref Rng & Units 01/02/2013 01/01/2013  WBC 4.0 - 10.5 K/uL 7.3 6.9  Hemoglobin 13.0 - 17.0 g/dL 12.0(L) 12.6(L)  Hematocrit 39 - 52 % 35.9(L) 37.5(L)  Platelets 150 - 400 K/uL 146(L) 150    BMP Latest Ref Rng & Units 01/02/2013 01/01/2013  Glucose 70 - 99 mg/dL 409(W106(H) 119(J107(H)  BUN 6 - 23 mg/dL 20 47(W26(H)  Creatinine 2.950.50 - 1.35 mg/dL 6.211.08 3.081.09  Sodium 657135 - 145 mEq/L 138 137  Potassium 3.5 - 5.1 mEq/L 4.2 4.0  Chloride 96 - 112 mEq/L 104 103  CO2 19 - 32 mEq/L 26 26  Calcium 8.4 - 10.5 mg/dL 8.6 8.8    BNP No results found for: BNP  ProBNP No results found for: PROBNP  PFT    Component Value Date/Time   FEV1PRE 2.41 08/22/2016 0855   FEV1POST 2.44 08/22/2016 0855   FVCPRE 3.64 08/22/2016 0855   FVCPOST 3.58 08/22/2016 0855  TLC 6.52 08/22/2016 0855   DLCOUNC 16.33 08/22/2016 0855   PREFEV1FVCRT 66 08/22/2016 0855   PSTFEV1FVCRT 68 08/22/2016 0855    No results found.   Past medical hx Past Medical History:  Diagnosis Date  . Anemia   . CAD (coronary artery disease)    a. CTO of LAD known since 2008, s/p DES x 2 to CTO 01/01/13. b. known residual nonobstructive disease in LCx, RCA.  [name] COPD (chronic obstructive pulmonary disease) (HCC)   . GERD (gastroesophageal reflux disease)   . Gout   . Hyperlipidemia   . Hypertension   . Mitral regurgitation    Prev mild, most recently trivial by echo 11/2012.  . Orthostatic hypotension 06/04/2017  . Parkinson's  disease   . Sleep apnea      Social History   Tobacco Use  . Smoking status: Former Smoker    Packs/day: 2.00    Years: 44.00    Pack years: 88.00    Types: Cigarettes    Start date: 07/16/1945    Quit date: 07/16/1989    Years since quitting: 30.5  . Smokeless tobacco: Never Used  Vaping Use  . Vaping Use: Never used  Substance Use Topics  . Alcohol use: No    Alcohol/week: 0.0 standard drinks    Comment: 01/01/2013 "last alcohol was 1983"  . Drug use: No    [name] reports that he quit smoking about 30 years ago. His smoking use included cigarettes. He started smoking about 74 years ago. He has a 88.00 pack-year smoking history. He has never used smokeless tobacco. He reports that he does not drink alcohol and does not use drugs.  Tobacco Cessation: Former smoker with an 88 pack year smoking history  Past surgical hx, Family hx, Social hx all reviewed.  Current Outpatient Medications on File Prior to Visit  Medication Sig  . acetaminophen (TYLENOL 8 HOUR) 650 MG CR tablet Take 1,300 mg by mouth every morning.  [name] aspirin 81 MG tablet Take 81 mg by mouth daily. Reported on 01/24/2016  . carbidopa-levodopa (SINEMET CR) 50-200 MG tablet TAKE 1/2 TABLET BY MOUTH EVERY MORNING and TAKE 1/2 TABLET IN THE AFTERNOON and TAKE 1 TABLET BY MOUTH EVERY EVENING  . carbidopa-levodopa (SINEMET IR) 25-100 MG tablet TAKE 1 TABLET BY MOUTH EVERY MORNING, AT NOON AND TAKE 1/2 TABLET BY MOUTH EVERY EVENING  . doxazosin (CARDURA) 2 MG tablet Take 2 mg by mouth at bedtime.    . famotidine (PEPCID) 20 MG tablet TAKE (1) TABLET TWICE DAILY.  . mirtazapine (REMERON) 15 MG tablet TAKE 1 TABLET BY MOUTH AT BEDTIME  . nitroGLYCERIN (NITROSTAT) 0.4 MG SL tablet Place 1 tablet (0.4 mg total) under the tongue every 5 (five) minutes as needed.  [name] QUEtiapine (SEROQUEL) 25 MG tablet TAKE 1 TABLET BY MOUTH AT BEDTIME  . ropinirole (REQUIP) 5 MG tablet TAKE 1 TABLET BY MOUTH BEFORE bedtime  . simvastatin  (ZOCOR) 40 MG tablet Take 1 tablet by mouth daily. Changed by Dr. Margo Common  . Tiotropium Bromide-Olodaterol (STIOLTO RESPIMAT) 2.5-2.5 MCG/ACT AERS Inhale 2 puffs into the lungs daily.  [name] albuterol (PROVENTIL) (2.5 MG/3ML) 0.083% nebulizer solution Take 3 mLs (2.5 mg total) by nebulization every 4 (four) hours as needed for wheezing or shortness of breath. (Patient not taking: Reported on 01/15/2020)   No current facility-administered medications on file prior to visit.     Allergies  Allergen Reactions  . Amantadines     Hallucinations  . Sulfonamide Derivatives  Review Of Systems:  Constitutional:   No  weight loss, night sweats,  Fevers, chills, + fatigue, or  lassitude.  HEENT:   No headaches,  Difficulty swallowing,  Tooth/dental problems, or  Sore throat,                No sneezing, itching, ear ache, nasal congestion, post nasal drip, HOH  CV:  No chest pain,  Orthopnea, PND, swelling in lower extremities, anasarca, dizziness, palpitations, syncope.   GI  No heartburn, indigestion, abdominal pain, nausea, vomiting, diarrhea, change in bowel habits, loss of appetite, bloody stools.   Resp: + shortness of breath with exertion not  at rest.  No excess mucus, no productive cough,  No non-productive cough,  No coughing up of blood.  No change in color of mucus.  Rare wheezing.  No chest wall deformity  Skin: no rash or lesions.  GU: no dysuria, change in color of urine, no urgency or frequency.  No flank pain, no hematuria   MS:  No joint pain or swelling.  + decreased range of motion.  No back pain.  Psych:  No change in mood or affect. No depression or anxiety.  No memory loss.   Vital Signs BP 132/70 (BP Location: Left Arm, Cuff Size: Normal)   Pulse 99   Temp 97.8 F (36.6 C) (Oral)   Ht 5\' 10"  (1.778 m)   Wt 143 lb 9.6 oz (65.1 kg)   SpO2 98%   BMI 20.60 kg/m    Physical Exam:  General- No distress,  A&Ox3, pleasant older male in NAD ENT: No sinus tenderness,  TM clear, pale nasal mucosa, no oral exudate,no post nasal drip, no LAN Cardiac: S1, S2, regular rate and rhythm, no murmur Chest: No wheeze/ rales/ dullness; no accessory muscle use, no nasal flaring, no sternal retractions, diminished per bases, prolonged expiratory phase Abd.: Soft Non-tender, ND, BS +, Body mass index is 20.6 kg/m. Ext: No clubbing cyanosis, edema Neuro:  Physically deconditioned. MAE x 4, A&O x 3, HOH Skin: No rashes, No lesions, warm and dry Psych: normal mood and behavior   Assessment/Plan Dyspnea on exertion due to COPD, remote history of tobacco abuse Parkinsons's Disease Continue Stialto 2 puffs once daily Rinse mouth after use This is your maintenance inhaler, use every day without fail.  We will prescribe a rescue inhaler ( Albuterol)  for use as your rescue medication. Use this as needed up to every 4 hours for break through shortness of breath or wheezing We will prescribe a spacer for use with the albuterol inhaler.  We will teach you how to use this today. Please call your insurance and see what the cost of Bevespi would be. If you find the spacer easy to use with your rescue inhaler, and would like to switch your maintenance  medication to Bevespi with a spacer to help with administration, please call and let us know. Follow up with Dr. [place] in 4 months Call sooner if you need Chestine Spore sooner.  Please contact office for sooner follow up if symptoms do not improve or worsen or seek emergency care   Dysphagia No SLP eval in past States he is not choking on food or liquids.  Plan Continuous monitoring of getting choked on food/ liquids Swallow eval by Speech Therapy as needed   Weight loss Has lost 4 pounds in 2 months Plan Add diet supplements daily ( Boost or Ensure) Weigh yourself Follow up with PCP for weight loss  This appointment was over 30  minutes long with over 50% of the time being direct face to face patient care, assessment , plan of  care , and follow up,     Bevelyn Ngo, NP 01/15/2020  1:56 PM

## 2020-01-15 NOTE — Patient Instructions (Addendum)
It is good to see you today. Continue Stialto 2 puffs once daily Rinse mouth after use This is your maintenance inhaler, use every day without fail.  We will prescribe a rescue inhaler ( Albuterol)  for use as your rescue medication. Use this as needed up to every 4 hours for break through shortness of breath or wheezing We will prescribe a spacer for use with the albuterol inhaler.  We will teach you how to use this today. Please call your insurance and see what the cost of Bevespi would be. If you find the spacer easy to use with your rescue inhaler, and would like to switch your maintenance  medication to [facility], please call us and let us know. Follow up with Dr. Chestine Spore in 4 months Call sooner if you need [place] sooner.  Please contact office for sooner follow up if symptoms do not improve or worsen or seek emergency care

## 2020-02-02 ENCOUNTER — Telehealth: Payer: Self-pay | Admitting: Acute Care

## 2020-02-02 MED ORDER — BEVESPI AEROSPHERE 9-4.8 MCG/ACT IN AERO: 2.0000 | INHALATION_SPRAY | Freq: Two times a day (BID) | RESPIRATORY_TRACT | 6 refills | Status: DC

## 2020-02-02 NOTE — Telephone Encounter (Signed)
Rx for bevespi has been sent to preferred pharmacy for pt. Called and spoke with pt's daughter Gavin Pound letting her know this had been done and she verbalized understanding. Nothing further needed.

## 2020-02-23 ENCOUNTER — Other Ambulatory Visit: Payer: Self-pay | Admitting: Neurology

## 2020-03-30 ENCOUNTER — Ambulatory Visit: Payer: Medicare Other | Admitting: Neurology

## 2020-03-30 ENCOUNTER — Encounter: Payer: Self-pay | Admitting: Neurology

## 2020-03-30 ENCOUNTER — Other Ambulatory Visit: Payer: Self-pay

## 2020-03-30 VITALS — BP 159/89 | HR 84 | Ht 70.0 in | Wt 145.0 lb

## 2020-03-30 DIAGNOSIS — I951 Orthostatic hypotension: Secondary | ICD-10-CM

## 2020-03-30 DIAGNOSIS — G20 Parkinson's disease: Secondary | ICD-10-CM | POA: Diagnosis not present

## 2020-03-30 DIAGNOSIS — R269 Unspecified abnormalities of gait and mobility: Secondary | ICD-10-CM | POA: Diagnosis not present

## 2020-03-30 NOTE — Progress Notes (Signed)
Reason for visit: Parkinson's disease  Jorge Lester is an 86 y.o. male  History of present illness:  Jorge Lester is an 86 year old right-handed white male with a history of Parkinson's disease.  The patient has a gait disorder associated with this.  He reports a lot of problems with shortness of breath with physical activity, this appears to be of the limiting factor in his ability to remain active.  He continues to lose some weight, he has gone from 149 pounds to 145 pounds since his last visit.  He claims that he does not feel hungry in the evening, he does not eat dinner anymore.  He had 2 falls in July, he believes that his balance has changed some.  He does have dyskinesias that are worse later in the afternoon.  The patient denies any dizziness with standing.  He denies problems with swallowing or choking.  He has not had any further hallucination problems.  He sleeps well at night.  Past Medical History:  Diagnosis Date  . Anemia   . CAD (coronary artery disease)    a. CTO of LAD known since 2008, s/p DES x 2 to CTO 01/01/13. b. known residual nonobstructive disease in LCx, RCA.  [name] COPD (chronic obstructive pulmonary disease) (HCC)   . GERD (gastroesophageal reflux disease)   . Gout   . Hyperlipidemia   . Hypertension   . Mitral regurgitation    Prev mild, most recently trivial by echo 11/2012.  . Orthostatic hypotension 06/04/2017  . Parkinson's disease   . Sleep apnea     Past Surgical History:  Procedure Laterality Date  . CARDIAC CATHETERIZATION  2008  . CORONARY ANGIOPLASTY WITH STENT PLACEMENT  01/01/2013   "2" (01/01/2013)  . LUMBAR DISC SURGERY  1974  . PERCUTANEOUS CORONARY STENT INTERVENTION (PCI-S) N/A 01/01/2013   Procedure: PERCUTANEOUS CORONARY STENT INTERVENTION (PCI-S);  Surgeon: Peter M Swaziland, MD;  Location: [facility] CATH LAB;  Service: Cardiovascular;  Laterality: N/A;  . TONSILLECTOMY AND ADENOIDECTOMY  1947  . TRIGGER FINGER RELEASE  1980's  . VASECTOMY        Family History  Problem Relation Age of Onset  . Ovarian cancer Mother   . Coronary artery disease Father   . Alzheimer's disease Father   . Diabetes Brother   . Diabetes Other   . Transient ischemic attack Other     Social history:  reports that he quit smoking about 30 years ago. His smoking use included cigarettes. He started smoking about 74 years ago. He has a 88.00 pack-year smoking history. He has never used smokeless tobacco. He reports that he does not drink alcohol and does not use drugs.    Allergies  Allergen Reactions  . Amantadines     Hallucinations  . Sulfonamide Derivatives     Medications:  Prior to Admission medications   Medication Sig Start Date End Date Taking? Authorizing Provider  acetaminophen (TYLENOL 8 HOUR) 650 MG CR tablet Take 1,300 mg by mouth every morning.   Yes [provider]  albuterol (PROVENTIL) (2.5 MG/3ML) 0.083% nebulizer solution Take 3 mLs (2.5 mg total) by nebulization every 4 (four) hours as needed for wheezing or shortness of breath. Patient taking differently: Take 2.5 mg by nebulization every 4 (four) hours as needed for wheezing or shortness of breath. As needed 11/03/19  Yes Chestine Spore, Vernona Rieger P, DO  albuterol (VENTOLIN HFA) 108 (90 Base) MCG/ACT inhaler Inhale 2 puffs into the lungs every 4 (four) hours as  needed for wheezing or shortness of breath. 01/15/20  Yes Bevelyn Ngo, NP  aspirin 81 MG tablet Take 81 mg by mouth daily. Reported on 01/24/2016   Yes [provider]  carbidopa-levodopa (SINEMET CR) 50-200 MG tablet TAKE 1/2 TABLET BY MOUTH EVERY MORNING and TAKE 1/2 TABLET IN THE AFTERNOON and TAKE 1 TABLET BY MOUTH EVERY EVENING 08/31/19  Yes York Spaniel, MD  carbidopa-levodopa (SINEMET IR) 25-100 MG tablet TAKE 1 TABLET BY MOUTH EVERY MORNING, AT NOON AND TAKE 1/2 TABLET BY MOUTH EVERY EVENING 02/23/20  Yes York Spaniel, MD  doxazosin (CARDURA) 2 MG tablet Take 2 mg by mouth at bedtime.     Yes [provider]  famotidine (PEPCID) 20 MG tablet TAKE (1) TABLET TWICE DAILY. 03/07/15  Yes BranchDorothe Pea, MD  Glycopyrrolate-Formoterol (BEVESPI AEROSPHERE) 9-4.8 MCG/ACT AERO Inhale 2 puffs into the lungs in the morning and at bedtime. 02/02/20  Yes Bevelyn Ngo, NP  mirtazapine (REMERON) 15 MG tablet TAKE 1 TABLET BY MOUTH AT BEDTIME 12/28/19  Yes York Spaniel, MD  nitroGLYCERIN (NITROSTAT) 0.4 MG SL tablet Place 1 tablet (0.4 mg total) under the tongue every 5 (five) minutes as needed. 11/13/12  Yes Serpe, Clide Deutscher, PA-C  QUEtiapine (SEROQUEL) 25 MG tablet TAKE 1 TABLET BY MOUTH AT BEDTIME 06/01/19  Yes York Spaniel, MD  ropinirole (REQUIP) 5 MG tablet TAKE 1 TABLET BY MOUTH BEFORE bedtime 12/03/19  Yes York Spaniel, MD  simvastatin (ZOCOR) 40 MG tablet Take 1 tablet by mouth daily. Changed by Dr. Margo Common 11/28/15  Yes [provider]    ROS:  Out of a complete 14 system review of symptoms, the patient complains only of the following symptoms, and all other reviewed systems are negative.  Walking problems Shortness of breath  Blood pressure (!) 159/89, pulse 84, height 5\' 10"  (1.778 m), weight 145 lb (65.8 kg).  Physical Exam  General: The patient is alert and cooperative at the time of the examination.  Skin: No significant peripheral edema is noted.   Neurologic Exam  Mental status: The patient is alert and oriented x 3 at the time of the examination. The patient has apparent normal recent and remote memory, with an apparently normal attention span and concentration ability.   Cranial nerves: Facial symmetry is present. Speech is normal, no aphasia or dysarthria is noted. Extraocular movements are full. Visual fields are full.  Masking of the face is seen.  Motor: The patient has good strength in all 4 extremities.  Sensory examination: Soft touch sensation is symmetric on the face, arms, and legs.  Coordination: The patient has good  finger-nose-finger and heel-to-shin bilaterally.  The patient does have some dyskinesias involving the arms and legs and body.  Gait and station: The patient is able to stand up from a seated position with the arms crossed.  Once up, he was able to ambulate without assistance, he has a stooped posture.  His gait is slightly wide-based, tandem gait was not attempted.  Romberg is negative.  Reflexes: Deep tendon reflexes are symmetric.   Assessment/Plan:  1.  Parkinson's disease  2.  Gait disorder  The patient will continue on his current dose of Sinemet CR and Sinemet 25/100 mg tablets.  He takes 5 mg Requip in the evening.  He is having some dyskinesias.  I have recommended getting physical therapy for gait training, he does not wish to consider this at the moment, but he will  call me if he changes his mind.  He will follow up in 5 to 6 months.  Marlan Palau MD 03/30/2020 10:29 AM  [facility] [address] [place], [place] [address]  Phone [phone] Fax [phone]

## 2020-05-24 ENCOUNTER — Other Ambulatory Visit: Payer: Self-pay | Admitting: Neurology

## 2020-06-16 ENCOUNTER — Ambulatory Visit: Payer: Medicare Other | Admitting: Cardiology

## 2020-06-16 ENCOUNTER — Encounter: Payer: Self-pay | Admitting: Cardiology

## 2020-06-16 VITALS — BP 144/80 | HR 93 | Ht 70.0 in | Wt 146.8 lb

## 2020-06-16 DIAGNOSIS — E782 Mixed hyperlipidemia: Secondary | ICD-10-CM

## 2020-06-16 DIAGNOSIS — I2510 Atherosclerotic heart disease of native coronary artery without angina pectoris: Secondary | ICD-10-CM

## 2020-06-16 DIAGNOSIS — I951 Orthostatic hypotension: Secondary | ICD-10-CM | POA: Diagnosis not present

## 2020-06-16 NOTE — Patient Instructions (Signed)
Your physician wants you to follow-up in: 6 MONTHS WITH DR BRANCH   Your physician recommends that you continue on your current medications as directed. Please refer to the Current Medication list given to you today.  Thank you for choosing [facility]!!   

## 2020-06-16 NOTE — Progress Notes (Signed)
Clinical Summary Jorge Lester is a 86 y.o.male seen today for follow up of the following medical problems.   1. CAD  - PCI to LAD CTO 12/2012 with DES x2  - 11/2012 echo LVEF 55-60%,  - 12/2015 nuclear stress low risk, possible mild ischemia Medical therapy limited by soft bp's, orthostatic symptoms.      - no recent chest pain. Breathing somewhat improved since starting inhalers for his COPD - compliant with meds. Medical therpay limited due to orthostatic hypotension.      2. COPD - abnormal PFTs 08/2016 - he tried spiriva but without benefit and stopped taking on his own.   - followed by pulmonary - breathing improved with inhalers   3. Parkinsons disease - followed by neuro  3.Orhthostatic hypotension - due to his parkinsons disease - no recent symptoms, working to maintain hydration.   4. HL  - compliant with statin. He greatly prefers simvastatin 40mg  daily, has not wanted alternative statin.  - 11/2017 TC 134 TG 84 HDL 47 LDL 70  12/2019 TC 120 TG 64 HDL 51 LDL 55   Past Medical History:  Diagnosis Date  . Anemia   . CAD (coronary artery disease)    a. CTO of LAD known since 2008, s/p DES x 2 to CTO 01/01/13. b. known residual nonobstructive disease in LCx, RCA.  [name]. COPD (chronic obstructive pulmonary disease) (HCC)   . GERD (gastroesophageal reflux disease)   . Gout   . Hyperlipidemia   . Hypertension   . Mitral regurgitation    Prev mild, most recently trivial by echo 11/2012.  . Orthostatic hypotension 06/04/2017  . Parkinson's disease   . Sleep apnea      Allergies  Allergen Reactions  . Amantadines     Hallucinations  . Sulfonamide Derivatives      Current Outpatient Medications  Medication Sig Dispense Refill  . acetaminophen (TYLENOL 8 HOUR) 650 MG CR tablet Take 1,300 mg by mouth every morning.    [name]. albuterol (PROVENTIL) (2.5 MG/3ML) 0.083% nebulizer solution Take 3 mLs (2.5 mg total) by nebulization every 4 (four)  hours as needed for wheezing or shortness of breath. (Patient taking differently: Take 2.5 mg by nebulization every 4 (four) hours as needed for wheezing or shortness of breath. As needed) 75 mL 11  . albuterol (VENTOLIN HFA) 108 (90 Base) MCG/ACT inhaler Inhale 2 puffs into the lungs every 4 (four) hours as needed for wheezing or shortness of breath. 8 g 5  . aspirin 81 MG tablet Take 81 mg by mouth daily. Reported on 01/24/2016    . carbidopa-levodopa (SINEMET CR) 50-200 MG tablet TAKE 1/2 TABLET BY MOUTH EVERY MORNING and TAKE 1/2 TABLET IN THE AFTERNOON and TAKE 1 TABLET BY MOUTH EVERY EVENING 60 tablet 11  . carbidopa-levodopa (SINEMET IR) 25-100 MG tablet TAKE 1 TABLET BY MOUTH EVERY MORNING, AT NOON AND TAKE 1/2 TABLET BY MOUTH EVERY EVENING 225 tablet 1  . doxazosin (CARDURA) 2 MG tablet Take 2 mg by mouth at bedtime.      . famotidine (PEPCID) 20 MG tablet TAKE (1) TABLET TWICE DAILY. 60 tablet 3  . Glycopyrrolate-Formoterol (BEVESPI AEROSPHERE) 9-4.8 MCG/ACT AERO Inhale 2 puffs into the lungs in the morning and at bedtime. 10.7 g 6  . mirtazapine (REMERON) 15 MG tablet TAKE 1 TABLET BY MOUTH AT BEDTIME 90 tablet 1  . nitroGLYCERIN (NITROSTAT) 0.4 MG SL tablet Place 1 tablet (0.4 mg total) under the tongue every 5 (  five) minutes as needed. 25 tablet 3  . QUEtiapine (SEROQUEL) 25 MG tablet TAKE 1 TABLET BY MOUTH AT BEDTIME 90 tablet 3  . ropinirole (REQUIP) 5 MG tablet TAKE 1 TABLET BY MOUTH BEFORE bedtime 30 tablet 11  . simvastatin (ZOCOR) 40 MG tablet Take 1 tablet by mouth daily. Changed by Dr. Margo Lester     No current facility-administered medications for this visit.     Past Surgical History:  Procedure Laterality Date  . CARDIAC CATHETERIZATION  2008  . CORONARY ANGIOPLASTY WITH STENT PLACEMENT  01/01/2013   "2" (01/01/2013)  . LUMBAR DISC SURGERY  1974  . PERCUTANEOUS CORONARY STENT INTERVENTION (PCI-S) N/A 01/01/2013   Procedure: PERCUTANEOUS CORONARY STENT INTERVENTION (PCI-S);   Surgeon: Jorge M Swaziland, MD;  Location: [facility] CATH LAB;  Service: Cardiovascular;  Laterality: N/A;  . TONSILLECTOMY AND ADENOIDECTOMY  1947  . TRIGGER FINGER RELEASE  1980's  . VASECTOMY       Allergies  Allergen Reactions  . Amantadines     Hallucinations  . Sulfonamide Derivatives       Family History  Problem Relation Age of Onset  . Ovarian cancer Mother   . Coronary artery disease Father   . Alzheimer's disease Father   . Diabetes Brother   . Diabetes Other   . Transient ischemic attack Other      Social History Jorge Lester reports that he quit smoking about 30 years ago. His smoking use included cigarettes. He started smoking about 74 years ago. He has a 88.00 pack-year smoking history. He has never used smokeless tobacco. Jorge Lester reports no history of alcohol use.   Review of Systems CONSTITUTIONAL: No weight loss, fever, chills, weakness or fatigue.  HEENT: Eyes: No visual loss, blurred vision, double vision or yellow sclerae.No hearing loss, sneezing, congestion, runny nose or sore throat.  SKIN: No rash or itching.  CARDIOVASCULAR: per hpi RESPIRATORY: No shortness of breath, cough or sputum.  GASTROINTESTINAL: No anorexia, nausea, vomiting or diarrhea. No abdominal pain or blood.  GENITOURINARY: No burning on urination, no polyuria NEUROLOGICAL: No headache, dizziness, syncope, paralysis, ataxia, numbness or tingling in the extremities. No change in bowel or bladder control.  MUSCULOSKELETAL: No muscle, back pain, joint pain or stiffness.  LYMPHATICS: No enlarged nodes. No history of splenectomy.  PSYCHIATRIC: No history of depression or anxiety.  ENDOCRINOLOGIC: No reports of sweating, cold or heat intolerance. No polyuria or polydipsia.  [name]   Physical Examination Today's Vitals   06/16/20 1406  BP: (!) 144/80  Pulse: 93  SpO2: 97%  Weight: 146 lb 12.8 oz (66.6 kg)  Height: 5\' 10"  (1.778 m)   Body mass index is 21.06 kg/m.  Gen: resting  comfortably, no acute distress HEENT: no scleral icterus, pupils equal round and reactive, no palptable cervical adenopathy,  CV: RRR, no m/r/g, no jvd Resp: Clear to auscultation bilaterally GI: abdomen is soft, non-tender, non-distended, normal bowel sounds, no hepatosplenomegaly MSK: extremities are warm, no edema.  Skin: warm, no rash Neuro:  no focal deficits Psych: appropriate affect   Diagnostic Studies 11/2012 [facility] Procedural Findings:  Hemodynamics:  AO 117/52 with a mean of 79 mmHg  LV 116/14 mmHg  Coronary angiography:  Coronary dominance: right  Left mainstem: The left main coronary is normal.  Left anterior descending (LAD): The left anterior descending artery is diffusely diseased in the proximal vessel up to 75%. It is occluded in the mid vessel. There are left to left collaterals from the ramus intermediate Yasmeen Manka.  There are also right-to-left collaterals via septal perforators to the distal LAD. The proximal Is well-defined and there appears to be a small tract through the occlusion.  There is a ramus intermediate Fielding Mault which is moderate to large. It has mild irregularities less than 20%.  Left circumflex (LCx): The left circumflex gives rise to a single bifurcating marginal Quran Vasco. The proximal OM has a segmental 60% stenosis.  Right coronary artery (RCA): The right coronary is a large dominant vessel. It is diffusely diseased throughout with stenosis up to 40% in the mid vessel. There is significant plaque burden throughout without significant stenosis.  Left ventriculography: Left ventricular systolic function is normal, LVEF is estimated at 55-65%, there is no significant mitral regurgitation  Final Conclusions:  1. Single vessel occlusive coronary disease with chronic total occlusion of the mid LAD.  2. Normal left ventricular function.  Recommendations: The patient has significant symptoms and ischemia on noninvasive testing despite 2 antianginal  agents. His LAD CTO appears suitable for PCI. The alternative would be to increase his antianginal therapy. I will discuss options with the patient.   01/01/13 Lesion Data:  Vessel: LAD  Percent stenosis (pre): 100%  TIMI-flow (pre): 0  Stent: 2.5 x 38 and 2.75 x 12 mm Promus premier stents  Percent stenosis (post): 0%  TIMI-flow (post): 3  Conclusions:  Successful stenting of the mid LAD for CTO with drug-eluting stents.   11/2012 Echo LVEF 55-60%, grade II diastolic dysfunction,   11/2015 echo Study Conclusions  - Left ventricle: The cavity size was normal. Wall thickness was  normal. Systolic function was normal. The estimated ejection  fraction was in the range of 55% to 60%. Wall motion was normal;  there were no regional wall motion abnormalities. Doppler  parameters are consistent with abnormal left ventricular  relaxation (grade 1 diastolic dysfunction). - Aortic valve: There was mild regurgitation. Valve area (VTI):  2.49 cm^2. Valve area (Vmax): 2.39 cm^2. Valve area (Vmean): 2.42  cm^2. - Mitral valve: There was mild regurgitation. - Right ventricle: The cavity size was mildly dilated. - Right atrium: The atrium was mildly dilated. - Technically adequate study.  12/2015 MPI Perfusion Summary Defect 1:  There is a small defect of mild severity present in the apical anterior location. The defect is partially reversible. Small, mild intensity, partially reversible apical anterior defect suggesting a minor region of ischemia.  Defect 2:  There is a small defect of mild severity present in the mid inferoseptal and apical inferior location. The defect is non-reversible. Small, mild intensity, mid to apical inferior/inferoseptal defect consistent with soft tissue attenuation.    Overall Study Impression Myocardial perfusion is abnormal. This is a low risk study. Overall left ventricular systolic function was normal. Nuclear stress EF: 66%     08/2016 PFTs +COPD   01/2019 echo IMPRESSIONS   1. The left ventricle has normal systolic function, with an ejection fraction of 55-60%. The cavity size was normal. There is mildly increased left ventricular wall thickness. Left ventricular diastolic Doppler parameters are consistent with impaired  relaxation. No evidence of left ventricular regional wall motion abnormalities. 2. The right ventricle has normal systolic function. The cavity was normal. There is no increase in right ventricular wall thickness. Right ventricular systolic pressure could not be assessed. 3. The aortic valve is tricuspid. Mild calcification of the aortic valve. Aortic valve regurgitation is mild by color flow Doppler. Mild aortic annular calcification noted. 4. The mitral valve is grossly normal. There is mild  mitral annular calcification present. 5. The tricuspid valve is grossly normal. 6. The aortic root is normal in size and structure.     Assessment and Plan  1. CAD  - no symptoms, continue current meds - medical therapy limited by orthostatic hypotension     2. Orthostatic hypotension - continue aggressive hydration, has not required medical therapy    3. Hyperlipidemia  - he changed back to simva 40mg  on his own, no side effects on atorva 80 but uncomfortable taking that dose.  -he is at goal LDL, continue current statin   , M.D.

## 2020-06-25 ENCOUNTER — Other Ambulatory Visit: Payer: Self-pay | Admitting: Neurology

## 2020-06-30 ENCOUNTER — Encounter: Payer: Self-pay | Admitting: Critical Care Medicine

## 2020-06-30 ENCOUNTER — Other Ambulatory Visit: Payer: Self-pay

## 2020-06-30 ENCOUNTER — Ambulatory Visit: Payer: Medicare Other | Admitting: Critical Care Medicine

## 2020-06-30 VITALS — BP 122/70 | HR 100 | Temp 97.8°F | Ht 68.0 in | Wt 144.2 lb

## 2020-06-30 DIAGNOSIS — R0609 Other forms of dyspnea: Secondary | ICD-10-CM

## 2020-06-30 DIAGNOSIS — J449 Chronic obstructive pulmonary disease, unspecified: Secondary | ICD-10-CM | POA: Diagnosis not present

## 2020-06-30 DIAGNOSIS — R0600 Dyspnea, unspecified: Secondary | ICD-10-CM | POA: Diagnosis not present

## 2020-06-30 MED ORDER — BEVESPI AEROSPHERE 9-4.8 MCG/ACT IN AERO: 2.0000 | INHALATION_SPRAY | Freq: Two times a day (BID) | RESPIRATORY_TRACT | 6 refills | Status: DC

## 2020-06-30 NOTE — Patient Instructions (Addendum)
Thank you for visiting Dr. Chestine Spore at [facility]. We recommend the following:  Keep all medications the same - keep using your spacer with this medication.  Consider getting your covid and flu vaccines please.  Meds ordered this encounter  Medications  . Glycopyrrolate-Formoterol (BEVESPI AEROSPHERE) 9-4.8 MCG/ACT AERO    Sig: Inhale 2 puffs into the lungs in the morning and at bedtime.    Dispense:  10.7 g    Refill:  6    Return in about 6 months (around 12/29/2020). with Dr. Francine Graven (30 minutes visit).    Please do your part to reduce the spread of COVID-19.

## 2020-06-30 NOTE — Progress Notes (Signed)
Synopsis: Referred in April 2021 for shortness of breath by Suzan Slick, MD.  Subjective:   PATIENT ID: Jorge Lester GENDER: male DOB: 1934-01-16, MRN: [id number]  Chief Complaint  Patient presents with   Follow-up    Patient feels fine overall, feels like breathing is about the same as last visit,     Jorge Lester is an 86 y/o gentleman with a history of Parkinson's disease and COPD who presents for follow-up.  He is accompanied today by his daughter.  He has been doing well on Bevespi plus a spacer.  He prefers this to [employer].  Nebulized bronchodilators were prohibitively expensive unfortunately.  He has not required albuterol.  He continues to have significant dyspnea on exertion that limits his activity.  No wheezing or cough.  He has pill dysphagia, but otherwise denies issues with solids or liquids.  He had follow-up with his neurologist on 9/12-notes reviewed.  Gait training was recommended at that time, but he declined.  Not vaccinated for Covid or the flu.    OV 11/03/19: Jorge Lester is an 86 y/o gentleman with a history of Parkinson's disease and COPD.  He is accompanied to today's visit by his daughter.  He was previously prescribed Spiriva but stopped using it due to no perceived benefit.  His daughter feels that he had difficulty coordinating use of this inhaler.  He complains of shortness of breath whenever he is not sitting still.  He has no worsening of his symptoms when he is supine.  He denies sputum production or wheezing, but has an occasional dry cough at night.  He has not been using albuterol.  He quit smoking 35 years ago after 35 years x 2 pack/day.  He has been less active for the last year due to shortness of breath and has had decreased activity over the past few years due to his Parkinson's, but several years ago was still able to walk 3 miles per day.  He has had a few episodes of dysphagia with choking, but these have been infrequent and spaced out.  His  family is cautious to cut his food into small bites, especially with meat.  He has never been evaluated by SLP.  He has had falls in the past and his neurologist most recently noted worsening balance issues.  He has had reduced appetite and has lost about 5 pounds over the past year.   Past Medical History:  Diagnosis Date   Anemia    CAD (coronary artery disease)    a. CTO of LAD known since 2008, s/p DES x 2 to CTO 01/01/13. b. known residual nonobstructive disease in LCx, RCA.   COPD (chronic obstructive pulmonary disease) (HCC)    GERD (gastroesophageal reflux disease)    Gout    Hyperlipidemia    Hypertension    Mitral regurgitation    Prev mild, most recently trivial by echo 11/2012.   Orthostatic hypotension 06/04/2017   Parkinson's disease    Sleep apnea      Family History  Problem Relation Age of Onset   Ovarian cancer Mother    Coronary artery disease Father    Alzheimer's disease Father    Diabetes Brother    Diabetes Other    Transient ischemic attack Other      Past Surgical History:  Procedure Laterality Date   CARDIAC CATHETERIZATION  2008   CORONARY ANGIOPLASTY WITH STENT PLACEMENT  01/01/2013   "2" (01/01/2013)   LUMBAR DISC SURGERY  1974  PERCUTANEOUS CORONARY STENT INTERVENTION (PCI-S) N/A 01/01/2013   Procedure: PERCUTANEOUS CORONARY STENT INTERVENTION (PCI-S);  Surgeon: Peter M SwazilandJordan, MD;  Location: [facility] CATH [facility];  Service: Cardiovascular;  Laterality: N/A;   TONSILLECTOMY AND ADENOIDECTOMY  1947   TRIGGER FINGER RELEASE  1980's   VASECTOMY      Social History   Socioeconomic History   Marital status: Married    Spouse name: Not on file   Number of children: 1   Years of education: 12   Highest education level: Not on file  Occupational History   Occupation: Retired  Tobacco Use   Smoking status: Former Smoker    Packs/day: 2.00    Years: 44.00    Pack years: 88.00    Types: Cigarettes    Start date: 07/16/1945     Quit date: 07/16/1989    Years since quitting: 30.9   Smokeless tobacco: Never Used  Vaping Use   Vaping Use: Never used  Substance and Sexual Activity   Alcohol use: No    Alcohol/week: 0.0 standard drinks    Comment: 01/01/2013 "last alcohol was 1983"   Drug use: No   Sexual activity: Yes  Other Topics Concern   Not on file  Social History Narrative   Lives at home alone   Patient is right handed.   Patient drinks 1 cup of caffeine per day.   Social Determinants of Health   Financial Resource Strain: Not on file  Food Insecurity: Not on file  Transportation Needs: Not on file  Physical Activity: Not on file  Stress: Not on file  Social Connections: Not on file  Intimate Partner Violence: Not on file     Allergies  Allergen Reactions   Amantadines     Hallucinations   Sulfonamide Derivatives      Immunization History  Administered Date(s) Administered   Influenza-Unspecified 05/13/2012, 05/27/2014, 04/26/2015   Pneumococcal Conjugate-13 01/06/2019   Pneumococcal-Unspecified 03/17/2011    Outpatient Medications Prior to Visit  Medication Sig Dispense Refill   acetaminophen (TYLENOL) 650 MG CR tablet Take 1,300 mg by mouth every morning.     albuterol (PROVENTIL) (2.5 MG/3ML) 0.083% nebulizer solution Take 3 mLs (2.5 mg total) by nebulization every 4 (four) hours as needed for wheezing or shortness of breath. 75 mL 11   albuterol (VENTOLIN HFA) 108 (90 Base) MCG/ACT inhaler Inhale 2 puffs into the lungs every 4 (four) hours as needed for wheezing or shortness of breath. 8 g 5   aspirin 81 MG tablet Take 81 mg by mouth daily. Reported on 01/24/2016     carbidopa-levodopa (SINEMET CR) 50-200 MG tablet TAKE 1/2 TABLET BY MOUTH EVERY MORNING and TAKE 1/2 TABLET IN THE AFTERNOON and TAKE 1 TABLET BY MOUTH EVERY EVENING 60 tablet 11   carbidopa-levodopa (SINEMET IR) 25-100 MG tablet TAKE 1 TABLET BY MOUTH EVERY MORNING, AT NOON AND TAKE 1/2 TABLET BY MOUTH  EVERY EVENING 225 tablet 1   doxazosin (CARDURA) 2 MG tablet Take 2 mg by mouth at bedtime.       famotidine (PEPCID) 20 MG tablet TAKE (1) TABLET TWICE DAILY. 60 tablet 3   mirtazapine (REMERON) 15 MG tablet TAKE 1 TABLET BY MOUTH AT BEDTIME 90 tablet 1   nitroGLYCERIN (NITROSTAT) 0.4 MG SL tablet Place 1 tablet (0.4 mg total) under the tongue every 5 (five) minutes as needed. 25 tablet 3   QUEtiapine (SEROQUEL) 25 MG tablet TAKE 1 TABLET BY MOUTH AT BEDTIME 90 tablet 3   ropinirole (  REQUIP) 5 MG tablet TAKE 1 TABLET BY MOUTH BEFORE bedtime 30 tablet 11   simvastatin (ZOCOR) 40 MG tablet Take 1 tablet by mouth daily. Changed by Dr. Margo Common     Glycopyrrolate-Formoterol (BEVESPI AEROSPHERE) 9-4.8 MCG/ACT AERO Inhale 2 puffs into the lungs in the morning and at bedtime. 10.7 g 6   No facility-administered medications prior to visit.    Review of Systems  Constitutional: Positive for weight loss.       Reduced appetite  HENT: Negative for congestion.   Respiratory: Positive for cough and shortness of breath. Negative for sputum production and wheezing.   Cardiovascular: Negative for chest pain and leg swelling.  Gastrointestinal: Negative.   Musculoskeletal: Negative.   Neurological: Positive for weakness.       Tremors, instability  Endo/Heme/Allergies: Negative for environmental allergies.     Objective:   Vitals:   06/30/20 0901  BP: 122/70  Pulse: 100  Temp: 97.8 F (36.6 C)  TempSrc: Temporal  SpO2: 96%  Weight: 144 lb 3.2 oz (65.4 kg)  Height: 5\' 8"  (1.727 m)   96% on   RA BMI Readings from Last 3 Encounters:  06/30/20 21.93 kg/m  06/16/20 21.06 kg/m  03/30/20 20.81 kg/m   Wt Readings from Last 3 Encounters:  06/30/20 144 lb 3.2 oz (65.4 kg)  06/16/20 146 lb 12.8 oz (66.6 kg)  03/30/20 145 lb (65.8 kg)    Physical Exam Vitals reviewed.  HENT:     Head: Normocephalic and atraumatic.  Eyes:     General: No scleral icterus. Cardiovascular:      Rate and Rhythm: Normal rate and regular rhythm.     Heart sounds: No murmur heard.   Pulmonary:     Comments: CTAB, no conversational dyspnea. Abdominal:     General: There is no distension.     Palpations: Abdomen is soft.  Musculoskeletal:        General: No swelling or deformity.     Cervical back: Neck supple.  Skin:    General: Skin is warm and dry.     Findings: No rash.  Neurological:     Mental Status: He is alert. Mental status is at baseline.     Comments: choreaform movements, delayed verbal responses  Psychiatric:        Mood and Affect: Mood normal.      CBC    Component Value Date/Time   WBC 7.3 01/02/2013 0523   RBC 4.07 (L) 01/02/2013 0523   HGB 12.0 (L) 01/02/2013 0523   HCT 35.9 (L) 01/02/2013 0523   PLT 146 (L) 01/02/2013 0523   MCV 88.2 01/02/2013 0523   MCH 29.5 01/02/2013 0523   MCHC 33.4 01/02/2013 0523   RDW 13.7 01/02/2013 0523    CHEMISTRY No results for input(s): NA, K, CL, CO2, GLUCOSE, BUN, CREATININE, CALCIUM, MG, PHOS in the last 168 hours. CrCl cannot be calculated (Patient's most recent lab result is older than the maximum 21 days allowed.).   Chest Imaging- films reviewed: CXR, 2 view 01/08/2019- hyperinflation, increased lung markings bilaterally, airway thickening.  Mild kyphosis.  Pulmonary Functions Testing Results: PFT Results Latest Ref Rng & Units 08/22/2016  FVC-Pre L 3.64  FVC-Predicted Pre % 83  FVC-Post L 3.58  FVC-Predicted Post % 82  Pre FEV1/FVC % % 66  Post FEV1/FCV % % 68  FEV1-Pre L 2.41  FEV1-Predicted Pre % 77  FEV1-Post L 2.44  DLCO uncorrected ml/min/mmHg 16.33  DLCO UNC% % 46  DLVA Predicted %  55  TLC L 6.52  TLC % Predicted % 87  RV % Predicted % 108   2018-moderate obstruction without significant bronchodilator reversibility.  No significant air trapping or hyperinflation.  Moderately impaired diffusion.  Flow volume loop supports obstruction.  Echocardiogram 01/15/2019: LVEF 55 to 60%, mild LVH.   Diastolic dysfunction is present.  Normal LA, RV, RA.  Mild AI, trivial MR and TR.  Normal-sized IVC with normal respiratory variability.      Assessment & Plan:     ICD-10-CM   1. Chronic obstructive pulmonary disease, unspecified COPD type (HCC)  J44.9   2. DOE (dyspnea on exertion)  R06.00     Dyspnea on exertion due to COPD, remote history of tobacco abuse -Continue Bevespi twice daily with spacer  -Albuterol inhaler plus spacer every 4 hours PRN. -I again recommended Covid and flu vaccines.  We discussed his increased risk of severe disease due to baseline COPD and Parkinson's disease.  He prefers to forego these vaccines.  He is up-to-date on pneumonia vaccines. -Outside the recommended age for lung cancer screening. -Dyspnea on exertion and concern for a component of deconditioning.  I think outpatient physical therapy would be a reasonable therapy to try to preserve his activity tolerance.  Dysphagia, likely a complication of Parkinson's-appears stable -May need SLP evaluation in the future.  RTC in 6 months with Dr. Francine Graven.    Current Outpatient Medications:    acetaminophen (TYLENOL) 650 MG CR tablet, Take 1,300 mg by mouth every morning., Disp: , Rfl:    albuterol (PROVENTIL) (2.5 MG/3ML) 0.083% nebulizer solution, Take 3 mLs (2.5 mg total) by nebulization every 4 (four) hours as needed for wheezing or shortness of breath., Disp: 75 mL, Rfl: 11   albuterol (VENTOLIN HFA) 108 (90 Base) MCG/ACT inhaler, Inhale 2 puffs into the lungs every 4 (four) hours as needed for wheezing or shortness of breath., Disp: 8 g, Rfl: 5   aspirin 81 MG tablet, Take 81 mg by mouth daily. Reported on 01/24/2016, Disp: , Rfl:    carbidopa-levodopa (SINEMET CR) 50-200 MG tablet, TAKE 1/2 TABLET BY MOUTH EVERY MORNING and TAKE 1/2 TABLET IN THE AFTERNOON and TAKE 1 TABLET BY MOUTH EVERY EVENING, Disp: 60 tablet, Rfl: 11   carbidopa-levodopa (SINEMET IR) 25-100 MG tablet, TAKE 1 TABLET BY MOUTH  EVERY MORNING, AT NOON AND TAKE 1/2 TABLET BY MOUTH EVERY EVENING, Disp: 225 tablet, Rfl: 1   doxazosin (CARDURA) 2 MG tablet, Take 2 mg by mouth at bedtime.  , Disp: , Rfl:    famotidine (PEPCID) 20 MG tablet, TAKE (1) TABLET TWICE DAILY., Disp: 60 tablet, Rfl: 3   mirtazapine (REMERON) 15 MG tablet, TAKE 1 TABLET BY MOUTH AT BEDTIME, Disp: 90 tablet, Rfl: 1   nitroGLYCERIN (NITROSTAT) 0.4 MG SL tablet, Place 1 tablet (0.4 mg total) under the tongue every 5 (five) minutes as needed., Disp: 25 tablet, Rfl: 3   QUEtiapine (SEROQUEL) 25 MG tablet, TAKE 1 TABLET BY MOUTH AT BEDTIME, Disp: 90 tablet, Rfl: 3   ropinirole (REQUIP) 5 MG tablet, TAKE 1 TABLET BY MOUTH BEFORE bedtime, Disp: 30 tablet, Rfl: 11   simvastatin (ZOCOR) 40 MG tablet, Take 1 tablet by mouth daily. Changed by Dr. Margo Common, Disp: , Rfl:    Glycopyrrolate-Formoterol (BEVESPI AEROSPHERE) 9-4.8 MCG/ACT AERO, Inhale 2 puffs into the lungs in the morning and at bedtime., Disp: 10.7 g, Rfl: 6     Steffanie Dunn, DO [facility] 06/30/2020 11:24 AM

## 2020-08-21 ENCOUNTER — Other Ambulatory Visit: Payer: Self-pay | Admitting: Neurology

## 2020-10-12 ENCOUNTER — Ambulatory Visit: Payer: Medicare Other | Admitting: Neurology

## 2020-10-12 ENCOUNTER — Encounter: Payer: Self-pay | Admitting: Neurology

## 2020-10-12 VITALS — BP 138/80 | HR 101 | Ht 68.0 in | Wt 151.0 lb

## 2020-10-12 DIAGNOSIS — R269 Unspecified abnormalities of gait and mobility: Secondary | ICD-10-CM

## 2020-10-12 DIAGNOSIS — G20 Parkinson's disease: Secondary | ICD-10-CM

## 2020-10-12 DIAGNOSIS — G20A1 Parkinson's disease without dyskinesia, without mention of fluctuations: Secondary | ICD-10-CM

## 2020-10-12 MED ORDER — CARBIDOPA-LEVODOPA ER 50-200 MG PO TBCR
EXTENDED_RELEASE_TABLET | ORAL | 3 refills | Status: DC
Start: 2020-10-12 — End: 2021-09-19

## 2020-10-12 NOTE — Patient Instructions (Signed)
With the Sinemet CR 50/200 mg take 1/2 in the morning and midday, and one full tablet at 6 PM and 12 midnight when you get up to go to the bathroom.  Sinemet (carbidopa) may result in confusion or hallucinations, drowsiness, nausea, or dizziness. If any significant side effects are noted, please contact our office. Sinemet may not be well absorbed when taken with high protein meals, if tolerated it is best to take 30-45 minutes before you eat.

## 2020-10-12 NOTE — Progress Notes (Signed)
Reason for visit: Parkinson's disease  Jorge Lester is an 87 y.o. male  History of present illness:  Jorge Lester is an 87 year old right-handed white male with a history of Parkinson's disease.  The patient does have some difficulty with gait, he may fall on occasion, he last fell about a month ago without injury.  He indicates that he typically falls when he is trying to make a turn.  He may use the walker inside the house and he generally does not fall while using the walker.  He indicates that he goes to bed around 6 PM and gets up around 7 AM the next morning, it may take him 2 hours to "get going".  The patient takes Sinemet CR 50/200 mg tablets, half tablet in the morning and midday and 1 tablet at 6 PM when he goes to bed.  He takes Sinemet IR 25/100 mg tablet, 1 in the morning and noon, half tablet in the evening.  The patient indicates that he usually sleeps for about 4 to 5 hours and then gets up to go the bathroom at night.  He sleeps well.  He denies any problems with chewing or swallowing.  He also takes Seroquel at night and he has not reported any recent hallucination problems.  He returns for further evaluation.  Past Medical History:  Diagnosis Date  . Anemia   . CAD (coronary artery disease)    a. CTO of LAD known since 2008, s/p DES x 2 to CTO 01/01/13. b. known residual nonobstructive disease in LCx, RCA.  [name] COPD (chronic obstructive pulmonary disease) (HCC)   . GERD (gastroesophageal reflux disease)   . Gout   . Hyperlipidemia   . Hypertension   . Mitral regurgitation    Prev mild, most recently trivial by echo 11/2012.  . Orthostatic hypotension 06/04/2017  . Parkinson's disease   . Sleep apnea     Past Surgical History:  Procedure Laterality Date  . CARDIAC CATHETERIZATION  2008  . CORONARY ANGIOPLASTY WITH STENT PLACEMENT  01/01/2013   "2" (01/01/2013)  . LUMBAR DISC SURGERY  1974  . PERCUTANEOUS CORONARY STENT INTERVENTION (PCI-S) N/A 01/01/2013    Procedure: PERCUTANEOUS CORONARY STENT INTERVENTION (PCI-S);  Surgeon: Peter M Swaziland, MD;  Location[facility] CATH LAB;  Service: Cardiovascular;  Laterality: N/A;  . TONSILLECTOMY AND ADENOIDECTOMY  1947  . TRIGGER FINGER RELEASE  1980's  . VASECTOMY      Family History  Problem Relation Age of Onset  . Ovarian cancer Mother   . Coronary artery disease Father   . Alzheimer's disease Father   . Diabetes Brother   . Diabetes Other   . Transient ischemic attack Other     Social history:  reports that he quit smoking about 31 years ago. His smoking use included cigarettes. He started smoking about 75 years ago. He has a 88.00 pack-year smoking history. He has never used smokeless tobacco. He reports that he does not drink alcohol and does not use drugs.    Allergies  Allergen Reactions  . Amantadines     Hallucinations  . Sulfonamide Derivatives     Medications:  Prior to Admission medications   Medication Sig Start Date End Date Taking? Authorizing Provider  acetaminophen (TYLENOL) 650 MG CR tablet Take 1,300 mg by mouth every morning.   Yes [provider]  albuterol (PROVENTIL) (2.5 MG/3ML) 0.083% nebulizer solution Take 3 mLs (2.5 mg total) by nebulization every 4 (four) hours as needed for  wheezing or shortness of breath. 11/03/19  Yes Karie Fetch P, DO  albuterol (VENTOLIN HFA) 108 (90 Base) MCG/ACT inhaler Inhale 2 puffs into the lungs every 4 (four) hours as needed for wheezing or shortness of breath. 01/15/20  Yes Bevelyn Ngo, NP  aspirin 81 MG tablet Take 81 mg by mouth daily. Reported on 01/24/2016   Yes [provider]  carbidopa-levodopa (SINEMET CR) 50-200 MG tablet TAKE 1/2 TABLET BY MOUTH EVERY MORNING and TAKE 1/2 TABLET IN THE AFTERNOON and TAKE 1 TABLET BY MOUTH EVERY EVENING 08/22/20  Yes Jorge Spaniel, MD  carbidopa-levodopa (SINEMET IR) 25-100 MG tablet TAKE 1 TABLET BY MOUTH EVERY MORNING, AT NOON AND TAKE 1/2 TABLET BY MOUTH EVERY EVENING 08/22/20   Yes Jorge Spaniel, MD  doxazosin (CARDURA) 2 MG tablet Take 2 mg by mouth at bedtime.     Yes [provider]  famotidine (PEPCID) 20 MG tablet TAKE (1) TABLET TWICE DAILY. 03/07/15  Yes BranchDorothe Pea, MD  Glycopyrrolate-Formoterol (BEVESPI AEROSPHERE) 9-4.8 MCG/ACT AERO Inhale 2 puffs into the lungs in the morning and at bedtime. 06/30/20  Yes Karie Fetch P, DO  mirtazapine (REMERON) 15 MG tablet TAKE 1 TABLET BY MOUTH AT BEDTIME 06/27/20  Yes Jorge Spaniel, MD  nitroGLYCERIN (NITROSTAT) 0.4 MG SL tablet Place 1 tablet (0.4 mg total) under the tongue every 5 (five) minutes as needed. 11/13/12  Yes Serpe, Clide Deutscher, PA-C  QUEtiapine (SEROQUEL) 25 MG tablet TAKE 1 TABLET BY MOUTH AT BEDTIME 05/24/20  Yes Jorge Spaniel, MD  ropinirole (REQUIP) 5 MG tablet TAKE 1 TABLET BY MOUTH BEFORE bedtime 12/03/19  Yes Jorge Spaniel, MD  simvastatin (ZOCOR) 40 MG tablet Take 1 tablet by mouth daily. Changed by Dr. Margo Common 11/28/15  Yes [provider]    ROS:  Out of a complete 14 system review of symptoms, the patient complains only of the following symptoms, and all other reviewed systems are negative.  Walking difficulty   Height 5\' 8"  (1.727 m), weight 151 lb (68.5 kg).  Physical Exam  General: The patient is alert and cooperative at the time of the examination.  Skin: No significant peripheral edema is noted.   Neurologic Exam  Mental status: The patient is alert and oriented x 3 at the time of the examination. The patient has apparent normal recent and remote memory, with an apparently normal attention span and concentration ability.   Cranial nerves: Facial symmetry is present. Speech is normal, no aphasia or dysarthria is noted. Extraocular movements are full. Visual fields are full.  Motor: The patient has good strength in all 4 extremities.  Sensory examination: Soft touch sensation is symmetric on the face, arms, and legs.  Coordination: The patient  has good finger-nose-finger and heel-to-shin bilaterally.  The patient does have dyskinesias involving the body and arms and legs.  Gait and station: The patient is able to arise from a seated position with arms crossed, once up, he is able to ambulate without assistance, he has a stooped posture.  He does have bilateral arm swing that is symmetric.  He has some shuffling with turns.  Tandem gait is unsteady.  Romberg is negative.  Reflexes: Deep tendon reflexes are symmetric.   Assessment/Plan:  1.  Parkinson's disease  The patient is sleeping about 13 hours each night.  By the time he wakes up in the morning the Sinemet is no longer effective.  I will have the patient take a Sinemet CR  50/200 mg tablet around midnight when he gets up to go to the bathroom.  Hopefully this will help him function better in the first several hours of the day.  The patient will continue the Sinemet IR tablets as currently prescribed, he will continue the Seroquel.  He will follow-up here in 5 months.  He is also on Requip 5 mg in the evening.  Marlan Palau MD 10/12/2020 10:24 AM  [facility] [address] [place], [place] [address]  Phone [phone] Fax [phone]

## 2020-11-21 ENCOUNTER — Other Ambulatory Visit: Payer: Self-pay | Admitting: Neurology

## 2020-12-20 ENCOUNTER — Other Ambulatory Visit: Payer: Self-pay | Admitting: Neurology

## 2020-12-20 ENCOUNTER — Other Ambulatory Visit: Payer: Self-pay | Admitting: Emergency Medicine

## 2021-01-04 ENCOUNTER — Ambulatory Visit: Payer: Medicare Other | Admitting: Cardiology

## 2021-01-04 ENCOUNTER — Encounter: Payer: Self-pay | Admitting: Cardiology

## 2021-01-04 ENCOUNTER — Other Ambulatory Visit: Payer: Self-pay

## 2021-01-04 VITALS — BP 120/72 | HR 98 | Ht 68.0 in | Wt 147.0 lb

## 2021-01-04 DIAGNOSIS — I951 Orthostatic hypotension: Secondary | ICD-10-CM

## 2021-01-04 DIAGNOSIS — E782 Mixed hyperlipidemia: Secondary | ICD-10-CM

## 2021-01-04 DIAGNOSIS — I2510 Atherosclerotic heart disease of native coronary artery without angina pectoris: Secondary | ICD-10-CM | POA: Diagnosis not present

## 2021-01-04 NOTE — Progress Notes (Signed)
Clinical Summary Jorge Lester is a 87 y.o.maleseen today for follow up of the following medical problems.    1. CAD   - PCI to LAD CTO 12/2012 with DES x2   - 11/2012 echo LVEF 55-60%,   - 12/2015 nuclear stress low risk, possible mild ischemia   Medical therapy limited by soft bp's, orthostatic symptoms.        - no recent chest pain. CHronic SOB stable. No recent edema.  Medical therpay limited due to orthostatic hypotension.    2. COPD - abnormal PFTs 08/2016 - he tried spiriva but without benefit and stopped taking on his own.    - followed by pulmonary - breathing improved with inhalers     3. Parkinsons disease - followed by neuro   3.Orhthostatic hypotension - due to his parkinsons disease  - no recent symptoms. Working to stay well hydrated.    4. HL   - compliant with statin. He greatly prefers simvastatin 40mg  daily, has not wanted alternative statin.   - 11/2017 TC 134 TG 84 HDL 47 LDL 70   12/2019 TC 01/2020 TG 64 HDL 51 LDL 55 - labs followed by pcp   Past Medical History:  Diagnosis Date   Anemia    CAD (coronary artery disease)    a. CTO of LAD known since 2008, s/p DES x 2 to CTO 01/01/13. b. known residual nonobstructive disease in LCx, RCA.   COPD (chronic obstructive pulmonary disease) (HCC)    GERD (gastroesophageal reflux disease)    Gout    Hyperlipidemia    Hypertension    Mitral regurgitation    Prev mild, most recently trivial by echo 11/2012.   Orthostatic hypotension 06/04/2017   Parkinson's disease    Sleep apnea      Allergies  Allergen Reactions   Amantadines     Hallucinations   Sulfonamide Derivatives      Current Outpatient Medications  Medication Sig Dispense Refill   mirtazapine (REMERON) 15 MG tablet TAKE 1 TABLET BY MOUTH AT BEDTIME 90 tablet 1   acetaminophen (TYLENOL) 650 MG CR tablet Take 1,300 mg by mouth every morning.     albuterol (PROVENTIL) (2.5 MG/3ML) 0.083% nebulizer solution Take 3 mLs (2.5 mg total) by  nebulization every 4 (four) hours as needed for wheezing or shortness of breath. 75 mL 11   albuterol (VENTOLIN HFA) 108 (90 Base) MCG/ACT inhaler Inhale 2 puffs into the lungs every 4 (four) hours as needed for wheezing or shortness of breath. 8 g 5   aspirin 81 MG tablet Take 81 mg by mouth daily. Reported on 01/24/2016     carbidopa-levodopa (SINEMET CR) 50-200 MG tablet 1/2 tablet in the morning and midday, 1 tablet at 6 PM and Midnight 270 tablet 3   carbidopa-levodopa (SINEMET IR) 25-100 MG tablet TAKE 1 TABLET BY MOUTH EVERY MORNING, AT NOON AND TAKE 1/2 TABLET BY MOUTH EVERY EVENING 75 tablet 5   doxazosin (CARDURA) 2 MG tablet Take 2 mg by mouth at bedtime.       famotidine (PEPCID) 20 MG tablet TAKE (1) TABLET TWICE DAILY. 60 tablet 3   Glycopyrrolate-Formoterol (BEVESPI AEROSPHERE) 9-4.8 MCG/ACT AERO Inhale 2 puffs into the lungs in the morning and at bedtime. 10.7 g 6   nitroGLYCERIN (NITROSTAT) 0.4 MG SL tablet Place 1 tablet (0.4 mg total) under the tongue every 5 (five) minutes as needed. 25 tablet 3   QUEtiapine (SEROQUEL) 25 MG tablet TAKE 1 TABLET BY  MOUTH AT BEDTIME 90 tablet 3   ropinirole (REQUIP) 5 MG tablet TAKE 1 TABLET BY MOUTH BEFORE bedtime 90 tablet 2   simvastatin (ZOCOR) 40 MG tablet Take 1 tablet by mouth daily. Changed by Dr. Margo Common     No current facility-administered medications for this visit.     Past Surgical History:  Procedure Laterality Date   CARDIAC CATHETERIZATION  2008   CORONARY ANGIOPLASTY WITH STENT PLACEMENT  01/01/2013   "2" (01/01/2013)   LUMBAR DISC SURGERY  1974   PERCUTANEOUS CORONARY STENT INTERVENTION (PCI-S) N/A 01/01/2013   Procedure: PERCUTANEOUS CORONARY STENT INTERVENTION (PCI-S);  Surgeon: Peter M Swaziland, MD;  Location: [facility] CATH LAB;  Service: Cardiovascular;  Laterality: N/A;   TONSILLECTOMY AND ADENOIDECTOMY  1947   TRIGGER FINGER RELEASE  1980's   VASECTOMY       Allergies  Allergen Reactions   Amantadines     Hallucinations    Sulfonamide Derivatives       Family History  Problem Relation Age of Onset   Ovarian cancer Mother    Coronary artery disease Father    Alzheimer's disease Father    Diabetes Brother    Diabetes Other    Transient ischemic attack Other      Social History Jorge Lester reports that he quit smoking about 31 years ago. His smoking use included cigarettes. He started smoking about 75 years ago. He has a 88.00 pack-year smoking history. He has never used smokeless tobacco. Mr. Grabel reports no history of alcohol use.   Review of Systems CONSTITUTIONAL: No weight loss, fever, chills, weakness or fatigue.  HEENT: Eyes: No visual loss, blurred vision, double vision or yellow sclerae.No hearing loss, sneezing, congestion, runny nose or sore throat.  SKIN: No rash or itching.  CARDIOVASCULAR: per hpi RESPIRATORY: No shortness of breath, cough or sputum.  GASTROINTESTINAL: No anorexia, nausea, vomiting or diarrhea. No abdominal pain or blood.  GENITOURINARY: No burning on urination, no polyuria NEUROLOGICAL: No headache, dizziness, syncope, paralysis, ataxia, numbness or tingling in the extremities. No change in bowel or bladder control.  MUSCULOSKELETAL: No muscle, back pain, joint pain or stiffness.  LYMPHATICS: No enlarged nodes. No history of splenectomy.  PSYCHIATRIC: No history of depression or anxiety.  ENDOCRINOLOGIC: No reports of sweating, cold or heat intolerance. No polyuria or polydipsia.  [name]   Physical Examination Today's Vitals   01/04/21 1303  BP: 120/72  Pulse: 98  Weight: 147 lb (66.7 kg)  Height: 5\' 8"  (1.727 m)   Body mass index is 22.35 kg/m.  Gen: resting comfortably, no acute distress HEENT: no scleral icterus, pupils equal round and reactive, no palptable cervical adenopathy,  CV: RRR, no m/r/g, no jvd Resp: Clear to auscultation bilaterally GI: abdomen is soft, non-tender, non-distended, normal bowel sounds, no hepatosplenomegaly MSK: extremities  are warm, no edema.  Skin: warm, no rash Neuro:  no focal deficits Psych: appropriate affect   Diagnostic Studies 11/2012 Cath   Procedural Findings:   Hemodynamics:   AO 117/52 with a mean of 79 mmHg   LV 116/14 mmHg   Coronary angiography:   Coronary dominance: right   Left mainstem: The left main coronary is normal.   Left anterior descending (LAD): The left anterior descending artery is diffusely diseased in the proximal vessel up to 75%. It is occluded in the mid vessel. There are left to left collaterals from the ramus intermediate Harumi Yamin. There are also right-to-left collaterals via septal perforators to the distal LAD. The proximal Is  well-defined and there appears to be a small tract through the occlusion.   There is a ramus intermediate Peg Fifer which is moderate to large. It has mild irregularities less than 20%.   Left circumflex (LCx): The left circumflex gives rise to a single bifurcating marginal Vanita Cannell. The proximal OM has a segmental 60% stenosis.   Right coronary artery (RCA): The right coronary is a large dominant vessel. It is diffusely diseased throughout with stenosis up to 40% in the mid vessel. There is significant plaque burden throughout without significant stenosis.   Left ventriculography: Left ventricular systolic function is normal, LVEF is estimated at 55-65%, there is no significant mitral regurgitation   Final Conclusions:   1. Single vessel occlusive coronary disease with chronic total occlusion of the mid LAD.   2. Normal left ventricular function.   Recommendations: The patient has significant symptoms and ischemia on noninvasive testing despite 2 antianginal agents. His LAD CTO appears suitable for PCI. The alternative would be to increase his antianginal therapy. I will discuss options with the patient.     01/01/13   Lesion Data:   Vessel: LAD   Percent stenosis (pre): 100%   TIMI-flow (pre): 0   Stent: 2.5 x 38 and 2.75 x 12 mm Promus premier stents    Percent stenosis (post): 0%   TIMI-flow (post): 3   Conclusions:   Successful stenting of the mid LAD for CTO with drug-eluting stents.     11/2012 Echo   LVEF 55-60%, grade II diastolic dysfunction,     11/2015 echo Study Conclusions   - Left ventricle: The cavity size was normal. Wall thickness was   normal. Systolic function was normal. The estimated ejection   fraction was in the range of 55% to 60%. Wall motion was normal;   there were no regional wall motion abnormalities. Doppler   parameters are consistent with abnormal left ventricular   relaxation (grade 1 diastolic dysfunction). - Aortic valve: There was mild regurgitation. Valve area (VTI):   2.49 cm^2. Valve area (Vmax): 2.39 cm^2. Valve area (Vmean): 2.42   cm^2. - Mitral valve: There was mild regurgitation. - Right ventricle: The cavity size was mildly dilated. - Right atrium: The atrium was mildly dilated. - Technically adequate study.   12/2015 MPI Perfusion Summary Defect 1:  There is a small defect of mild severity present in the apical anterior location. The defect is partially reversible. Small, mild intensity, partially reversible apical anterior defect suggesting a minor region of ischemia.  Defect 2:  There is a small defect of mild severity present in the mid inferoseptal and apical inferior location. The defect is non-reversible. Small, mild intensity, mid to apical inferior/inferoseptal defect consistent with soft tissue attenuation.      Overall Study Impression Myocardial perfusion is abnormal. This is a low risk study. Overall left ventricular systolic function was normal. Nuclear stress EF: 66%      08/2016 PFTs +COPD     01/2019 echo IMPRESSIONS      1. The left ventricle has normal systolic function, with an ejection fraction of 55-60%. The cavity size was normal. There is mildly increased left ventricular wall thickness. Left ventricular diastolic Doppler parameters are consistent with  impaired relaxation. No evidence of left ventricular regional wall motion abnormalities.  2. The right ventricle has normal systolic function. The cavity was normal. There is no increase in right ventricular wall thickness. Right ventricular systolic pressure could not be assessed.  3. The aortic valve is tricuspid.  Mild calcification of the aortic valve. Aortic valve regurgitation is mild by color flow Doppler. Mild aortic annular calcification noted.  4. The mitral valve is grossly normal. There is mild mitral annular calcification present.  5. The tricuspid valve is grossly normal.  6. The aortic root is normal in size and structure.     Assessment and Plan  1. CAD   - no recent symptoms - medical therapy limited by orthostatic hypotension - he will continue current meds        2. Orthostatic hypotension - secondary to Parkinsons - no symptoms, has done well with just aggressive hydration alone - continue to monitor.      3. Hyperlipidemia   - he changed back to simva 40mg  on his own, no side effects on atorva 80 but uncomfortable taking that dose. - LDL has been at goal, labs followed by pcp         Antoine PocheJonathan F. Donya Hitch, M.D.

## 2021-01-04 NOTE — Patient Instructions (Addendum)

## 2021-01-12 ENCOUNTER — Inpatient Hospital Stay (HOSPITAL_COMMUNITY)
Admission: EM | Admit: 2021-01-12 | Discharge: 2021-01-17 | DRG: 481 | Disposition: A | Discharge status: Skilled Nursing Facility | Payer: Medicare Other | Attending: Family Medicine | Admitting: Family Medicine

## 2021-01-12 ENCOUNTER — Other Ambulatory Visit: Payer: Self-pay

## 2021-01-12 ENCOUNTER — Encounter (HOSPITAL_COMMUNITY): Payer: Self-pay

## 2021-01-12 ENCOUNTER — Emergency Department (HOSPITAL_COMMUNITY): Payer: Medicare Other

## 2021-01-12 DIAGNOSIS — S72141A Displaced intertrochanteric fracture of right femur, initial encounter for closed fracture: Principal | ICD-10-CM | POA: Diagnosis present

## 2021-01-12 DIAGNOSIS — N179 Acute kidney failure, unspecified: Secondary | ICD-10-CM | POA: Diagnosis present

## 2021-01-12 DIAGNOSIS — K5900 Constipation, unspecified: Secondary | ICD-10-CM | POA: Diagnosis present

## 2021-01-12 DIAGNOSIS — W010XXA Fall on same level from slipping, tripping and stumbling without subsequent striking against object, initial encounter: Secondary | ICD-10-CM | POA: Diagnosis present

## 2021-01-12 DIAGNOSIS — M109 Gout, unspecified: Secondary | ICD-10-CM | POA: Diagnosis present

## 2021-01-12 DIAGNOSIS — S72001A Fracture of unspecified part of neck of right femur, initial encounter for closed fracture: Secondary | ICD-10-CM

## 2021-01-12 DIAGNOSIS — Z79899 Other long term (current) drug therapy: Secondary | ICD-10-CM

## 2021-01-12 DIAGNOSIS — S7291XA Unspecified fracture of right femur, initial encounter for closed fracture: Secondary | ICD-10-CM | POA: Diagnosis present

## 2021-01-12 DIAGNOSIS — N400 Enlarged prostate without lower urinary tract symptoms: Secondary | ICD-10-CM | POA: Diagnosis present

## 2021-01-12 DIAGNOSIS — R000 Tachycardia, unspecified: Secondary | ICD-10-CM | POA: Diagnosis present

## 2021-01-12 DIAGNOSIS — D72829 Elevated white blood cell count, unspecified: Secondary | ICD-10-CM | POA: Diagnosis not present

## 2021-01-12 DIAGNOSIS — Z87891 Personal history of nicotine dependence: Secondary | ICD-10-CM

## 2021-01-12 DIAGNOSIS — Z955 Presence of coronary angioplasty implant and graft: Secondary | ICD-10-CM

## 2021-01-12 DIAGNOSIS — D62 Acute posthemorrhagic anemia: Secondary | ICD-10-CM | POA: Diagnosis not present

## 2021-01-12 DIAGNOSIS — I10 Essential (primary) hypertension: Secondary | ICD-10-CM | POA: Diagnosis present

## 2021-01-12 DIAGNOSIS — Z882 Allergy status to sulfonamides status: Secondary | ICD-10-CM | POA: Diagnosis not present

## 2021-01-12 DIAGNOSIS — G20 Parkinson's disease: Secondary | ICD-10-CM | POA: Diagnosis present

## 2021-01-12 DIAGNOSIS — Z01818 Encounter for other preprocedural examination: Secondary | ICD-10-CM

## 2021-01-12 DIAGNOSIS — M6530 Trigger finger, unspecified finger: Secondary | ICD-10-CM | POA: Diagnosis present

## 2021-01-12 DIAGNOSIS — E782 Mixed hyperlipidemia: Secondary | ICD-10-CM

## 2021-01-12 DIAGNOSIS — Z20822 Contact with and (suspected) exposure to covid-19: Secondary | ICD-10-CM | POA: Diagnosis present

## 2021-01-12 DIAGNOSIS — I2510 Atherosclerotic heart disease of native coronary artery without angina pectoris: Secondary | ICD-10-CM | POA: Diagnosis present

## 2021-01-12 DIAGNOSIS — I480 Paroxysmal atrial fibrillation: Secondary | ICD-10-CM | POA: Diagnosis present

## 2021-01-12 DIAGNOSIS — E785 Hyperlipidemia, unspecified: Secondary | ICD-10-CM | POA: Diagnosis present

## 2021-01-12 DIAGNOSIS — Z888 Allergy status to other drugs, medicaments and biological substances status: Secondary | ICD-10-CM

## 2021-01-12 DIAGNOSIS — F05 Delirium due to known physiological condition: Secondary | ICD-10-CM | POA: Diagnosis not present

## 2021-01-12 DIAGNOSIS — E860 Dehydration: Secondary | ICD-10-CM | POA: Diagnosis present

## 2021-01-12 DIAGNOSIS — J449 Chronic obstructive pulmonary disease, unspecified: Secondary | ICD-10-CM | POA: Diagnosis present

## 2021-01-12 DIAGNOSIS — Z8041 Family history of malignant neoplasm of ovary: Secondary | ICD-10-CM

## 2021-01-12 DIAGNOSIS — I951 Orthostatic hypotension: Secondary | ICD-10-CM | POA: Diagnosis present

## 2021-01-12 DIAGNOSIS — Y92009 Unspecified place in unspecified non-institutional (private) residence as the place of occurrence of the external cause: Secondary | ICD-10-CM | POA: Diagnosis not present

## 2021-01-12 DIAGNOSIS — K219 Gastro-esophageal reflux disease without esophagitis: Secondary | ICD-10-CM | POA: Diagnosis present

## 2021-01-12 DIAGNOSIS — G4700 Insomnia, unspecified: Secondary | ICD-10-CM | POA: Diagnosis present

## 2021-01-12 DIAGNOSIS — D649 Anemia, unspecified: Secondary | ICD-10-CM

## 2021-01-12 DIAGNOSIS — W19XXXA Unspecified fall, initial encounter: Secondary | ICD-10-CM

## 2021-01-12 DIAGNOSIS — Z8249 Family history of ischemic heart disease and other diseases of the circulatory system: Secondary | ICD-10-CM

## 2021-01-12 DIAGNOSIS — I252 Old myocardial infarction: Secondary | ICD-10-CM

## 2021-01-12 DIAGNOSIS — Z7982 Long term (current) use of aspirin: Secondary | ICD-10-CM

## 2021-01-12 DIAGNOSIS — Z833 Family history of diabetes mellitus: Secondary | ICD-10-CM

## 2021-01-12 DIAGNOSIS — Z9889 Other specified postprocedural states: Secondary | ICD-10-CM

## 2021-01-12 DIAGNOSIS — G4730 Sleep apnea, unspecified: Secondary | ICD-10-CM | POA: Diagnosis present

## 2021-01-12 DIAGNOSIS — Z820 Family history of epilepsy and other diseases of the nervous system: Secondary | ICD-10-CM

## 2021-01-12 LAB — BASIC METABOLIC PANEL
Anion gap: 8 (ref 5–15)
BUN: 42 mg/dL — ABNORMAL HIGH (ref 8–23)
CO2: 26 mmol/L (ref 22–32)
Calcium: 8.7 mg/dL — ABNORMAL LOW (ref 8.9–10.3)
Chloride: 105 mmol/L (ref 98–111)
Creatinine, Ser: 1.74 mg/dL — ABNORMAL HIGH (ref 0.61–1.24)
GFR, Estimated: 38 mL/min — ABNORMAL LOW (ref >60)
Glucose, Bld: 120 mg/dL — ABNORMAL HIGH (ref 70–99)
Potassium: 4.6 mmol/L (ref 3.5–5.1)
Sodium: 139 mmol/L (ref 135–145)

## 2021-01-12 LAB — CBC WITH DIFFERENTIAL/PLATELET
Abs Immature Granulocytes: 0.03 10*3/uL (ref 0.00–0.07)
Basophils Absolute: 0.1 10*3/uL (ref 0.0–0.1)
Basophils Relative: 1 %
Eosinophils Absolute: 0 10*3/uL (ref 0.0–0.5)
Eosinophils Relative: 0 %
HCT: 39.9 % (ref 39.0–52.0)
Hemoglobin: 12.8 g/dL — ABNORMAL LOW (ref 13.0–17.0)
Immature Granulocytes: 0 %
Lymphocytes Relative: 7 %
Lymphs Abs: 0.6 10*3/uL — ABNORMAL LOW (ref 0.7–4.0)
MCH: 30.8 pg (ref 26.0–34.0)
MCHC: 32.1 g/dL (ref 30.0–36.0)
MCV: 96.1 fL (ref 80.0–100.0)
Monocytes Absolute: 0.7 10*3/uL (ref 0.1–1.0)
Monocytes Relative: 8 %
Neutro Abs: 7.1 10*3/uL (ref 1.7–7.7)
Neutrophils Relative %: 84 %
Platelets: 178 10*3/uL (ref 150–400)
RBC: 4.15 MIL/uL — ABNORMAL LOW (ref 4.22–5.81)
RDW: 13.1 % (ref 11.5–15.5)
WBC: 8.4 10*3/uL (ref 4.0–10.5)
nRBC: 0 % (ref 0.0–0.2)

## 2021-01-12 LAB — RESP PANEL BY RT-PCR (FLU A&B, COVID) ARPGX2
Influenza A by PCR: NEGATIVE
Influenza B by PCR: NEGATIVE
SARS Coronavirus 2 by RT PCR: NEGATIVE

## 2021-01-12 MED ORDER — FAMOTIDINE 20 MG PO TABS
20.0000 mg | ORAL_TABLET | Freq: Every day | ORAL | Status: DC
  Administered 2021-01-12 – 2021-01-17 (×5): 20 mg via ORAL
  Filled 2021-01-12 (×5): qty 1

## 2021-01-12 MED ORDER — SODIUM CHLORIDE 0.9 % IV BOLUS
500.0000 mL | Freq: Once | INTRAVENOUS | Status: AC
  Administered 2021-01-12: 500 mL via INTRAVENOUS

## 2021-01-12 MED ORDER — ALBUTEROL SULFATE (2.5 MG/3ML) 0.083% IN NEBU: 2.5000 mg | INHALATION_SOLUTION | RESPIRATORY_TRACT | Status: DC | PRN

## 2021-01-12 MED ORDER — HYDROMORPHONE HCL 1 MG/ML IJ SOLN
0.5000 mg | INTRAMUSCULAR | Status: DC | PRN
  Administered 2021-01-12: 0.5 mg via INTRAVENOUS
  Filled 2021-01-12: qty 1

## 2021-01-12 MED ORDER — ALBUTEROL SULFATE HFA 108 (90 BASE) MCG/ACT IN AERS: 2.0000 | INHALATION_SPRAY | RESPIRATORY_TRACT | Status: DC | PRN

## 2021-01-12 MED ORDER — QUETIAPINE FUMARATE 25 MG PO TABS
25.0000 mg | ORAL_TABLET | Freq: Every day | ORAL | Status: DC
  Administered 2021-01-12 – 2021-01-16 (×5): 25 mg via ORAL
  Filled 2021-01-12 (×6): qty 1

## 2021-01-12 MED ORDER — SIMVASTATIN 20 MG PO TABS
40.0000 mg | ORAL_TABLET | Freq: Every day | ORAL | Status: DC
  Administered 2021-01-12 – 2021-01-17 (×5): 40 mg via ORAL
  Filled 2021-01-12 (×2): qty 2
  Filled 2021-01-12: qty 4
  Filled 2021-01-12 (×2): qty 2

## 2021-01-12 MED ORDER — HYDROMORPHONE HCL 1 MG/ML IJ SOLN
0.5000 mg | Freq: Once | INTRAMUSCULAR | Status: AC
  Administered 2021-01-12: 0.5 mg via INTRAVENOUS
  Filled 2021-01-12: qty 1

## 2021-01-12 MED ORDER — HYDROMORPHONE HCL 1 MG/ML IJ SOLN
0.5000 mg | INTRAMUSCULAR | Status: DC | PRN
  Administered 2021-01-12 – 2021-01-17 (×6): 0.5 mg via INTRAVENOUS
  Filled 2021-01-12 (×3): qty 0.5
  Filled 2021-01-12 (×2): qty 1
  Filled 2021-01-12 (×2): qty 0.5
  Filled 2021-01-12: qty 1

## 2021-01-12 MED ORDER — CARBIDOPA-LEVODOPA ER 50-200 MG PO TBCR
1.0000 | EXTENDED_RELEASE_TABLET | Freq: Every day | ORAL | Status: DC
  Administered 2021-01-12 – 2021-01-16 (×4): 1 via ORAL
  Filled 2021-01-12 (×8): qty 1

## 2021-01-12 MED ORDER — DOXAZOSIN MESYLATE 2 MG PO TABS
2.0000 mg | ORAL_TABLET | Freq: Every day | ORAL | Status: DC
  Administered 2021-01-12 – 2021-01-16 (×5): 2 mg via ORAL
  Filled 2021-01-12 (×5): qty 1

## 2021-01-12 MED ORDER — SODIUM CHLORIDE 0.9 % IV SOLN: Freq: Once | INTRAVENOUS | Status: AC

## 2021-01-12 NOTE — H&P (Signed)
History and Physical  Jorge Lester JJK:[id number] DOB: 09-23-1933 DOA: 01/12/2021  Referring physician: Bethann Berkshire, MD  PCP: Suzan Slick, MD  Patient coming from: Home  Chief Complaint: Right hip pain  HPI: Jorge Lester is a 87 y.o. male with medical history significant for CAD s/p stent placement, COPD, GERD, Parkinson disease, hyperlipidemia who presents to the emergency department after sustaining a fall at home few hours PTA.  Patient states that he folded a step down ladder and placed it against the wall at home when he accidentally stepped on it and lost his balance thereby sustaining a fall with subsequent right hip pain and difficulty in being able to sustain weight on the right leg.  EMS was activated, patient was taken to the ED for further evaluation and management.  Patient denies hitting his head and denies numbness or tingling of the extremities.   ED Course:  In the emergency department, he was tachycardic, BP was 154/69, other vital signs were within normal range.  Work-up in the ED showed normocytic anemia, BUN to creatinine 42/1.74 (no recent labs for comparison) Right hip x-ray showed acute inter trochanteric fracture of the proximal right femur. Dilaudid was given for pain, IV hydration was provided, orthopedic surgeon was consulted and will see patient in the morning per ED physician.  Hospitalist was asked to admit patient for further evaluation and management.  Review of Systems: Constitutional: Negative for chills and fever.  HENT: Negative for ear pain and sore throat.   Eyes: Negative for pain and visual disturbance.  Respiratory: Negative for cough, chest tightness and shortness of breath.   Cardiovascular: Negative for chest pain and palpitations.  Gastrointestinal: Negative for abdominal pain and vomiting.  Endocrine: Negative for polyphagia and polyuria.  Genitourinary: Negative for decreased urine volume, dysuria, enuresis Musculoskeletal:  Positive for right hip and thigh pain.  Negative for back pain.  Skin: Negative for color change and rash.  Allergic/Immunologic: Negative for immunocompromised state.  Neurological: Negative for tremors, syncope, speech difficulty.  Hematological: Does not bruise/bleed easily.  All other systems reviewed and are negative  Past Medical History:  Diagnosis Date   Anemia    CAD (coronary artery disease)    a. CTO of LAD known since 2008, s/p DES x 2 to CTO 01/01/13. b. known residual nonobstructive disease in LCx, RCA.   COPD (chronic obstructive pulmonary disease) (HCC)    GERD (gastroesophageal reflux disease)    Gout    Hyperlipidemia    Hypertension    Mitral regurgitation    Prev mild, most recently trivial by echo 11/2012.   Orthostatic hypotension 06/04/2017   Parkinson's disease    Sleep apnea    Past Surgical History:  Procedure Laterality Date   CARDIAC CATHETERIZATION  2008   CORONARY ANGIOPLASTY WITH STENT PLACEMENT  01/01/2013   "2" (01/01/2013)   LUMBAR DISC SURGERY  1974   PERCUTANEOUS CORONARY STENT INTERVENTION (PCI-S) N/A 01/01/2013   Procedure: PERCUTANEOUS CORONARY STENT INTERVENTION (PCI-S);  Surgeon: Peter M Swaziland, MD;  Location: [facility] CATH LAB;  Service: Cardiovascular;  Laterality: N/A;   TONSILLECTOMY AND ADENOIDECTOMY  1947   TRIGGER FINGER RELEASE  1980's   VASECTOMY      Social History:  reports that he quit smoking about 31 years ago. His smoking use included cigarettes. He started smoking about 75 years ago. He has a 88.00 pack-year smoking history. He has never used smokeless tobacco. He reports that he does not drink alcohol and does not  use drugs.   Allergies  Allergen Reactions   Amantadines     Hallucinations   Sulfonamide Derivatives     Family History  Problem Relation Age of Onset   Ovarian cancer Mother    Coronary artery disease Father    Alzheimer's disease Father    Diabetes Brother    Diabetes Other    Transient ischemic attack  Other      Prior to Admission medications   Medication Sig Start Date End Date Taking? Authorizing Provider  acetaminophen (TYLENOL) 650 MG CR tablet Take 1,300 mg by mouth every morning.    [provider]  albuterol (PROVENTIL) (2.5 MG/3ML) 0.083% nebulizer solution Take 3 mLs (2.5 mg total) by nebulization every 4 (four) hours as needed for wheezing or shortness of breath. 11/03/19   Steffanie Dunn, DO  albuterol (VENTOLIN HFA) 108 (90 Base) MCG/ACT inhaler Inhale 2 puffs into the lungs every 4 (four) hours as needed for wheezing or shortness of breath. 01/15/20   Bevelyn Ngo, NP  aspirin 81 MG tablet Take 81 mg by mouth daily. Reported on 01/24/2016    [provider]  carbidopa-levodopa (SINEMET CR) 50-200 MG tablet 1/2 tablet in the morning and midday, 1 tablet at 6 PM and Midnight 10/12/20   York Spaniel, MD  carbidopa-levodopa (SINEMET IR) 25-100 MG tablet TAKE 1 TABLET BY MOUTH EVERY MORNING, AT NOON AND TAKE 1/2 TABLET BY MOUTH EVERY EVENING 08/22/20   York Spaniel, MD  doxazosin (CARDURA) 2 MG tablet Take 2 mg by mouth at bedtime.      [provider]  famotidine (PEPCID) 20 MG tablet TAKE (1) TABLET TWICE DAILY. 03/07/15   Antoine Poche, MD  Glycopyrrolate-Formoterol (BEVESPI AEROSPHERE) 9-4.8 MCG/ACT AERO Inhale 2 puffs into the lungs in the morning and at bedtime. 06/30/20   Steffanie Dunn, DO  mirtazapine (REMERON) 15 MG tablet TAKE 1 TABLET BY MOUTH AT BEDTIME 12/20/20   York Spaniel, MD  nitroGLYCERIN (NITROSTAT) 0.4 MG SL tablet Place 1 tablet (0.4 mg total) under the tongue every 5 (five) minutes as needed. 11/13/12   Serpe, Clide Deutscher, PA-C  QUEtiapine (SEROQUEL) 25 MG tablet TAKE 1 TABLET BY MOUTH AT BEDTIME 05/24/20   York Spaniel, MD  ropinirole (REQUIP) 5 MG tablet TAKE 1 TABLET BY MOUTH BEFORE bedtime 11/21/20   York Spaniel, MD  simvastatin (ZOCOR) 40 MG tablet Take 1 tablet by mouth daily. Changed by Dr. Margo Common 11/28/15    [provider]    Physical Exam: BP (!) 154/89 (BP Location: Right Arm)   Pulse (!) 107   Temp 99.2 F (37.3 C) (Oral)   Resp 20   Ht 5\' 10"  (1.778 m)   Wt 67.6 kg   SpO2 97%   BMI 21.38 kg/m   General: 87 y.o. year-old male well developed in no acute distress.  Alert and oriented x3. HEENT: Dry mucous membrane.  NCAT, EOMI Neck: Supple, trachea medial Cardiovascular: Regular rate and rhythm with no rubs or gallops.  No thyromegaly or JVD noted.  No lower extremity edema. 2/4 pulses in all 4 extremities. Respiratory: Clear to auscultation with no wheezes or rales. Good inspiratory effort. Abdomen: Soft, nontender nondistended with normal bowel sounds x4 quadrants. Muskuloskeletal: Restricted ROM of RLE due to pain.  Tender to palpation of right hip/thigh.  No cyanosis, clubbing or edema noted bilaterally Neuro: CN II-XII intact, strength 5/5 x 4, sensation, reflexes intact Skin: No ulcerative lesions noted  or rashes Psychiatry:  Mood is appropriate for condition and setting          Labs on Admission:  Basic Metabolic Panel: Recent Labs  Lab 01/12/21 1752  NA 139  K 4.6  CL 105  CO2 26  GLUCOSE 120*  BUN 42*  CREATININE 1.74*  CALCIUM 8.7*   Liver Function Tests: No results for input(s): AST, ALT, ALKPHOS, BILITOT, PROT, ALBUMIN in the last 168 hours. No results for input(s): LIPASE, AMYLASE in the last 168 hours. No results for input(s): AMMONIA in the last 168 hours. CBC: Recent Labs  Lab 01/12/21 1752  WBC 8.4  NEUTROABS 7.1  HGB 12.8*  HCT 39.9  MCV 96.1  PLT 178   Cardiac Enzymes: No results for input(s): CKTOTAL, CKMB, CKMBINDEX, TROPONINI in the last 168 hours.  BNP (last 3 results) No results for input(s): BNP in the last 8760 hours.  ProBNP (last 3 results) No results for input(s): PROBNP in the last 8760 hours.  CBG: No results for input(s): GLUCAP in the last 168 hours.  Radiological Exams on Admission: DG Hip Unilat W or Wo  Pelvis 2-3 Views Left  Result Date: 01/12/2021 CLINICAL DATA:  Status post fall. EXAM: DG HIP (WITH OR WITHOUT PELVIS) 2-3V LEFT COMPARISON:  None. FINDINGS: Acute, comminuted fracture deformity is seen extending through the inter trochanteric region of the proximal right femur. There is no evidence of dislocation. Mild degenerative changes are seen in the form of joint space narrowing and acetabular sclerosis. Moderate to marked severity vascular calcification is seen. IMPRESSION: Acute inter trochanteric fracture of the proximal right femur. Electronically Signed   By: Aram Candelahaddeus  Houston M.D.   On: 01/12/2021 18:14    EKG: I independently viewed the EKG done and my findings are as followed: A. fib with RVR  Assessment/Plan Present on Admission:  Right femoral fracture (HCC)  Coronary artery disease involving native coronary artery of native heart without angina pectoris  Hyperlipidemia  PARKINSON'S DISEASE  Essential hypertension  Principal Problem:   Right femoral fracture (HCC) Active Problems:   PARKINSON'S DISEASE   Essential hypertension   Coronary artery disease involving native coronary artery of native heart without angina pectoris   Hyperlipidemia   Paroxysmal atrial fibrillation with RVR (HCC)   Normocytic anemia   AKI (acute kidney injury) (HCC)   Dehydration  Right proximal femoral intertrochanteric fracture secondary to accidental fall Continue IV Dilaudid 0.5 mg every 4 hours as needed Continue fall precaution and neurochecks Continue PT/OT eval and treat after surgical intervention Orthopedic surgery already consulted with plan to take patient to the OR tomorrow  Paroxysmal atrial fibrillation with RVR CHA2DS2- VASc score   is = 4.8    Which is  equal to =  4.8% annual risk of stroke  Continue telemetry Rate is currently controlled, continue to monitor and manage accordingly No anticoagulant at this time due to impending surgical procedure in the morning  Acute  kidney injury with no known history of CKD BUN to creatinine 42/1.74 (no recent labs for comparison) Continue gentle hydration Renally adjust medications, avoid nephrotoxic agents/dehydration/hypotension  Dehydration Continue hydration  Normocytic anemia Hemoglobin stable at 12.8 (baseline hemoglobin at 12)  Essential hypertension No BP meds noted on med rec Continue to monitor her BP and treat accordingly  CAD s/p stent placement Continue Zocor Resume antiplatelets s/p surgical intervention  Hyperlipidemia Continue Zocor  Parkinson disease Continue Sinemet  BPH Continue doxazosin  COPD Continue albuterol as needed  Insomnia Continue Seroquel  DVT prophylaxis: SCDs  Code Status: Full code  Family Communication: None at bedside  Disposition Plan:  Patient is from:                        home Anticipated DC to:                   SNF or family members home Anticipated DC date:               2-3 days Anticipated DC barriers:          Patient is unstable to be discharged at this time due to right femoral fracture pending surgical repair    Consults called: Orthopedic surgery (by ED team)  Admission status: Inpatient    Frankey Shown MD [facility]  01/12/2021, 8:06 PM

## 2021-01-12 NOTE — ED Notes (Signed)
Pt belongings (watch, money, wallet) given to security. Pt daughter came by and was updated and given pt belongings. Pt stated that it was ok for belongings to go with daughter.

## 2021-01-12 NOTE — ED Triage Notes (Signed)
Pt brought to ED via [facility] for fall from home. Pt tripped over ladder, denies LOC or hitting head. Pt c/o right hip pain

## 2021-01-12 NOTE — Progress Notes (Signed)
Patient ID: Jorge Lester, male   DOB: 05/08/1934, 87 y.o.   MRN: [id number]  Chief Complaint  Patient presents with   Fall    Rt hip pain   87 year old male coronary artery disease just saw cardiology 8 days ago nothing new was added to his regimen  Apparently having some atrial fibrillation on his EKG  If medicine can get that under control we can do his surgery tomorrow.  N.p.o. after midnight stop all anticoagulants please    Past Medical History:  Diagnosis Date   Anemia    CAD (coronary artery disease)    a. CTO of LAD known since 2008, s/p DES x 2 to CTO 01/01/13. b. known residual nonobstructive disease in LCx, RCA.   COPD (chronic obstructive pulmonary disease) (HCC)    GERD (gastroesophageal reflux disease)    Gout    Hyperlipidemia    Hypertension    Mitral regurgitation    Prev mild, most recently trivial by echo 11/2012.   Orthostatic hypotension 06/04/2017   Parkinson's disease    Sleep apnea    Past Surgical History:  Procedure Laterality Date   CARDIAC CATHETERIZATION  2008   CORONARY ANGIOPLASTY WITH STENT PLACEMENT  01/01/2013   "2" (01/01/2013)   LUMBAR DISC SURGERY  1974   PERCUTANEOUS CORONARY STENT INTERVENTION (PCI-S) N/A 01/01/2013   Procedure: PERCUTANEOUS CORONARY STENT INTERVENTION (PCI-S);  Surgeon: Peter M Swaziland, MD;  Location: [facility] CATH LAB;  Service: Cardiovascular;  Laterality: N/A;   TONSILLECTOMY AND ADENOIDECTOMY  1947   TRIGGER FINGER RELEASE  1980's   VASECTOMY

## 2021-01-12 NOTE — ED Provider Notes (Signed)
[facility] EMERGENCY DEPARTMENT Provider Note   CSN: [id number] Arrival date & time: 01/12/21  1705     History Chief Complaint  Patient presents with   Jorge Lester    Jorge Lester is a 87 y.o. male.  Patient fell today and complains of right hip pain.  Patient did not hit his head.  Patient has a history of atrial for the  The history is provided by the patient and medical records. No language interpreter was used.  Fall This is a new problem. The current episode started 6 to 12 hours ago. The problem occurs rarely. The problem has not changed since onset.Pertinent negatives include no chest pain, no abdominal pain and no headaches. Nothing aggravates the symptoms. Nothing relieves the symptoms. He has tried nothing for the symptoms.      Past Medical History:  Diagnosis Date   Anemia    CAD (coronary artery disease)    a. CTO of LAD known since 2008, s/p DES x 2 to CTO 01/01/13. b. known residual nonobstructive disease in LCx, RCA.   COPD (chronic obstructive pulmonary disease) (HCC)    GERD (gastroesophageal reflux disease)    Gout    Hyperlipidemia    Hypertension    Mitral regurgitation    Prev mild, most recently trivial by echo 11/2012.   Orthostatic hypotension 06/04/2017   Parkinson's disease    Sleep apnea     Patient Active Problem List   Diagnosis Date Noted   Right femoral fracture (HCC) 01/12/2021   Orthostatic hypotension 06/04/2017   Visual hallucinations 10/04/2014   Coronary artery disease involving native coronary artery of native heart without angina pectoris    Hyperlipidemia    Hypertension    Exertional dyspnea 11/13/2012   Abnormality of gait 07/22/2012   Insomnia, unspecified 07/22/2012   Mitral regurgitation 11/04/2010   GLUCOSE INTOLERANCE 02/28/2009   PARKINSON'S DISEASE 02/28/2009   RENAL DISEASE, CHRONIC, MILD 02/28/2009   DYSLIPIDEMIA 06/23/2008   HYPERTENSION 06/23/2008   EDEMA 06/23/2008    Past Surgical History:  Procedure  Laterality Date   CARDIAC CATHETERIZATION  2008   CORONARY ANGIOPLASTY WITH STENT PLACEMENT  01/01/2013   "2" (01/01/2013)   LUMBAR DISC SURGERY  1974   PERCUTANEOUS CORONARY STENT INTERVENTION (PCI-S) N/A 01/01/2013   Procedure: PERCUTANEOUS CORONARY STENT INTERVENTION (PCI-S);  Surgeon: Peter M Swaz[place], MD;  Location: [facility] CATH LAB;  Service: Cardiovascular;  Laterality: N/A;   TONSILLECTOMY AND ADENOIDECTOMY  1947   TRIGGER FINGER RELEASE  1980's   VASECTOMY         Family History  Problem Relation Age of Onset   Ovarian cancer Mother    Coronary artery disease Father    Alzheimer's disease Father    Diabetes Brother    Diabetes Other    Transient ischemic attack Other     Social History   Tobacco Use   Smoking status: Former    Packs/day: 2.00    Years: 44.00    Pack years: 88.00    Types: Cigarettes    Start date: 07/16/1945    Quit date: 07/16/1989    Years since quitting: 31.5   Smokeless tobacco: Never  Vaping Use   Vaping Use: Never used  Substance Use Topics   Alcohol use: No    Alcohol/week: 0.0 standard drinks    Comment: 01/01/2013 "last alcohol was 1983"   Drug use: No    Home Medications Prior to Admission medications   Medication Sig Start Date End Date Taking?  Authorizing Provider  acetaminophen (TYLENOL) 650 MG CR tablet Take 1,300 mg by mouth every morning.    [provider]  albuterol (PROVENTIL) (2.5 MG/3ML) 0.083% nebulizer solution Take 3 mLs (2.5 mg total) by nebulization every 4 (four) hours as needed for wheezing or shortness of breath. 11/03/19   Steffanie Dunn, DO  albuterol (VENTOLIN HFA) 108 (90 Base) MCG/ACT inhaler Inhale 2 puffs into the lungs every 4 (four) hours as needed for wheezing or shortness of breath. 01/15/20   Bevelyn Ngo, NP  aspirin 81 MG tablet Take 81 mg by mouth daily. Reported on 01/24/2016    [provider]  carbidopa-levodopa (SINEMET CR) 50-200 MG tablet 1/2 tablet in the morning and midday, 1 tablet at 6  PM and Midnight 10/12/20   York Spaniel, MD  carbidopa-levodopa (SINEMET IR) 25-100 MG tablet TAKE 1 TABLET BY MOUTH EVERY MORNING, AT NOON AND TAKE 1/2 TABLET BY MOUTH EVERY EVENING 08/22/20   York Spaniel, MD  doxazosin (CARDURA) 2 MG tablet Take 2 mg by mouth at bedtime.      [provider]  famotidine (PEPCID) 20 MG tablet TAKE (1) TABLET TWICE DAILY. 03/07/15   Antoine Poche, MD  Glycopyrrolate-Formoterol (BEVESPI AEROSPHERE) 9-4.8 MCG/ACT AERO Inhale 2 puffs into the lungs in the morning and at bedtime. 06/30/20   Steffanie Dunn, DO  mirtazapine (REMERON) 15 MG tablet TAKE 1 TABLET BY MOUTH AT BEDTIME 12/20/20   York Spaniel, MD  nitroGLYCERIN (NITROSTAT) 0.4 MG SL tablet Place 1 tablet (0.4 mg total) under the tongue every 5 (five) minutes as needed. 11/13/12   Serpe, Clide Deutscher, PA-C  QUEtiapine (SEROQUEL) 25 MG tablet TAKE 1 TABLET BY MOUTH AT BEDTIME 05/24/20   York Spaniel, MD  ropinirole (REQUIP) 5 MG tablet TAKE 1 TABLET BY MOUTH BEFORE bedtime 11/21/20   York Spaniel, MD  simvastatin (ZOCOR) 40 MG tablet Take 1 tablet by mouth daily. Changed by Dr. Margo Common 11/28/15   [provider]    Allergies    Amantadines and Sulfonamide derivatives  Review of Systems   Review of Systems  Constitutional:  Negative for appetite change and fatigue.  HENT:  Negative for congestion, ear discharge and sinus pressure.   Eyes:  Negative for discharge.  Respiratory:  Negative for cough.   Cardiovascular:  Negative for chest pain.  Gastrointestinal:  Negative for abdominal pain and diarrhea.  Genitourinary:  Negative for frequency and hematuria.  Musculoskeletal:  Negative for back pain.       Right hip pain  Skin:  Negative for rash.  Neurological:  Negative for seizures and headaches.  Psychiatric/Behavioral:  Negative for hallucinations.    Physical Exam Updated Vital Signs BP (!) 154/89 (BP Location: Right Arm)   Pulse (!) 107   Temp 99.2 F (37.3 C)  (Oral)   Resp 20   Ht 5\' 10"  (1.778 m)   Wt 67.6 kg   SpO2 97%   BMI 21.38 kg/m   Physical Exam Vitals and nursing note reviewed.  Constitutional:      Appearance: He is well-developed.  HENT:     Head: Normocephalic.     Nose: Nose normal.  Eyes:     General: No scleral icterus.    Conjunctiva/sclera: Conjunctivae normal.  Neck:     Thyroid: No thyromegaly.  Cardiovascular:     Rate and Rhythm: Normal rate and regular rhythm.     Heart sounds: No murmur heard.  No friction rub. No gallop.  Pulmonary:     Breath sounds: No stridor. No wheezing or rales.  Chest:     Chest wall: No tenderness.  Abdominal:     General: There is no distension.     Tenderness: There is no abdominal tenderness. There is no rebound.  Musculoskeletal:        General: Normal range of motion.     Cervical back: Neck supple.     Comments: Tenderness deformity to the right hip  Lymphadenopathy:     Cervical: No cervical adenopathy.  Skin:    Findings: No erythema or rash.  Neurological:     Mental Status: He is alert and oriented to person, place, and time.     Motor: No abnormal muscle tone.     Coordination: Coordination normal.  Psychiatric:        Behavior: Behavior normal.    ED Results / Procedures / Treatments   Labs (all labs ordered are listed, but only abnormal results are displayed) Labs Reviewed  CBC WITH DIFFERENTIAL/PLATELET - Abnormal; Notable for the following components:      Result Value   RBC 4.15 (*)    Hemoglobin 12.8 (*)    Lymphs Abs 0.6 (*)    All other components within normal limits  BASIC METABOLIC PANEL - Abnormal; Notable for the following components:   Glucose, Bld 120 (*)    BUN 42 (*)    Creatinine, Ser 1.74 (*)    Calcium 8.7 (*)    GFR, Estimated 38 (*)    All other components within normal limits    EKG EKG Interpretation  Date/Time:  Thursday January 12 2021 17:15:02 EDT Ventricular Rate:  106 PR Interval:    QRS Duration: 90 QT  Interval:  342 QTC Calculation: 454 R Axis:   86 Text Interpretation: Atrial fibrillation with rapid ventricular response Anterior infarct , age undetermined Abnormal ECG Confirmed by Bethann Berkshire [id number]) on 01/12/2021 5:41:31 PM  Radiology DG Hip Unilat W or Wo Pelvis 2-3 Views Left  Result Date: 01/12/2021 CLINICAL DATA:  Status post fall. EXAM: DG HIP (WITH OR WITHOUT PELVIS) 2-3V LEFT COMPARISON:  None. FINDINGS: Acute, comminuted fracture deformity is seen extending through the inter trochanteric region of the proximal right femur. There is no evidence of dislocation. Mild degenerative changes are seen in the form of joint space narrowing and acetabular sclerosis. Moderate to marked severity vascular calcification is seen. IMPRESSION: Acute inter trochanteric fracture of the proximal right femur. Electronically Signed   By: Aram Candela M.D.   On: 01/12/2021 18:14    Procedures Procedures   Medications Ordered in ED Medications  sodium chloride 0.9 % bolus 500 mL (has no administration in time range)  HYDROmorphone (DILAUDID) injection 0.5 mg (0.5 mg Intravenous Given 01/12/21 1831)    ED Course  I have reviewed the triage vital signs and the nursing notes.  Pertinent labs & imaging results that were available during my care of the patient were reviewed by me and considered in my medical decision making (see chart for details). Patient with an intertrochanteric right hip fracture.  I spoke with Dr. Romeo Apple and he will do surgery tomorrow.  Medicine will admit.  Patient has a history of atrial for but is not on any blood thinners except for aspirin   MDM Rules/Calculators/A&P                         Acute right  hip fracture.  Medicine admitting with Ortho to do surgery tomorrow Final Clinical Impression(s) / ED Diagnoses Final diagnoses:  Closed fracture of right hip, initial encounter [facility](HCC)    Rx / DC Orders ED Discharge Orders     None        Bethann BerkshireZammit, Ark Agrusa,  MD 01/13/21 1044

## 2021-01-13 ENCOUNTER — Inpatient Hospital Stay (HOSPITAL_COMMUNITY): Payer: Medicare Other

## 2021-01-13 ENCOUNTER — Inpatient Hospital Stay (HOSPITAL_COMMUNITY): Payer: Medicare Other | Admitting: Anesthesiology

## 2021-01-13 ENCOUNTER — Encounter (HOSPITAL_COMMUNITY)
Admission: EM | Disposition: A | Discharge status: Skilled Nursing Facility | Payer: Self-pay | Source: Home / Self Care | Attending: Family Medicine

## 2021-01-13 DIAGNOSIS — S72141A Displaced intertrochanteric fracture of right femur, initial encounter for closed fracture: Principal | ICD-10-CM

## 2021-01-13 DIAGNOSIS — I10 Essential (primary) hypertension: Secondary | ICD-10-CM

## 2021-01-13 DIAGNOSIS — N179 Acute kidney failure, unspecified: Secondary | ICD-10-CM

## 2021-01-13 DIAGNOSIS — G4700 Insomnia, unspecified: Secondary | ICD-10-CM

## 2021-01-13 DIAGNOSIS — I480 Paroxysmal atrial fibrillation: Secondary | ICD-10-CM

## 2021-01-13 DIAGNOSIS — J449 Chronic obstructive pulmonary disease, unspecified: Secondary | ICD-10-CM

## 2021-01-13 DIAGNOSIS — S7291XA Unspecified fracture of right femur, initial encounter for closed fracture: Secondary | ICD-10-CM

## 2021-01-13 HISTORY — PX: INTRAMEDULLARY (IM) NAIL INTERTROCHANTERIC: SHX5875

## 2021-01-13 LAB — COMPREHENSIVE METABOLIC PANEL
ALT: 5 U/L (ref 0–44)
AST: 18 U/L (ref 15–41)
Albumin: 3.9 g/dL (ref 3.5–5.0)
Alkaline Phosphatase: 53 U/L (ref 38–126)
Anion gap: 5 (ref 5–15)
BUN: 39 mg/dL — ABNORMAL HIGH (ref 8–23)
CO2: 26 mmol/L (ref 22–32)
Calcium: 8.3 mg/dL — ABNORMAL LOW (ref 8.9–10.3)
Chloride: 106 mmol/L (ref 98–111)
Creatinine, Ser: 1.4 mg/dL — ABNORMAL HIGH (ref 0.61–1.24)
GFR, Estimated: 50 mL/min — ABNORMAL LOW (ref >60)
Glucose, Bld: 108 mg/dL — ABNORMAL HIGH (ref 70–99)
Potassium: 4.6 mmol/L (ref 3.5–5.1)
Sodium: 137 mmol/L (ref 135–145)
Total Bilirubin: 1.3 mg/dL — ABNORMAL HIGH (ref 0.3–1.2)
Total Protein: 6.7 g/dL (ref 6.5–8.1)

## 2021-01-13 LAB — CBC
HCT: 36.5 % — ABNORMAL LOW (ref 39.0–52.0)
Hemoglobin: 11.7 g/dL — ABNORMAL LOW (ref 13.0–17.0)
MCH: 30.5 pg (ref 26.0–34.0)
MCHC: 32.1 g/dL (ref 30.0–36.0)
MCV: 95.3 fL (ref 80.0–100.0)
Platelets: 133 10*3/uL — ABNORMAL LOW (ref 150–400)
RBC: 3.83 MIL/uL — ABNORMAL LOW (ref 4.22–5.81)
RDW: 13.1 % (ref 11.5–15.5)
WBC: 8.4 10*3/uL (ref 4.0–10.5)
nRBC: 0 % (ref 0.0–0.2)

## 2021-01-13 LAB — PHOSPHORUS: Phosphorus: 3.1 mg/dL (ref 2.5–4.6)

## 2021-01-13 LAB — PROTIME-INR
INR: 1.2 (ref 0.8–1.2)
Prothrombin Time: 14.8 seconds (ref 11.4–15.2)

## 2021-01-13 LAB — SURGICAL PCR SCREEN
MRSA, PCR: NEGATIVE
Staphylococcus aureus: NEGATIVE

## 2021-01-13 LAB — APTT: aPTT: 28 seconds (ref 24–36)

## 2021-01-13 LAB — ABO/RH: ABO/RH(D): A POS

## 2021-01-13 LAB — MAGNESIUM: Magnesium: 1.9 mg/dL (ref 1.7–2.4)

## 2021-01-13 SURGERY — FIXATION, FRACTURE, INTERTROCHANTERIC, WITH INTRAMEDULLARY ROD
Anesthesia: General | Site: Hip | Laterality: Right

## 2021-01-13 MED ORDER — CHLORHEXIDINE GLUCONATE 0.12 % MT SOLN: 15.0000 mL | Freq: Once | OROMUCOSAL | Status: DC

## 2021-01-13 MED ORDER — HYDROCODONE-ACETAMINOPHEN 5-325 MG PO TABS
1.0000 | ORAL_TABLET | ORAL | Status: DC | PRN
  Administered 2021-01-13 – 2021-01-15 (×2): 2 via ORAL
  Filled 2021-01-13 (×2): qty 2

## 2021-01-13 MED ORDER — METOPROLOL TARTRATE 5 MG/5ML IV SOLN: 5.0000 mg | INTRAVENOUS | Status: DC | PRN

## 2021-01-13 MED ORDER — FAMOTIDINE IN NACL 20-0.9 MG/50ML-% IV SOLN
20.0000 mg | Freq: Once | INTRAVENOUS | Status: AC
  Administered 2021-01-13: 20 mg via INTRAVENOUS
  Filled 2021-01-13: qty 50

## 2021-01-13 MED ORDER — SODIUM CHLORIDE 0.9 % IR SOLN
Status: DC | PRN
  Administered 2021-01-13: 1000 mL

## 2021-01-13 MED ORDER — SODIUM CHLORIDE 0.9 % IV SOLN: INTRAVENOUS | Status: DC

## 2021-01-13 MED ORDER — CEFAZOLIN SODIUM-DEXTROSE 2-4 GM/100ML-% IV SOLN
2.0000 g | INTRAVENOUS | Status: AC
  Administered 2021-01-13: 2 g via INTRAVENOUS
  Filled 2021-01-13: qty 100

## 2021-01-13 MED ORDER — DOCUSATE SODIUM 100 MG PO CAPS
100.0000 mg | ORAL_CAPSULE | Freq: Two times a day (BID) | ORAL | Status: DC
  Administered 2021-01-13 – 2021-01-17 (×8): 100 mg via ORAL
  Filled 2021-01-13 (×8): qty 1

## 2021-01-13 MED ORDER — PROPOFOL 10 MG/ML IV BOLUS
INTRAVENOUS | Status: DC | PRN
  Administered 2021-01-13 (×2): 20 mg via INTRAVENOUS

## 2021-01-13 MED ORDER — FENTANYL CITRATE (PF) 100 MCG/2ML IJ SOLN
INTRAMUSCULAR | Status: DC | PRN
  Administered 2021-01-13: 25 ug via INTRAVENOUS

## 2021-01-13 MED ORDER — POVIDONE-IODINE 10 % EX SWAB: 2.0000 "application " | Freq: Once | CUTANEOUS | Status: DC

## 2021-01-13 MED ORDER — CEFAZOLIN SODIUM-DEXTROSE 2-4 GM/100ML-% IV SOLN
2.0000 g | Freq: Four times a day (QID) | INTRAVENOUS | Status: AC
Start: 2021-01-13 — End: 2021-01-14
  Administered 2021-01-13 – 2021-01-14 (×2): 2 g via INTRAVENOUS
  Filled 2021-01-13 (×2): qty 100

## 2021-01-13 MED ORDER — LIDOCAINE 2% (20 MG/ML) 5 ML SYRINGE
INTRAMUSCULAR | Status: DC | PRN
  Administered 2021-01-13 (×2): 40 mg via INTRAVENOUS

## 2021-01-13 MED ORDER — FENTANYL CITRATE (PF) 100 MCG/2ML IJ SOLN
INTRAMUSCULAR | Status: DC | PRN
  Administered 2021-01-13: 15 ug via INTRATHECAL

## 2021-01-13 MED ORDER — DEXAMETHASONE SODIUM PHOSPHATE 4 MG/ML IJ SOLN
INTRAMUSCULAR | Status: DC | PRN
  Administered 2021-01-13: 4 mg via INTRAVENOUS

## 2021-01-13 MED ORDER — MORPHINE SULFATE (PF) 2 MG/ML IV SOLN: 0.5000 mg | INTRAVENOUS | Status: DC | PRN

## 2021-01-13 MED ORDER — FENTANYL CITRATE (PF) 100 MCG/2ML IJ SOLN
INTRAMUSCULAR | Status: AC
  Filled 2021-01-13: qty 2

## 2021-01-13 MED ORDER — SENNOSIDES-DOCUSATE SODIUM 8.6-50 MG PO TABS: 1.0000 | ORAL_TABLET | Freq: Every evening | ORAL | Status: DC | PRN

## 2021-01-13 MED ORDER — METOCLOPRAMIDE HCL 10 MG PO TABS
5.0000 mg | ORAL_TABLET | Freq: Three times a day (TID) | ORAL | Status: DC | PRN
Start: 2021-01-13 — End: 2021-01-17

## 2021-01-13 MED ORDER — CHLORHEXIDINE GLUCONATE 4 % EX LIQD: 60.0000 mL | Freq: Once | CUTANEOUS | Status: DC

## 2021-01-13 MED ORDER — BUPIVACAINE HCL (PF) 0.5 % IJ SOLN: INTRAMUSCULAR | Status: DC | PRN

## 2021-01-13 MED ORDER — BUPIVACAINE LIPOSOME 1.3 % IJ SUSP
INTRAMUSCULAR | Status: AC
  Filled 2021-01-13: qty 20

## 2021-01-13 MED ORDER — ONDANSETRON HCL 4 MG PO TABS: 4.0000 mg | ORAL_TABLET | Freq: Four times a day (QID) | ORAL | Status: DC | PRN

## 2021-01-13 MED ORDER — PHENOL 1.4 % MT LIQD: 1.0000 | OROMUCOSAL | Status: DC | PRN

## 2021-01-13 MED ORDER — ASPIRIN EC 325 MG PO TBEC
325.0000 mg | DELAYED_RELEASE_TABLET | Freq: Every day | ORAL | Status: DC
  Administered 2021-01-14 – 2021-01-17 (×4): 325 mg via ORAL
  Filled 2021-01-13 (×4): qty 1

## 2021-01-13 MED ORDER — TRANEXAMIC ACID-NACL 1000-0.7 MG/100ML-% IV SOLN
1000.0000 mg | Freq: Once | INTRAVENOUS | Status: AC
  Administered 2021-01-13: 1000 mg via INTRAVENOUS
  Filled 2021-01-13: qty 100

## 2021-01-13 MED ORDER — PROPOFOL 10 MG/ML IV BOLUS
INTRAVENOUS | Status: AC
  Filled 2021-01-13: qty 20

## 2021-01-13 MED ORDER — TRAMADOL HCL 50 MG PO TABS
50.0000 mg | ORAL_TABLET | Freq: Four times a day (QID) | ORAL | Status: DC
  Administered 2021-01-13 – 2021-01-17 (×14): 50 mg via ORAL
  Filled 2021-01-13 (×15): qty 1

## 2021-01-13 MED ORDER — METOCLOPRAMIDE HCL 5 MG/ML IJ SOLN: 5.0000 mg | Freq: Three times a day (TID) | INTRAMUSCULAR | Status: DC | PRN

## 2021-01-13 MED ORDER — LACTATED RINGERS IV SOLN: INTRAVENOUS | Status: DC

## 2021-01-13 MED ORDER — ONDANSETRON HCL 4 MG/2ML IJ SOLN: 4.0000 mg | Freq: Four times a day (QID) | INTRAMUSCULAR | Status: DC | PRN

## 2021-01-13 MED ORDER — MENTHOL 3 MG MT LOZG: 1.0000 | LOZENGE | OROMUCOSAL | Status: DC | PRN

## 2021-01-13 MED ORDER — VASOPRESSIN 20 UNIT/ML IV SOLN
INTRAVENOUS | Status: AC
  Filled 2021-01-13: qty 1

## 2021-01-13 MED ORDER — BUPIVACAINE HCL (PF) 0.5 % IJ SOLN
INTRAMUSCULAR | Status: AC
  Filled 2021-01-13: qty 30

## 2021-01-13 MED ORDER — MUPIROCIN 2 % EX OINT
1.0000 "application " | TOPICAL_OINTMENT | Freq: Two times a day (BID) | CUTANEOUS | Status: DC
  Administered 2021-01-13 – 2021-01-17 (×7): 1 via NASAL
  Filled 2021-01-13: qty 22

## 2021-01-13 MED ORDER — METOPROLOL TARTRATE 5 MG/5ML IV SOLN
INTRAVENOUS | Status: AC
  Administered 2021-01-13: 2.5 mg via INTRAVENOUS
  Filled 2021-01-13: qty 5

## 2021-01-13 MED ORDER — HYDROMORPHONE HCL 1 MG/ML IJ SOLN: 0.2500 mg | INTRAMUSCULAR | Status: DC | PRN

## 2021-01-13 MED ORDER — ORAL CARE MOUTH RINSE: 15.0000 mL | Freq: Once | OROMUCOSAL | Status: DC

## 2021-01-13 MED ORDER — METHOCARBAMOL 500 MG PO TABS: 500.0000 mg | ORAL_TABLET | Freq: Four times a day (QID) | ORAL | Status: DC | PRN

## 2021-01-13 MED ORDER — DEXAMETHASONE SODIUM PHOSPHATE 10 MG/ML IJ SOLN
INTRAMUSCULAR | Status: AC
  Filled 2021-01-13: qty 1

## 2021-01-13 MED ORDER — BUPIVACAINE LIPOSOME 1.3 % IJ SUSP
INTRAMUSCULAR | Status: DC | PRN
  Administered 2021-01-13: 20 mL

## 2021-01-13 MED ORDER — ONDANSETRON HCL 4 MG/2ML IJ SOLN
INTRAMUSCULAR | Status: DC | PRN
  Administered 2021-01-13: 4 mg via INTRAVENOUS

## 2021-01-13 MED ORDER — CHLORHEXIDINE GLUCONATE CLOTH 2 % EX PADS
6.0000 | MEDICATED_PAD | Freq: Every day | CUTANEOUS | Status: DC
  Administered 2021-01-13 – 2021-01-14 (×2): 6 via TOPICAL

## 2021-01-13 MED ORDER — PROPOFOL 500 MG/50ML IV EMUL
INTRAVENOUS | Status: DC | PRN
  Administered 2021-01-13: 30 ug/kg/min via INTRAVENOUS

## 2021-01-13 MED ORDER — METHOCARBAMOL 1000 MG/10ML IJ SOLN
500.0000 mg | Freq: Four times a day (QID) | INTRAMUSCULAR | Status: DC | PRN
  Filled 2021-01-13: qty 5

## 2021-01-13 MED ORDER — LIDOCAINE HCL (PF) 2 % IJ SOLN
INTRAMUSCULAR | Status: AC
  Filled 2021-01-13: qty 5

## 2021-01-13 MED ORDER — BUPIVACAINE HCL (PF) 0.5 % IJ SOLN
INTRAMUSCULAR | Status: DC | PRN
  Administered 2021-01-13: 2 mL via INTRATHECAL

## 2021-01-13 MED ORDER — ONDANSETRON HCL 4 MG/2ML IJ SOLN
INTRAMUSCULAR | Status: AC
  Filled 2021-01-13: qty 2

## 2021-01-13 MED ORDER — ACETAMINOPHEN 500 MG PO TABS
500.0000 mg | ORAL_TABLET | Freq: Four times a day (QID) | ORAL | Status: AC
  Administered 2021-01-13 – 2021-01-14 (×4): 500 mg via ORAL
  Filled 2021-01-13 (×4): qty 1

## 2021-01-13 MED ORDER — METOPROLOL TARTRATE 5 MG/5ML IV SOLN: 2.5000 mg | Freq: Once | INTRAVENOUS | Status: AC

## 2021-01-13 MED ORDER — PHENYLEPHRINE HCL-NACL 10-0.9 MG/250ML-% IV SOLN
INTRAVENOUS | Status: AC
  Filled 2021-01-13: qty 250

## 2021-01-13 MED ORDER — HYDROMORPHONE HCL 1 MG/ML IJ SOLN
0.5000 mg | Freq: Once | INTRAMUSCULAR | Status: AC
  Administered 2021-01-13: 0.5 mg via INTRAVENOUS

## 2021-01-13 MED ORDER — ONDANSETRON HCL 4 MG/2ML IJ SOLN: 4.0000 mg | Freq: Once | INTRAMUSCULAR | Status: DC | PRN

## 2021-01-13 SURGICAL SUPPLY — 61 items
APL PRP STRL LF DISP 70% ISPRP (MISCELLANEOUS) ×1
APL SKNCLS STERI-STRIP NONHPOA (GAUZE/BANDAGES/DRESSINGS) ×1
BENZOIN TINCTURE PRP APPL 2/3 (GAUZE/BANDAGES/DRESSINGS) ×2 IMPLANT
BIT DRILL AO GAMMA 4.2X300 (BIT) ×3 IMPLANT
BLADE SURG SZ10 CARB STEEL (BLADE) ×6 IMPLANT
BNDG GAUZE ELAST 4 BULKY (GAUZE/BANDAGES/DRESSINGS) ×3 IMPLANT
CHLORAPREP W/TINT 26 (MISCELLANEOUS) ×3 IMPLANT
CLOSURE WOUND 1/2 X4 (GAUZE/BANDAGES/DRESSINGS) ×1
CLOTH BEACON ORANGE TIMEOUT ST (SAFETY) ×3 IMPLANT
COVER LIGHT HANDLE STERIS (MISCELLANEOUS) ×6 IMPLANT
COVER MAYO STAND XLG (MISCELLANEOUS) ×3 IMPLANT
COVER PERINEAL POST (MISCELLANEOUS) ×3 IMPLANT
DECANTER SPIKE VIAL GLASS SM (MISCELLANEOUS) ×6 IMPLANT
DRAPE STERI IOBAN 125X83 (DRAPES) ×3 IMPLANT
DRSG MEPILEX BORDER 4X12 (GAUZE/BANDAGES/DRESSINGS) ×3 IMPLANT
DRSG MEPILEX SACRM 8.7X9.8 (GAUZE/BANDAGES/DRESSINGS) ×3 IMPLANT
DRSG PAD ABDOMINAL 8X10 ST (GAUZE/BANDAGES/DRESSINGS) ×3 IMPLANT
ELECT REM PT RETURN 9FT ADLT (ELECTROSURGICAL) ×3
ELECTRODE REM PT RTRN 9FT ADLT (ELECTROSURGICAL) ×1 IMPLANT
GLOVE SKINSENSE NS SZ8.0 LF (GLOVE) ×2
GLOVE SKINSENSE STRL SZ8.0 LF (GLOVE) ×1 IMPLANT
GLOVE SS N UNI LF 8.5 STRL (GLOVE) ×3 IMPLANT
GLOVE SURG UNDER POLY LF SZ7 (GLOVE) ×10 IMPLANT
GOWN STRL REUS W/TWL LRG LVL3 (GOWN DISPOSABLE) ×7 IMPLANT
GOWN STRL REUS W/TWL XL LVL3 (GOWN DISPOSABLE) ×3 IMPLANT
GUIDEROD T2 3X1000 (ROD) ×3 IMPLANT
INST SET MAJOR BONE (KITS) ×3 IMPLANT
K-WIRE  3.2X450M STR (WIRE) ×3
K-WIRE 3.2X450M STR (WIRE) ×1
KIT BLADEGUARD II DBL (SET/KITS/TRAYS/PACK) ×3 IMPLANT
KIT TURNOVER CYSTO (KITS) ×3 IMPLANT
KWIRE 3.2X450M STR (WIRE) ×1 IMPLANT
MANIFOLD NEPTUNE II (INSTRUMENTS) ×3 IMPLANT
MARKER SKIN DUAL TIP RULER LAB (MISCELLANEOUS) ×3 IMPLANT
NAIL TROCH GAMMA 11X18 (Nail) ×2 IMPLANT
NDL HYPO 18GX1.5 BLUNT FILL (NEEDLE) IMPLANT
NDL HYPO 21X1.5 SAFETY (NEEDLE) ×1 IMPLANT
NDL SPNL 18GX3.5 QUINCKE PK (NEEDLE) ×1 IMPLANT
NEEDLE HYPO 18GX1.5 BLUNT FILL (NEEDLE) ×3 IMPLANT
NEEDLE HYPO 21X1.5 SAFETY (NEEDLE) ×3 IMPLANT
NEEDLE SPNL 18GX3.5 QUINCKE PK (NEEDLE) ×3 IMPLANT
NS IRRIG 1000ML POUR BTL (IV SOLUTION) ×3 IMPLANT
PACK BASIC III (CUSTOM PROCEDURE TRAY) ×3
PACK SRG BSC III STRL LF ECLPS (CUSTOM PROCEDURE TRAY) ×1 IMPLANT
PAD ARMBOARD 7.5X6 YLW CONV (MISCELLANEOUS) ×5 IMPLANT
PENCIL SMOKE EVACUATOR COATED (MISCELLANEOUS) ×3 IMPLANT
SCREW LAG GAMMA 3 TI 10.5X105M (Screw) ×2 IMPLANT
SCREW LOCKING T2 F/T  5X42.5MM (Screw) ×3 IMPLANT
SCREW LOCKING T2 F/T 5X42.5MM (Screw) IMPLANT
SET BASIN LINEN APH (SET/KITS/TRAYS/PACK) ×3 IMPLANT
SPONGE T-LAP 18X18 ~~LOC~~+RFID (SPONGE) ×6 IMPLANT
STRIP CLOSURE SKIN 1/2X4 (GAUZE/BANDAGES/DRESSINGS) ×1 IMPLANT
SUT MNCRL 0 VIOLET CTX 36 (SUTURE) ×1 IMPLANT
SUT MON AB 2-0 CT1 36 (SUTURE) ×5 IMPLANT
SUT MONOCRYL 0 CTX 36 (SUTURE) ×6
SYR 20ML LL LF (SYRINGE) ×2 IMPLANT
SYR 30ML LL (SYRINGE) ×3 IMPLANT
SYR BULB IRRIG 60ML STRL (SYRINGE) ×6 IMPLANT
TRAY FOLEY W/BAG SLVR 16FR (SET/KITS/TRAYS/PACK) ×3
TRAY FOLEY W/BAG SLVR 16FR ST (SET/KITS/TRAYS/PACK) IMPLANT
YANKAUER SUCT BULB TIP NO VENT (SUCTIONS) ×3 IMPLANT

## 2021-01-13 NOTE — Brief Op Note (Signed)
01/12/2021 - 01/13/2021  3:39 PM  PATIENT:  Jorge Lester  87 y.o. male  PRE-OPERATIVE DIAGNOSIS:  Intertrochanteric fracture right hip  POST-OPERATIVE DIAGNOSIS:  Intertrochanteric fracture right hip  PROCEDURE:  Procedure(s) with comments: INTRAMEDULLARY (IM) NAIL INTERTROCHANTRIC (Right) - Gamma nail short 125  SURGEON:  Surgeon(s) and Role:    * Vickki Hearing, MD - Primary  PHYSICIAN ASSISTANT:   ASSISTANTS: none   ANESTHESIA:   spinal  EBL:  35 mL   BLOOD ADMINISTERED:none  DRAINS: none   LOCAL MEDICATIONS USED:  OTHER exparel 20 cc full-strength  SPECIMEN:  No Specimen  DISPOSITION OF SPECIMEN:  N/A  COUNTS:  YES  TOURNIQUET:  * No tourniquets in log *  DICTATION: .Dragon Dictation  PLAN OF CARE: Admit to inpatient   PATIENT DISPOSITION:  PACU - hemodynamically stable.   Delay start of Pharmacological VTE agent (>24hrs) due to surgical blood loss or risk of bleeding: yes

## 2021-01-13 NOTE — Interval H&P Note (Signed)
History and Physical Interval Note:  01/13/2021 1:03 PM  Jorge Lester  has presented today for surgery, with the diagnosis of Intertrochanteric fracture right hip.  The various methods of treatment have been discussed with the patient and family. After consideration of risks, benefits and other options for treatment, the patient has consented to  Procedure(s) with comments: INTRAMEDULLARY (IM) NAIL INTERTROCHANTRIC (Right) - Gamma nail short 125 as a surgical intervention.  The patient's history has been reviewed, patient examined, no change in status, stable for surgery.  I have reviewed the patient's chart and labs.  Questions were answered to the patient's satisfaction.     Fuller Canada

## 2021-01-13 NOTE — Progress Notes (Signed)
Pt returned via bed from PACU s/p right hip fixation. Pt awake, drowsy, oriented x4. Denies c/o pain, has (+) sensation, strong pulse, and brisk cap refill in both legs and feet but unable to move toes/feet or legs at this time. Pt asked to sit up, advised pt that until he had movement in his legs he needed to lie flat to let spinal anesthesia wear off and prevent headache. Pt and family state understanding.  Foley cath draining clear yellow urine. IV site WNL. Mepilex dressing D&I to right lateral hip surgical site. Pt given ice chips at present, tolerating well, dentures placed back in mouth per pt request.

## 2021-01-13 NOTE — Op Note (Signed)
Open treatment internal fixation of the hip with CEPHALOMEDULLARY nail  Implant gamma nail 125 degrees short 105 lag screw 40 2.5 locking screw  Size as stated  01/13/2021 3:41 PM   Preop diagnosis right hip fracture intertrochanteric  Postoperative diagnosis same Procedure open treatment internal fixation of the right hip Surgeon Romeo Apple Assistants none Anesthesia spinal Surgical findings comminuted slightly displaced right INTERTROCHANTERIC HIP FRACTURE   BLOOD ADMINISTERED:none  DRAINS: none   LOCAL MEDICATIONS USED: Exparel 20 cc full-strength   SPECIMEN:  No Specimen  DISPOSITION OF SPECIMEN:  N/A  COUNTS:  CORRECT   DICTATION: .Dragon Dictation   Surgeon Romeo Apple  The patient was taken to the recovery room in stable condition  PLAN OF CARE: Discharge to home after PACU  PATIENT DISPOSITION:  PACU - hemodynamically stable.   Delay start of Pharmacological VTE agent (>24hrs) due to surgical blood loss or risk of bleeding: Yes    The surgery was done as follows: The patient was identified in the preop area the surgical site (right hip) was confirmed and marked after thorough chart review including radiographs; implants were checked personally.  The patient was brought back to the operating room for anesthesia and then was placed on the fracture table. The operative leg was placed in traction the nonoperative leg was padded and placed in a boot and abducted  The C-arm was brought in and a closed reduction was performed with the C-arm.  Multiplane x-rays were taken and the leg was manipulated until a stable reduction was obtained using traction to obtain proper neck shaft angle and then rotation to correct rotational malalignment  The right leg was prepped and draped with ChloraPrep. This was followed by timeout which confirmed as surgical site, implants, x-ray gowns and badges.  The incision was made over the right hip at the greater trochanter proximally,  subcutaneous tissue was divided down to the fascial layer which was split in line with the skin and incision.  This was followed by blunt dissection with a finger down to the tip of the trochanter  The sharp tipped awl was passed down to the level lesser TROCHANTER  followed by insertion of a guidewire down to the knee.  X-ray confirmed position of the guidewire and proximal reaming was performed down to the level of the lesser trochanter.  We then passed a  125 degree nail  A second incision was made distal to the initial incision and the subcutaneous tissue was divided, muscle and fascia were split down to bone   After correction for rotation (a perforating drill was used to breech the cortex of the lateral femur followed by) insertion of a threaded guidewire which was placed in the center of the femoral head on AP and lateral x-ray  We measured the pin distance at 105 and then set the reamer to the same number  (105)  followed by reaming over the guidewire.  The lag screw was then placed over the guidewire and the guidewire was removed.  (After irrigation of the proximal portion of the nail the setscrew was locked and then backed off one quarter turn.  This was followed by confirming engagement of the setscrew by toggling the screwdriver connected to the lag screw.)  We next use the external guide to place a third incision and then passed a cannula with a sharp tip followed by drilling with a 4.5 drill bit and a distal locking screw which measured 42.5  Final images confirmed reduction of the fracture and  hardware position.  The wound was irrigated with copious amounts of saline and closed in layered fashion starting with 0 Monocryl, 2-0 Monocryl and we used no staples just subcuticular stitches   A sterile bandage was applied  Postoperative plan Weightbearing as tolerated Staples if present can be removed postop day 12-14 Anticoagulation for 28 days Follow-up visit at 4 weeks for x-rays and  then x-rays at 6 weeks and 12 weeks [id number]  SURGEON:  Surgeon(s) and Role:    Vickki Hearing, MD - Primary

## 2021-01-13 NOTE — Progress Notes (Signed)
Pt remains drowsy but awakens easily to name called, oriented x4. Only c/o pain is in stomach area, states, "I'm hungry". IV famotadine administered per order. Pt was able to eat about 25% of supper meal, fed by daughter, no difficulty swallowing liquids or food. Denies any c/o pain at surgical site, DDI. Pt still with good sensation in both legs, cap refill brisk (<3sec) and skin warm/dry. Pt able to wiggle toes on both feet just a little, but no other movement at this time. Pt still not allowed to sit up in bed at this time, head only slightly elevated for food/liquids. IVF continue without s/s infiltration.

## 2021-01-13 NOTE — Anesthesia Preprocedure Evaluation (Addendum)
Anesthesia Evaluation  Patient identified by MRN, date of birth, ID band Patient awake    Reviewed: Allergy & Precautions, NPO status , Patient's Chart, lab work & pertinent test results, reviewed documented beta blocker date and time   Airway Mallampati: II  TM Distance: >3 FB Neck ROM: Full    Dental  (+) Edentulous Upper, Edentulous Lower   Pulmonary sleep apnea , COPD, former smoker,    Pulmonary exam normal breath sounds clear to auscultation       Cardiovascular Exercise Tolerance: Poor hypertension, Pt. on medications and Pt. on home beta blockers + CAD, + Past MI and + Cardiac Stents   Rhythm:Irregular Rate:Tachycardia - Systolic murmurs, - Diastolic murmurs and - Friction Rub 13-Jan-2021 08:08:01 [facility]-ED ROUTINE RECORD Oct 11, 1933 (46 yr) Male Caucasian Room:ER3 Loc:906 Technician: [id number] Test ind: Vent. rate 99 BPM PR interval * ms QRS duration 104 ms QT/QTcB 357/459 ms P-R-T axes * 74 49 Atrial fibrillation Anteroseptal infarct, age indeterminate   Neuro/Psych  Neuromuscular disease (Parkinsonism) negative psych ROS   GI/Hepatic GERD  Medicated and Controlled,  Endo/Other    Renal/GU Renal disease (AKI)     Musculoskeletal   Abdominal   Peds  Hematology  (+) Blood dyscrasia, anemia ,   Anesthesia Other Findings 1. The left ventricle has normal systolic function, with an ejection  fraction of 55-60%. The cavity size was normal. There is mildly increased  left ventricular wall thickness. Left ventricular diastolic Doppler  parameters are consistent with impaired  relaxation. No evidence of left ventricular regional wall motion  abnormalities.  2. The right ventricle has normal systolic function. The cavity was  normal. There is no increase in right ventricular wall thickness. Right  ventricular systolic pressure could not be assessed.  3. The aortic valve is tricuspid.  Mild calcification of the aortic valve.  Aortic valve regurgitation is mild by color flow Doppler. Mild aortic  annular calcification noted.  4. The mitral valve is grossly normal. There is mild mitral annular  calcification present.  5. The tricuspid valve is grossly normal.  6. The aortic root is normal in size and structure  Reproductive/Obstetrics                            Anesthesia Physical Anesthesia Plan  ASA: 3  Anesthesia Plan: General/Spinal   Post-op Pain Management:    Induction:   PONV Risk Score and Plan: Ondansetron  Airway Management Planned: Natural Airway and Nasal Cannula  Additional Equipment:   Intra-op Plan:   Post-operative Plan:   Informed Consent: I have reviewed the patients History and Physical, chart, labs and discussed the procedure including the risks, benefits and alternatives for the proposed anesthesia with the patient or authorized representative who has indicated his/her understanding and acceptance.       Plan Discussed with: CRNA and Surgeon  Anesthesia Plan Comments:         Anesthesia Quick Evaluation

## 2021-01-13 NOTE — Progress Notes (Signed)
Pt taken to OR via bed by OR staff

## 2021-01-13 NOTE — Transfer of Care (Signed)
Immediate Anesthesia Transfer of Care Note  Patient: Jorge Lester  Procedure(s) Performed: INTRAMEDULLARY (IM) NAIL INTERTROCHANTRIC (Right: Hip)  Patient Location: PACU  Anesthesia Type:MAC and Spinal  Level of Consciousness: awake, alert , oriented and patient cooperative  Airway & Oxygen Therapy: Patient Spontanous Breathing  Post-op Assessment: Report given to RN, Post -op Vital signs reviewed and stable and Patient moving all extremities  Post vital signs: Reviewed and stable  Last Vitals:  Vitals Value Taken Time  BP    Temp    Pulse    Resp    SpO2      Last Pain:  Vitals:   01/13/21 1257  TempSrc:   PainSc: Asleep         Complications: No notable events documented.

## 2021-01-13 NOTE — Consult Note (Signed)
HOSPITAL CONSULT  [facility]   Patient ID: Jorge Lester, male   DOB: 1933/11/04, 87 y.o.   MRN: [id number]  New patient  Requested by: Dr Shon Hale  Reason for: fractured right hip  Based on the information below I recommend ORIF RIGHT HIP   Chief Complaint  Patient presents with   Fall     HPI  48 h/o CAD, COPD and Parkinson's disease lives alone tripped while trying to maneuver a ladder fell injuring right hip. Presented to the ER with severe pain, external rotation deformity, and inability to walk. DOI : 01/12/2021  Review of Systems (all) Review of Systems  Unable to perform ROS: Acuity of condition   Past Medical History:  Diagnosis Date   Anemia    CAD (coronary artery disease)    a. CTO of LAD known since 2008, s/p DES x 2 to CTO 01/01/13. b. known residual nonobstructive disease in LCx, RCA.   COPD (chronic obstructive pulmonary disease) (HCC)    GERD (gastroesophageal reflux disease)    Gout    Hyperlipidemia    Hypertension    Mitral regurgitation    Prev mild, most recently trivial by echo 11/2012.   Orthostatic hypotension 06/04/2017   Parkinson's disease    Sleep apnea     Past Surgical History:  Procedure Laterality Date   CARDIAC CATHETERIZATION  2008   CORONARY ANGIOPLASTY WITH STENT PLACEMENT  01/01/2013   "2" (01/01/2013)   LUMBAR DISC SURGERY  1974   PERCUTANEOUS CORONARY STENT INTERVENTION (PCI-S) N/A 01/01/2013   Procedure: PERCUTANEOUS CORONARY STENT INTERVENTION (PCI-S);  Surgeon: Peter M Swaziland, MD;  Location: [facility] CATH LAB;  Service: Cardiovascular;  Laterality: N/A;   TONSILLECTOMY AND ADENOIDECTOMY  1947   TRIGGER FINGER RELEASE  1980's   VASECTOMY      Family History  Problem Relation Age of Onset   Ovarian cancer Mother    Coronary artery disease Father    Alzheimer's disease Father    Diabetes Brother    Diabetes Other    Transient ischemic attack Other    Social History   Tobacco Use   Smoking status: Former     Packs/day: 2.00    Years: 44.00    Pack years: 88.00    Types: Cigarettes    Start date: 07/16/1945    Quit date: 07/16/1989    Years since quitting: 31.5   Smokeless tobacco: Never  Vaping Use   Vaping Use: Never used  Substance Use Topics   Alcohol use: No    Alcohol/week: 0.0 standard drinks    Comment: 01/01/2013 "last alcohol was 1983"   Drug use: No   Allergies  Allergen Reactions   Amantadines     Hallucinations   Sulfonamide Derivatives     Current Facility-Administered Medications:    albuterol (PROVENTIL) (2.5 MG/3ML) 0.083% nebulizer solution 2.5 mg, 2.5 mg, Nebulization, Q4H PRN, Adefeso, Oladapo, DO   carbidopa-levodopa (SINEMET CR) 50-200 MG per tablet controlled release 1 tablet, 1 tablet, Oral, QHS, Adefeso, Oladapo, DO, 1 tablet at 01/12/21 2133   doxazosin (CARDURA) tablet 2 mg, 2 mg, Oral, QHS, Adefeso, Oladapo, DO, 2 mg at 01/12/21 2134   famotidine (PEPCID) tablet 20 mg, 20 mg, Oral, Daily, Adefeso, Oladapo, DO, 20 mg at 01/12/21 2132   HYDROmorphone (DILAUDID) injection 0.5 mg, 0.5 mg, Intravenous, Q3H PRN, Adefeso, Oladapo, DO, 0.5 mg at 01/13/21 0518   QUEtiapine (SEROQUEL) tablet 25 mg, 25 mg, Oral, QHS, Adefeso, Oladapo, DO, 25 mg at 01/12/21  2132   simvastatin (ZOCOR) tablet 40 mg, 40 mg, Oral, Daily, Adefeso, Oladapo, DO, 40 mg at 01/12/21 2132  Current Outpatient Medications:    acetaminophen (TYLENOL) 500 MG tablet, Take 1,000 mg by mouth every 6 (six) hours as needed., Disp: , Rfl:    albuterol (VENTOLIN HFA) 108 (90 Base) MCG/ACT inhaler, Inhale 2 puffs into the lungs every 4 (four) hours as needed for wheezing or shortness of breath., Disp: 8 g, Rfl: 5   aspirin 81 MG tablet, Take 81 mg by mouth daily. Reported on 01/24/2016, Disp: , Rfl:    carbidopa-levodopa (SINEMET CR) 50-200 MG tablet, 1/2 tablet in the morning and midday, 1 tablet at 6 PM and Midnight, Disp: 270 tablet, Rfl: 3   carbidopa-levodopa (SINEMET IR) 25-100 MG tablet, TAKE 1 TABLET  BY MOUTH EVERY MORNING, AT NOON AND TAKE 1/2 TABLET BY MOUTH EVERY EVENING, Disp: 75 tablet, Rfl: 5   doxazosin (CARDURA) 2 MG tablet, Take 2 mg by mouth at bedtime.  , Disp: , Rfl:    famotidine (PEPCID) 20 MG tablet, TAKE (1) TABLET TWICE DAILY. (Patient taking differently: Take 20 mg by mouth daily.), Disp: 60 tablet, Rfl: 3   Glycopyrrolate-Formoterol (BEVESPI AEROSPHERE) 9-4.8 MCG/ACT AERO, Inhale 2 puffs into the lungs in the morning and at bedtime., Disp: 10.7 g, Rfl: 6   mirtazapine (REMERON) 15 MG tablet, TAKE 1 TABLET BY MOUTH AT BEDTIME, Disp: 90 tablet, Rfl: 1   nitroGLYCERIN (NITROSTAT) 0.4 MG SL tablet, Place 1 tablet (0.4 mg total) under the tongue every 5 (five) minutes as needed., Disp: 25 tablet, Rfl: 3   QUEtiapine (SEROQUEL) 25 MG tablet, TAKE 1 TABLET BY MOUTH AT BEDTIME, Disp: 90 tablet, Rfl: 3   ropinirole (REQUIP) 5 MG tablet, TAKE 1 TABLET BY MOUTH BEFORE bedtime (Patient taking differently: Take 5 mg by mouth See admin instructions. TAKE 1 TABLET BY MOUTH BEFORE BEDTIME), Disp: 90 tablet, Rfl: 2   simvastatin (ZOCOR) 40 MG tablet, Take 1 tablet by mouth daily. Changed by Dr. Margo Common, Disp: , Rfl:    acetaminophen (TYLENOL) 650 MG CR tablet, Take 1,300 mg by mouth every morning. (Patient not taking: Reported on 01/12/2021), Disp: , Rfl:    albuterol (PROVENTIL) (2.5 MG/3ML) 0.083% nebulizer solution, Take 3 mLs (2.5 mg total) by nebulization every 4 (four) hours as needed for wheezing or shortness of breath. (Patient not taking: Reported on 01/12/2021), Disp: 75 mL, Rfl: 11   doxazosin (CARDURA) 2 MG tablet, Take 1 tablet by mouth daily. (Patient not taking: Reported on 01/12/2021), Disp: , Rfl:    simvastatin (ZOCOR) 40 MG tablet, Take 1 tablet by mouth every evening. (Patient not taking: Reported on 01/12/2021), Disp: , Rfl:    Tiotropium Bromide-Olodaterol (STIOLTO RESPIMAT) 2.5-2.5 MCG/ACT AERS, Inhale into the lungs. (Patient not taking: Reported on 01/12/2021), Disp: , Rfl:      Physical Exam(=30) BP 126/65   Pulse 99   Temp 99.2 F (37.3 C) (Oral)   Resp 17   Ht 5\' 10"  (1.778 m)   Wt 67.6 kg   SpO2 95%   BMI 21.38 kg/m   Gen. Appearance thin small frame ectomorphic Peripheral vascular system no abnormalities detected Lymph nodes ARE NORMAL  Gait unable to ambulate  Left Upper extremity  Inspection revealed no malalignment or asymmetry  Assessment of range of motion: Full range of motion was recorded  Assessment of stability: Elbow wrist and hand and shoulder were stable  Assessment of muscle strength and tone revealed grade 5  muscle strength and normal muscle tone  Skin was normal without rash lesion or ulceration  Right upper extremity  Inspection revealed no malalignment or asymmetry  Assessment of range of motion: Full range of motion was recorded  Assessment of stability: Elbow wrist and hand and shoulder were stable  Assessment of muscle strength and tone revealed grade 5 muscle strength and normal muscle tone  Skin was normal without rash lesion or ulceration  Right Lower extremity Inspection revealed tenderness proximal femur and hip with external rotation malalignment  Range of motion deferred right lower extremity  Stability of the hip deferred knee reduced ankle reduced stable  Muscle tone normal with tremor  Skin normal   Left lower extremity Inspection revealed no malalignment or asymmetry Assessment of range of motion: Full range of motion was recorded Assessment of stability: Ankle, knee and hip were stable Assessment of muscle strength and tone revealed grade 5 muscle strength and normal muscle tone Skin was normal without rash lesion or ulceration  Coordination was tested by finger-to-nose nose and was normal Deferred Deep tendon reflexes were 2+ in the upper extremities and 2+ in the lower extremities Examination of sensation by touch was normal  Mental status  Oriented to time person and not place  Mood and  affect normal without depression anxiety or agitation  Dx:   Data Reviewed  ER RECORD REVIEWED: CONFIRMS HISTORY   I reviewed the following images and the reports and my independent interpretation is right intertrochanteric fracture mildly comminuted and minimally displaced   Assessment  Intetrochanteric fracture right hip   Plan   ORIF RIGHT HIP WITH GAMMA NAIL    Vickki Hearing MD

## 2021-01-13 NOTE — Progress Notes (Signed)
OT Cancellation Note  Patient Details Name: Jorge Lester MRN: [id number] DOB: Jul 20, 1933   Cancelled Treatment:    Reason Eval/Treat Not Completed: Medical issues which prohibited therapy. Patient to go to surgery, will wait for OT orders from Ortho Md.  Tylyn Derwin OT, MOT  Danie Chandler 01/13/2021, 9:38 AM

## 2021-01-13 NOTE — Progress Notes (Signed)
Pt arrived to room via stretcher from ED at 0830. Pt moved from stretcher to bed with 4 assist. Pt c/o pain right hip/leg, right hip externally rotated and leg shortened. Pillow placed under right lateral leg for support and pain relief per pt request. Good sensation in right leg, cap refill brisk right foot/toes, + movement of right foot/toes/leg.  Pt noted to have small tick attached to left hip/flank area, tick removed. No redness noted. MD Courage notified of tick bite. IV site WNL, no s/s infiltration.  Pt and daughter oriented to room and safety procedures. CHL bath completed as pt is scheduled for surgery at 1300 today. Both Dr. Marisa Severin and Dr. Romeo Apple in to see and evaluate pt.

## 2021-01-13 NOTE — Progress Notes (Signed)
Patient Demographics:    Jorge Lester, is a 87 y.o. male, DOB - Oct 11, 1933, [id number]  Admit date - 01/12/2021   Admitting Physician Frankey Shown, DO  Outpatient Primary MD for the patient is Suzan Slick, MD  LOS - 1   Chief Complaint  Patient presents with   Fall        Subjective:    Jorge Lester today has no fevers, no emesis,  No chest pain, resting comfortably postop  Assessment  & Plan :    Principal Problem:   Right femoral fracture (HCC) Active Problems:   PARKINSON'S DISEASE   Essential hypertension   Coronary artery disease involving native coronary artery of native heart without angina pectoris   Hyperlipidemia   Insomnia, unspecified   Paroxysmal atrial fibrillation with RVR (HCC)   Normocytic anemia   AKI (acute kidney injury) (HCC)   Dehydration   Accidental fall   COPD (chronic obstructive pulmonary disease) (HCC)   BPH (benign prostatic hyperplasia)   Brief Summary:- 87 y.o. male with medical history significant for CAD s/p stent placement, COPD, GERD, Parkinson disease, hyperlipidemia admitted on 01/12/21 with Rt hip fracture after mechanical fall at home  A/p 1)Rt Hip Fx- comminuted slightly displaced right INTERTROCHANTERIC HIP FRACTURE--- s/p ORIF (IM Nail) on 01/13/21 Ortho Postoperative plan Weightbearing as tolerated Staples if present can be removed postop day 12-14 Anticoagulation for 28 days Follow-up visit at 4 weeks for x-rays and then x-rays at 6 weeks and 12 weeks  Paroxysmal atrial fibrillation with RVR CHA2DS2- VASc score   is = 4.8    Which is  equal to =  4.8% annual risk of stroke  Continue telemetry Rate is currently controlled, continue to monitor and manage accordingly No anticoagulant at this time due to impending surgical procedure in the morning  2)Acute kidney injury with no known history of CKD -Creatinine is down to 1.40  from 1.74 with hydration Continue gentle hydration Renally adjust medications, avoid nephrotoxic agents/dehydration/hypotension  3) acute on chronic anemia --hemoglobin down to 11.7 from a baseline usually above 12 in the setting of acute right hip fracture anticipate further drop in H&H postop -Monitor closely and transfuse as clinically indicated  4)CAD s/p stent placement Continue Zocor -PTA patient was on aspirin 81 mg daily patient will need aspirin 325 mg daily for post hip surgery DVT prophylaxis  5)Parkinson disease Continue Sinemet   6)BPH Continue doxazosin  7)COPD Continue albuterol as needed  8)Insomnia Continue Seroquel     DVT prophylaxis: SCDs/ASA 325 mg daily   Code Status :  -  Code Status: Full Code   Family Communication:    (patient is alert, awake and coherent)   Consults  :  ortho  DVT Prophylaxis  :   - SCDs  SCDs Start: 01/13/21 1636 SCDs Start: 01/12/21 2049  Lab Results  Component Value Date   PLT 133 (L) 01/13/2021    Inpatient Medications  Scheduled Meds:  acetaminophen  500 mg Oral Q6H   [START ON 01/14/2021] aspirin EC  325 mg Oral Q breakfast   carbidopa-levodopa  1 tablet Oral QHS   Chlorhexidine Gluconate Cloth  6 each Topical Daily   docusate sodium  100 mg Oral BID   doxazosin  2 mg Oral QHS   famotidine  20 mg Oral Daily   mupirocin ointment  1 application Nasal BID   QUEtiapine  25 mg Oral QHS   simvastatin  40 mg Oral Daily   traMADol  50 mg Oral Q6H   Continuous Infusions:  sodium chloride 100 mL/hr at 01/13/21 1655    ceFAZolin (ANCEF) IV     famotidine (PEPCID) IV     methocarbamol (ROBAXIN) IV     tranexamic acid 1,000 mg (01/13/21 1657)   PRN Meds:.albuterol, HYDROcodone-acetaminophen, HYDROmorphone (DILAUDID) injection, menthol-cetylpyridinium **OR** phenol, methocarbamol **OR** methocarbamol (ROBAXIN) IV, metoCLOPramide **OR** metoCLOPramide (REGLAN) injection, metoprolol tartrate, morphine injection,  ondansetron **OR** ondansetron (ZOFRAN) IV, senna-docusate   Anti-infectives (From admission, onward)    Start     Dose/Rate Route Frequency Ordered Stop   01/13/21 2000  ceFAZolin (ANCEF) IVPB 2g/100 mL premix        2 g 200 mL/hr over 30 Minutes Intravenous Every 6 hours 01/13/21 1635 01/14/21 0759   01/13/21 1145  ceFAZolin (ANCEF) IVPB 2g/100 mL premix        2 g 200 mL/hr over 30 Minutes Intravenous On call to O.R. 01/13/21 1052 01/13/21 1416         Objective:   Vitals:   01/13/21 1545 01/13/21 1600 01/13/21 1615 01/13/21 1633  BP: 103/71 124/70 115/77 127/77  Pulse: 82 97 100 (!) 104  Resp: 12 (!) 22 17   Temp: 97.9 F (36.6 C)     TempSrc:      SpO2: 100% 95% 93% 92%  Weight:      Height:        Wt Readings from Last 3 Encounters:  01/12/21 67.6 kg  01/04/21 66.7 kg  10/12/20 68.5 kg     Intake/Output Summary (Last 24 hours) at 01/13/2021 1703 Last data filed at 01/13/2021 1545 Gross per 24 hour  Intake 2000 ml  Output 1435 ml  Net 565 ml   Physical Exam  Gen:- Awake Alert,  in no apparent distress  HEENT:- [place].AT, No sclera icterus Neck-Supple Neck,No JVD,.  Lungs-  CTAB , fair symmetrical air movement CV- S1, S2 normal, irregular  Abd-  +ve B.Sounds, Abd Soft, No tenderness,    Extremity/Skin:- No  edema, pedal pulses present  Psych-affect is appropriate, oriented x3 Neuro-no new focal deficits, no tremors MSK-right hip postop wound clean dry and intact   Data Review:   Micro Results Recent Results (from the past 240 hour(s))  Resp Panel by RT-PCR (Flu A&B, Covid) Nasopharyngeal Swab     Status: None   Collection Time: 01/12/21  7:30 PM   Specimen: Nasopharyngeal Swab; Nasopharyngeal(NP) swabs in vial transport medium  Result Value Ref Range Status   SARS Coronavirus 2 by RT PCR NEGATIVE NEGATIVE Final    Comment: (NOTE) SARS-CoV-2 target nucleic acids are NOT DETECTED.  The SARS-CoV-2 RNA is generally detectable in upper  respiratory specimens during the acute phase of infection. The lowest concentration of SARS-CoV-2 viral copies this assay can detect is 138 copies/mL. A negative result does not preclude SARS-Cov-2 infection and should not be used as the sole basis for treatment or other patient management decisions. A negative result may occur with  improper specimen collection/handling, submission of specimen other than nasopharyngeal swab, presence of viral mutation(s) within the areas targeted by this assay, and inadequate number of viral copies(<138 copies/mL). A negative result must be combined with clinical observations, patient history, and epidemiological information. The expected result is Negative.  Fact Sheet for Patients:  BloggerCourse.com  Fact Sheet for Healthcare Providers:  SeriousBroker.it  This test is no t yet approved or cleared by the [place] FDA and  has been authorized for detection and/or diagnosis of SARS-CoV-2 by FDA under an Emergency Use Authorization (EUA). This EUA will remain  in effect (meaning this test can be used) for the duration of the COVID-19 declaration under Section 564(b)(1) of the Act, 21 U.S.C.section 360bbb-3(b)(1), unless the authorization is terminated  or revoked sooner.       Influenza A by PCR NEGATIVE NEGATIVE Final   Influenza B by PCR NEGATIVE NEGATIVE Final    Comment: (NOTE) The Xpert Xpress SARS-CoV-2/FLU/RSV plus assay is intended as an aid in the diagnosis of influenza from Nasopharyngeal swab specimens and should not be used as a sole basis for treatment. Nasal washings and aspirates are unacceptable for Xpert Xpress SARS-CoV-2/FLU/RSV testing.  Fact Sheet for Patients: BloggerCourse.com  Fact Sheet for Healthcare Providers: SeriousBroker.it  This test is not yet approved or cleared by the [place] FDA and has been  authorized for detection and/or diagnosis of SARS-CoV-2 by FDA under an Emergency Use Authorization (EUA). This EUA will remain in effect (meaning this test can be used) for the duration of the COVID-19 declaration under Section 564(b)(1) of the Act, 21 U.S.C. section 360bbb-3(b)(1), unless the authorization is terminated or revoked.  Performed at [facility], [address], [place], [place] [address]   Surgical PCR screen     Status: None   Collection Time: 01/13/21  9:30 AM   Specimen: Nasal Mucosa; Nasal Swab  Result Value Ref Range Status   MRSA, PCR NEGATIVE NEGATIVE Final   Staphylococcus aureus NEGATIVE NEGATIVE Final    Comment: (NOTE) The Xpert SA Assay (FDA approved for NASAL specimens in patients 20 years of age and older), is one component of a comprehensive surveillance program. It is not intended to diagnose infection nor to guide or monitor treatment. Performed at [facility], [address], [place], [place] [address]     Radiology Reports DG CHEST PORT 1 VIEW  Result Date: 01/13/2021 CLINICAL DATA:  Preoperative evaluation for upcoming hip surgery EXAM: PORTABLE CHEST 1 VIEW COMPARISON:  01/08/2019 FINDINGS: Heart is mildly enlarged in size. Aortic calcifications are again seen and stable. The lungs are hyperinflated without focal infiltrate or sizable effusion. Mild apical scarring is noted. No bony abnormality is seen. IMPRESSION: COPD without acute abnormality. Electronically Signed   By: Alcide Clever M.D.   On: 01/13/2021 08:28   DG HIP OPERATIVE UNILAT W OR W/O PELVIS RIGHT  Result Date: 01/13/2021 CLINICAL DATA:  Recent fall with right intratrochanteric fracture EXAM: OPERATIVE RIGHT HIP WITH PELVIS COMPARISON:  None. FLUOROSCOPY TIME:  Radiation Exposure Index (as provided by the fluoroscopic device): Not available If the device does not provide the exposure index: Fluoroscopy Time:  1 minutes 48 seconds Number of Acquired Images:  18 FINDINGS: Initial images  again demonstrate the intratrochanteric fracture of the right femur. Fracture fragments were produced. Medullary rod was then placed with compression screw traversing the femoral neck. Fracture fragments are in near anatomic alignment. IMPRESSION: ORIF of proximal right femoral fracture. Electronically Signed   By: Alcide Clever M.D.   On: 01/13/2021 16:22   DG HIP UNILAT WITH PELVIS 2-3 VIEWS RIGHT  Result Date: 01/13/2021 CLINICAL DATA:  Postop EXAM: DG HIP (WITH OR WITHOUT PELVIS) 2-3V RIGHT COMPARISON:  01/13/2021, 01/12/2021 FINDINGS: Interval intramedullary rodding and distal screw fixation of  the right femur for comminuted intertrochanteric fracture. Pubic symphysis and rami are intact. Gas in the soft tissues consistent with recent surgery. IMPRESSION: Interval operative fixation of right intertrochanteric fracture. Electronically Signed   By: Jasmine PangKim  Fujinaga M.D.   On: 01/13/2021 16:35   DG HIP UNILAT WITH PELVIS 2-3 VIEWS RIGHT  Result Date: 01/13/2021 CLINICAL DATA:  Status post fall. EXAM: DG HIP (WITH OR WITHOUT PELVIS) 2-3V LEFT COMPARISON:  None. FINDINGS: Acute, comminuted fracture deformity is seen extending through the inter trochanteric region of the proximal right femur. There is no evidence of dislocation. Mild degenerative changes are seen in the form of joint space narrowing and acetabular sclerosis. Moderate to marked severity vascular calcification is seen. IMPRESSION: Acute inter trochanteric fracture of the proximal right femur. Electronically Signed   By: Aram Candelahaddeus  Houston M.D.   On: 01/12/2021 18:14     CBC Recent Labs  Lab 01/12/21 1752 01/13/21 0430  WBC 8.4 8.4  HGB 12.8* 11.7*  HCT 39.9 36.5*  PLT 178 133*  MCV 96.1 95.3  MCH 30.8 30.5  MCHC 32.1 32.1  RDW 13.1 13.1  LYMPHSABS 0.6*  --   MONOABS 0.7  --   EOSABS 0.0  --   BASOSABS 0.1  --     Chemistries  Recent Labs  Lab 01/12/21 1752 01/13/21 0354  NA 139 137  K 4.6 4.6  CL 105 106  CO2 26 26   GLUCOSE 120* 108*  BUN 42* 39*  CREATININE 1.74* 1.40*  CALCIUM 8.7* 8.3*  MG  --  1.9  AST  --  18  ALT  --  5  ALKPHOS  --  53  BILITOT  --  1.3*   ------------------------------------------------------------------------------------------------------------------ No results for input(s): CHOL, HDL, LDLCALC, TRIG, CHOLHDL, LDLDIRECT in the last 72 hours.  No results found for: HGBA1C ------------------------------------------------------------------------------------------------------------------ No results for input(s): TSH, T4TOTAL, T3FREE, THYROIDAB in the last 72 hours.  Invalid input(s): FREET3 ------------------------------------------------------------------------------------------------------------------ No results for input(s): VITAMINB12, FOLATE, FERRITIN, TIBC, IRON, RETICCTPCT in the last 72 hours.  Coagulation profile Recent Labs  Lab 01/13/21 0354  INR 1.2    No results for input(s): DDIMER in the last 72 hours.  Cardiac Enzymes No results for input(s): CKMB, TROPONINI, MYOGLOBIN in the last 168 hours.  Invalid input(s): CK ------------------------------------------------------------------------------------------------------------------ No results found for: BNP   Shon Haleourage Irianna Gilday M.D on 01/13/2021 at 5:03 PM  Go to [email or web address] - for contact info  [facility] - Office  [phone]

## 2021-01-13 NOTE — Anesthesia Procedure Notes (Addendum)
Spinal  Patient location during procedure: OR Start time: 01/13/2021 1:56 PM End time: 01/13/2021 2:06 PM Reason for block: surgical anesthesia Staffing Performed: anesthesiologist  Anesthesiologist: Molli Barrows, MD Preanesthetic Checklist Completed: patient identified, IV checked, site marked, risks and benefits discussed, surgical consent, monitors and equipment checked, pre-op evaluation and timeout performed Spinal Block Patient position: left lateral decubitus Prep: Betadine Patient monitoring: heart rate, cardiac monitor, continuous pulse ox and blood pressure Approach: midline Location: L3-4 Injection technique: single-shot Needle Needle type: Spinocan  Needle gauge: 24 G Needle length: 10 cm Needle insertion depth: 7 cm Assessment Events: CSF return

## 2021-01-13 NOTE — Progress Notes (Signed)
PT Cancellation Note  Patient Details Name: Jorge Lester MRN: [id number] DOB: 09-Sep-1933   Cancelled Treatment:    Reason Eval/Treat Not Completed: Medical issues which prohibited therapy.  Patient to go to surgery, will wait for PT orders from Ortho Md.   8:07 AM, 01/13/21 Ocie Bob, MPT Physical Therapist with [facility] 336 [phone] office [phone] mobile phone

## 2021-01-13 NOTE — TOC Initial Note (Addendum)
Transition of Care [facility]) - Initial/Assessment Note    Patient Details  Name: Jorge Lester MRN: [id number] Date of Birth: 1934-06-22  Transition of Care [facility]) CM/SW Contact:    Barry Brunner, LCSW Phone Number: 01/13/2021, 1:40 PM  Clinical Narrative:                 Patient is an 87 year old male admitted for Right femoral fracture. CSW observed patient's high readmission risk score and conducted readmission risk assessment. CSW also conducted initial assessment. Patient lives alone at home and was able to perform ADL's independently prior to fall. Patient's daughter reported that they are agreeable to SNF with [facility] and [facility] with [facility] being their top choice. CSW obtained PASSR and began FL2. Patient's daughter also reported that patient is unvaccinated for Covid 19. TOC to follow.   Expected Discharge Plan: Skilled Nursing Facility Barriers to Discharge: Continued Medical Work up   Patient Goals and CMS Choice Patient states their goals for this hospitalization and ongoing recovery are:: Right femoral fracture CMS Medicare.gov Compare Post Acute Care list provided to:: Patient Choice offered to / list presented to : Patient  Expected Discharge Plan and Services Expected Discharge Plan: Skilled Nursing Facility     Post Acute Care Choice: NA Living arrangements for the past 2 months: Skilled Nursing Facility                                      Prior Living Arrangements/Services Living arrangements for the past 2 months: Skilled Nursing Facility Lives with:: Self Patient language and need for interpreter reviewed:: Yes        Need for Family Participation in Patient Care: Yes (Comment) Care giver support system in place?: Yes (comment)   Criminal Activity/Legal Involvement Pertinent to Current Situation/Hospitalization: No - Comment as needed  Activities of Daily Living      Permission Sought/Granted      Share Information with NAME:  O'Dell,Deborah  Permission granted to share info w AGENCY: [facility]  Permission granted to share info w Relationship: (Daughter)  Permission granted to share info w Contact Information: [phone]  Emotional Assessment       Orientation: : Oriented to Self, Oriented to Situation, Oriented to Place, Oriented to  Time Alcohol / Substance Use: Not Applicable Psych Involvement: No (comment)  Admission diagnosis:  Preop examination [Z01.818] Right femoral fracture (HCC) [S72.91XA] Closed fracture of right hip, initial encounter (HCC) [S72.001A] Patient Active Problem List   Diagnosis Date Noted   Right femoral fracture (HCC) 01/12/2021   Paroxysmal atrial fibrillation with RVR (HCC) 01/12/2021   Normocytic anemia 01/12/2021   AKI (acute kidney injury) (HCC) 01/12/2021   Dehydration 01/12/2021   Accidental fall 01/12/2021   COPD (chronic obstructive pulmonary disease) (HCC) 01/12/2021   BPH (benign prostatic hyperplasia) 01/12/2021   Orthostatic hypotension 06/04/2017   Visual hallucinations 10/04/2014   Coronary artery disease involving native coronary artery of native heart without angina pectoris    Hyperlipidemia    Hypertension    Exertional dyspnea 11/13/2012   Abnormality of gait 07/22/2012   Insomnia, unspecified 07/22/2012   Mitral regurgitation 11/04/2010   GLUCOSE INTOLERANCE 02/28/2009   PARKINSON'S DISEASE 02/28/2009   RENAL DISEASE, CHRONIC, MILD 02/28/2009   DYSLIPIDEMIA 06/23/2008   Essential hypertension 06/23/2008   EDEMA 06/23/2008   PCP:  Suzan Slick, MD Pharmacy:   [facility] - [place],  [place] - [address] [place] [address] Phone: [phone] Fax: [phone]     Social Determinants of Health (SDOH) Interventions    Readmission Risk Interventions Readmission Risk Prevention Plan 01/13/2021  Transportation Screening Complete  PCP or Specialist Appt within 5-7 Days Complete  Home Care Screening Complete   Medication Review (RN CM) Complete  Some recent data might be hidden

## 2021-01-13 NOTE — Anesthesia Postprocedure Evaluation (Signed)
Anesthesia Post Note  Patient: Jorge Lester  Procedure(s) Performed: INTRAMEDULLARY (IM) NAIL INTERTROCHANTRIC (Right: Hip)  Patient location during evaluation: PACU Anesthesia Type: Combined General/Spinal Level of consciousness: awake and alert and oriented Pain management: pain level controlled Vital Signs Assessment: post-procedure vital signs reviewed and stable Respiratory status: spontaneous breathing and respiratory function stable Cardiovascular status: blood pressure returned to baseline and stable Postop Assessment: no apparent nausea or vomiting Anesthetic complications: no   No notable events documented.   Last Vitals:  Vitals:   01/13/21 1600 01/13/21 1615  BP: 124/70   Pulse: 97 100  Resp: (!) 22 17  Temp:    SpO2: 95% 93%    Last Pain:  Vitals:   01/13/21 1615  TempSrc:   PainSc: 0-No pain                 Petina Muraski C Izear Pine

## 2021-01-13 NOTE — H&P (View-Only) (Signed)
HOSPITAL CONSULT  [facility]   Patient ID: Jorge Lester, male   DOB: 1933/11/04, 87 y.o.   MRN: [id number]  New patient  Requested by: Dr Shon Hale  Reason for: fractured right hip  Based on the information below I recommend ORIF RIGHT HIP   Chief Complaint  Patient presents with   Fall     HPI  48 h/o CAD, COPD and Parkinson's disease lives alone tripped while trying to maneuver a ladder fell injuring right hip. Presented to the ER with severe pain, external rotation deformity, and inability to walk. DOI : 01/12/2021  Review of Systems (all) Review of Systems  Unable to perform ROS: Acuity of condition   Past Medical History:  Diagnosis Date   Anemia    CAD (coronary artery disease)    a. CTO of LAD known since 2008, s/p DES x 2 to CTO 01/01/13. b. known residual nonobstructive disease in LCx, RCA.   COPD (chronic obstructive pulmonary disease) (HCC)    GERD (gastroesophageal reflux disease)    Gout    Hyperlipidemia    Hypertension    Mitral regurgitation    Prev mild, most recently trivial by echo 11/2012.   Orthostatic hypotension 06/04/2017   Parkinson's disease    Sleep apnea     Past Surgical History:  Procedure Laterality Date   CARDIAC CATHETERIZATION  2008   CORONARY ANGIOPLASTY WITH STENT PLACEMENT  01/01/2013   "2" (01/01/2013)   LUMBAR DISC SURGERY  1974   PERCUTANEOUS CORONARY STENT INTERVENTION (PCI-S) N/A 01/01/2013   Procedure: PERCUTANEOUS CORONARY STENT INTERVENTION (PCI-S);  Surgeon: Peter M Swaziland, MD;  Location: [facility] CATH LAB;  Service: Cardiovascular;  Laterality: N/A;   TONSILLECTOMY AND ADENOIDECTOMY  1947   TRIGGER FINGER RELEASE  1980's   VASECTOMY      Family History  Problem Relation Age of Onset   Ovarian cancer Mother    Coronary artery disease Father    Alzheimer's disease Father    Diabetes Brother    Diabetes Other    Transient ischemic attack Other    Social History   Tobacco Use   Smoking status: Former     Packs/day: 2.00    Years: 44.00    Pack years: 88.00    Types: Cigarettes    Start date: 07/16/1945    Quit date: 07/16/1989    Years since quitting: 31.5   Smokeless tobacco: Never  Vaping Use   Vaping Use: Never used  Substance Use Topics   Alcohol use: No    Alcohol/week: 0.0 standard drinks    Comment: 01/01/2013 "last alcohol was 1983"   Drug use: No   Allergies  Allergen Reactions   Amantadines     Hallucinations   Sulfonamide Derivatives     Current Facility-Administered Medications:    albuterol (PROVENTIL) (2.5 MG/3ML) 0.083% nebulizer solution 2.5 mg, 2.5 mg, Nebulization, Q4H PRN, Adefeso, Oladapo, DO   carbidopa-levodopa (SINEMET CR) 50-200 MG per tablet controlled release 1 tablet, 1 tablet, Oral, QHS, Adefeso, Oladapo, DO, 1 tablet at 01/12/21 2133   doxazosin (CARDURA) tablet 2 mg, 2 mg, Oral, QHS, Adefeso, Oladapo, DO, 2 mg at 01/12/21 2134   famotidine (PEPCID) tablet 20 mg, 20 mg, Oral, Daily, Adefeso, Oladapo, DO, 20 mg at 01/12/21 2132   HYDROmorphone (DILAUDID) injection 0.5 mg, 0.5 mg, Intravenous, Q3H PRN, Adefeso, Oladapo, DO, 0.5 mg at 01/13/21 0518   QUEtiapine (SEROQUEL) tablet 25 mg, 25 mg, Oral, QHS, Adefeso, Oladapo, DO, 25 mg at 01/12/21  2132   simvastatin (ZOCOR) tablet 40 mg, 40 mg, Oral, Daily, Adefeso, Oladapo, DO, 40 mg at 01/12/21 2132  Current Outpatient Medications:    acetaminophen (TYLENOL) 500 MG tablet, Take 1,000 mg by mouth every 6 (six) hours as needed., Disp: , Rfl:    albuterol (VENTOLIN HFA) 108 (90 Base) MCG/ACT inhaler, Inhale 2 puffs into the lungs every 4 (four) hours as needed for wheezing or shortness of breath., Disp: 8 g, Rfl: 5   aspirin 81 MG tablet, Take 81 mg by mouth daily. Reported on 01/24/2016, Disp: , Rfl:    carbidopa-levodopa (SINEMET CR) 50-200 MG tablet, 1/2 tablet in the morning and midday, 1 tablet at 6 PM and Midnight, Disp: 270 tablet, Rfl: 3   carbidopa-levodopa (SINEMET IR) 25-100 MG tablet, TAKE 1 TABLET  BY MOUTH EVERY MORNING, AT NOON AND TAKE 1/2 TABLET BY MOUTH EVERY EVENING, Disp: 75 tablet, Rfl: 5   doxazosin (CARDURA) 2 MG tablet, Take 2 mg by mouth at bedtime.  , Disp: , Rfl:    famotidine (PEPCID) 20 MG tablet, TAKE (1) TABLET TWICE DAILY. (Patient taking differently: Take 20 mg by mouth daily.), Disp: 60 tablet, Rfl: 3   Glycopyrrolate-Formoterol (BEVESPI AEROSPHERE) 9-4.8 MCG/ACT AERO, Inhale 2 puffs into the lungs in the morning and at bedtime., Disp: 10.7 g, Rfl: 6   mirtazapine (REMERON) 15 MG tablet, TAKE 1 TABLET BY MOUTH AT BEDTIME, Disp: 90 tablet, Rfl: 1   nitroGLYCERIN (NITROSTAT) 0.4 MG SL tablet, Place 1 tablet (0.4 mg total) under the tongue every 5 (five) minutes as needed., Disp: 25 tablet, Rfl: 3   QUEtiapine (SEROQUEL) 25 MG tablet, TAKE 1 TABLET BY MOUTH AT BEDTIME, Disp: 90 tablet, Rfl: 3   ropinirole (REQUIP) 5 MG tablet, TAKE 1 TABLET BY MOUTH BEFORE bedtime (Patient taking differently: Take 5 mg by mouth See admin instructions. TAKE 1 TABLET BY MOUTH BEFORE BEDTIME), Disp: 90 tablet, Rfl: 2   simvastatin (ZOCOR) 40 MG tablet, Take 1 tablet by mouth daily. Changed by Dr. Margo Common, Disp: , Rfl:    acetaminophen (TYLENOL) 650 MG CR tablet, Take 1,300 mg by mouth every morning. (Patient not taking: Reported on 01/12/2021), Disp: , Rfl:    albuterol (PROVENTIL) (2.5 MG/3ML) 0.083% nebulizer solution, Take 3 mLs (2.5 mg total) by nebulization every 4 (four) hours as needed for wheezing or shortness of breath. (Patient not taking: Reported on 01/12/2021), Disp: 75 mL, Rfl: 11   doxazosin (CARDURA) 2 MG tablet, Take 1 tablet by mouth daily. (Patient not taking: Reported on 01/12/2021), Disp: , Rfl:    simvastatin (ZOCOR) 40 MG tablet, Take 1 tablet by mouth every evening. (Patient not taking: Reported on 01/12/2021), Disp: , Rfl:    Tiotropium Bromide-Olodaterol (STIOLTO RESPIMAT) 2.5-2.5 MCG/ACT AERS, Inhale into the lungs. (Patient not taking: Reported on 01/12/2021), Disp: , Rfl:      Physical Exam(=30) BP 126/65   Pulse 99   Temp 99.2 F (37.3 C) (Oral)   Resp 17   Ht 5\' 10"  (1.778 m)   Wt 67.6 kg   SpO2 95%   BMI 21.38 kg/m   Gen. Appearance thin small frame ectomorphic Peripheral vascular system no abnormalities detected Lymph nodes ARE NORMAL  Gait unable to ambulate  Left Upper extremity  Inspection revealed no malalignment or asymmetry  Assessment of range of motion: Full range of motion was recorded  Assessment of stability: Elbow wrist and hand and shoulder were stable  Assessment of muscle strength and tone revealed grade 5  muscle strength and normal muscle tone  Skin was normal without rash lesion or ulceration  Right upper extremity  Inspection revealed no malalignment or asymmetry  Assessment of range of motion: Full range of motion was recorded  Assessment of stability: Elbow wrist and hand and shoulder were stable  Assessment of muscle strength and tone revealed grade 5 muscle strength and normal muscle tone  Skin was normal without rash lesion or ulceration  Right Lower extremity Inspection revealed tenderness proximal femur and hip with external rotation malalignment  Range of motion deferred right lower extremity  Stability of the hip deferred knee reduced ankle reduced stable  Muscle tone normal with tremor  Skin normal   Left lower extremity Inspection revealed no malalignment or asymmetry Assessment of range of motion: Full range of motion was recorded Assessment of stability: Ankle, knee and hip were stable Assessment of muscle strength and tone revealed grade 5 muscle strength and normal muscle tone Skin was normal without rash lesion or ulceration  Coordination was tested by finger-to-nose nose and was normal Deferred Deep tendon reflexes were 2+ in the upper extremities and 2+ in the lower extremities Examination of sensation by touch was normal  Mental status  Oriented to time person and not place  Mood and  affect normal without depression anxiety or agitation  Dx:   Data Reviewed  ER RECORD REVIEWED: CONFIRMS HISTORY   I reviewed the following images and the reports and my independent interpretation is right intertrochanteric fracture mildly comminuted and minimally displaced   Assessment  Intetrochanteric fracture right hip   Plan   ORIF RIGHT HIP WITH GAMMA NAIL    Vickki Hearing MD

## 2021-01-14 DIAGNOSIS — I2510 Atherosclerotic heart disease of native coronary artery without angina pectoris: Secondary | ICD-10-CM

## 2021-01-14 LAB — BASIC METABOLIC PANEL
Anion gap: 11 (ref 5–15)
BUN: 30 mg/dL — ABNORMAL HIGH (ref 8–23)
CO2: 22 mmol/L (ref 22–32)
Calcium: 8.4 mg/dL — ABNORMAL LOW (ref 8.9–10.3)
Chloride: 103 mmol/L (ref 98–111)
Creatinine, Ser: 1.26 mg/dL — ABNORMAL HIGH (ref 0.61–1.24)
GFR, Estimated: 56 mL/min — ABNORMAL LOW (ref >60)
Glucose, Bld: 113 mg/dL — ABNORMAL HIGH (ref 70–99)
Potassium: 4.3 mmol/L (ref 3.5–5.1)
Sodium: 136 mmol/L (ref 135–145)

## 2021-01-14 LAB — CBC
HCT: 31.6 % — ABNORMAL LOW (ref 39.0–52.0)
Hemoglobin: 10.3 g/dL — ABNORMAL LOW (ref 13.0–17.0)
MCH: 31.4 pg (ref 26.0–34.0)
MCHC: 32.6 g/dL (ref 30.0–36.0)
MCV: 96.3 fL (ref 80.0–100.0)
Platelets: 130 10*3/uL — ABNORMAL LOW (ref 150–400)
RBC: 3.28 MIL/uL — ABNORMAL LOW (ref 4.22–5.81)
RDW: 13.1 % (ref 11.5–15.5)
WBC: 13.2 10*3/uL — ABNORMAL HIGH (ref 4.0–10.5)
nRBC: 0 % (ref 0.0–0.2)

## 2021-01-14 MED ORDER — LORAZEPAM 2 MG/ML IJ SOLN
0.5000 mg | Freq: Two times a day (BID) | INTRAMUSCULAR | Status: DC | PRN
  Administered 2021-01-15: 0.5 mg via INTRAVENOUS
  Filled 2021-01-14: qty 1

## 2021-01-14 NOTE — TOC Progression Note (Signed)
Transition of Care [facility]) - Progression Note    Patient Details  Name: Jorge Lester MRN: [id number] Date of Birth: November 10, 1933  [facility]) CM/SW Contact  Barry Brunner, LCSW Phone Number: 01/14/2021, 5:09 PM  Clinical Narrative:    CSW received PT eval for patient. CSW faxed out patient to SNF facilities and started [place]. TOC to follow.   Expected Discharge Plan: Skilled Nursing Facility Barriers to Discharge: Continued Medical Work up  Expected Discharge Plan and Services Expected Discharge Plan: Skilled Nursing Facility     Post Acute Care Choice: NA Living arrangements for the past 2 months: Skilled Nursing Facility                                       Social Determinants of Health (SDOH) Interventions    Readmission Risk Interventions Readmission Risk Prevention Plan 01/13/2021  Transportation Screening Complete  PCP or Specialist Appt within 5-7 Days Complete  Home Care Screening Complete  Medication Review (RN CM) Complete  Some recent data might be hidden

## 2021-01-14 NOTE — Plan of Care (Signed)
  Problem: Acute Rehab PT Goals(only PT should resolve) Goal: Pt Will Go Supine/Side To Sit Flowsheets (Taken 01/14/2021 1549) Pt will go Supine/Side to Sit: with minimal assist Goal: Patient Will Transfer Sit To/From Stand Flowsheets (Taken 01/14/2021 1549) Patient will transfer sit to/from stand: with minimal assist Goal: Pt Will Transfer Bed To Chair/Chair To Bed Flowsheets (Taken 01/14/2021 1549) Pt will Transfer Bed to Chair/Chair to Bed: with min assist Goal: Pt Will Ambulate Flowsheets (Taken 01/14/2021 1549) Pt will Ambulate:  50 feet  with min guard assist  with rolling walker   3:49 PM, 01/14/21 Georges Lynch PT DPT  Physical Therapist with [facility]  [phone]

## 2021-01-14 NOTE — Progress Notes (Signed)
Patient Demographics:    Jorge Lester, is a 87 y.o. male, DOB - May 02, 1934, ZOX:[id number]  Admit date - 01/12/2021   Admitting Physician Frankey Shownladapo Adefeso, DO  Outpatient Primary MD for the patient is Suzan Slickucker, Alethea Y, MD  LOS - 2  Chief Complaint  Patient presents with   Fall        Subjective:    Jorge ReasWilliam Taulbee today has no fevers, no emesis,  No chest pain, resting daughter at bedside -Had episodes of confusion overnight -  Assessment  & Plan :    Principal Problem:   Right femoral fracture (HCC) Active Problems:   PARKINSON'S DISEASE   Essential hypertension   Coronary artery disease involving native coronary artery of native heart without angina pectoris   Hyperlipidemia   Insomnia, unspecified   Paroxysmal atrial fibrillation with RVR (HCC)   Normocytic anemia   AKI (acute kidney injury) (HCC)   Dehydration   Accidental fall   COPD (chronic obstructive pulmonary disease) (HCC)   BPH (benign prostatic hyperplasia)   Brief Summary:- 87 y.o. male with medical history significant for CAD s/p stent placement, COPD, GERD, Parkinson disease, hyperlipidemia admitted on 01/12/21 with Rt hip fracture after mechanical fall at home, s/p ORIF on 01/13/21  A/p 1)Rt Hip Fx- comminuted slightly displaced right INTERTROCHANTERIC HIP FRACTURE--- s/p ORIF (IM Nail) on 01/13/21 Ortho Postoperative plan Weightbearing as tolerated Staples if present can be removed postop day 12-14 days Anticoagulation for 28 days Follow-up visit at 4 weeks for x-rays and then x-rays at 6 weeks and 12 weeks  Paroxysmal atrial fibrillation with RVR CHA2DS2- VASc score   is = 4.8    Which is  equal to =  4.8% annual risk of stroke  Continue telemetry Rate is currently controlled, continue to monitor and manage accordingly No anticoagulant at this time due to impending surgical procedure in the morning  2)Acute kidney  injury with no known history of CKD -Creatinine is down to 1.26 from 1.74 with hydration Continue gentle hydration Renally adjust medications, avoid nephrotoxic agents/dehydration/hypotension  3)Acute on chronic anemia --hemoglobin down to 10.3  from a baseline usually above 12 in the setting of acute blood loss secondary to acute right hip fracture and hemodilution ---Monitor closely and transfuse as clinically indicated  4)CAD s/p stent placement Continue Zocor -PTA patient was on aspirin 81 mg daily patient will need aspirin 325 mg daily for post hip surgery DVT prophylaxis  5)Parkinson disease--- Continue Sinemet   6)BPH Continue doxazosin  7)COPD Continue albuterol as needed  8)Insomnia Continue Seroquel  9)Leukocytosis--- WBC 13.2 suspect reactive in the postop patient  10)Hospital Delirium----suspect anesthesia and narcotic induced, may use as needed lorazepam continue to monitor    DVT prophylaxis: SCDs/ASA 325 mg daily  Code Status :  -  Code Status: Full Code   Family Communication:    (patient is alert, awake and coherent)   Consults  :  ortho  DVT Prophylaxis  :   - SCDs  SCDs Start: 01/13/21 1636 SCDs Start: 01/12/21 2049  Lab Results  Component Value Date   PLT 130 (L) 01/14/2021    Inpatient Medications  Scheduled Meds:  aspirin EC  325 mg Oral Q breakfast   carbidopa-levodopa  1 tablet Oral  QHS   Chlorhexidine Gluconate Cloth  6 each Topical Daily   docusate sodium  100 mg Oral BID   doxazosin  2 mg Oral QHS   famotidine  20 mg Oral Daily   mupirocin ointment  1 application Nasal BID   QUEtiapine  25 mg Oral QHS   simvastatin  40 mg Oral Daily   traMADol  50 mg Oral Q6H   Continuous Infusions:  sodium chloride 50 mL/hr at 01/14/21 1205   methocarbamol (ROBAXIN) IV     PRN Meds:.albuterol, HYDROcodone-acetaminophen, HYDROmorphone (DILAUDID) injection, LORazepam, menthol-cetylpyridinium **OR** phenol, methocarbamol **OR** methocarbamol  (ROBAXIN) IV, metoCLOPramide **OR** metoCLOPramide (REGLAN) injection, metoprolol tartrate, morphine injection, ondansetron **OR** ondansetron (ZOFRAN) IV, senna-docusate   Anti-infectives (From admission, onward)    Start     Dose/Rate Route Frequency Ordered Stop   01/13/21 2000  ceFAZolin (ANCEF) IVPB 2g/100 mL premix        2 g 200 mL/hr over 30 Minutes Intravenous Every 6 hours 01/13/21 1635 01/14/21 0244   01/13/21 1145  ceFAZolin (ANCEF) IVPB 2g/100 mL premix        2 g 200 mL/hr over 30 Minutes Intravenous On call to O.R. 01/13/21 1052 01/13/21 1416         Objective:   Vitals:   01/14/21 0310 01/14/21 0445 01/14/21 0837 01/14/21 1349  BP: 134/73 136/77 123/78 114/62  Pulse: 100 95 97 93  Resp: 19 18  15   Temp: 98.2 F (36.8 C) 97.7 F (36.5 C) 97.8 F (36.6 C) 98.2 F (36.8 C)  TempSrc:  Axillary Oral Oral  SpO2: 98% 97% 98% 100%  Weight:      Height:        Wt Readings from Last 3 Encounters:  01/12/21 67.6 kg  01/04/21 66.7 kg  10/12/20 68.5 kg     Intake/Output Summary (Last 24 hours) at 01/14/2021 1431 Last data filed at 01/14/2021 1300 Gross per 24 hour  Intake 2400.44 ml  Output 2635 ml  Net -234.56 ml   Physical Exam  Gen:- Awake Alert,  in no apparent distress  HEENT:- [place].AT, No sclera icterus Neck-Supple Neck,No JVD,.  Lungs-  CTAB , fair symmetrical air movement CV- S1, S2 normal, irregular  Abd-  +ve B.Sounds, Abd Soft, No tenderness,    Extremity/Skin:- No  edema, pedal pulses present  Psych-affect is appropriate, oriented x3 Neuro-no new focal deficits, no tremors MSK-right hip postop wound clean dry and intact   Data Review:   Micro Results Recent Results (from the past 240 hour(s))  Resp Panel by RT-PCR (Flu A&B, Covid) Nasopharyngeal Swab     Status: None   Collection Time: 01/12/21  7:30 PM   Specimen: Nasopharyngeal Swab; Nasopharyngeal(NP) swabs in vial transport medium  Result Value Ref Range Status   SARS Coronavirus 2 by  RT PCR NEGATIVE NEGATIVE Final    Comment: (NOTE) SARS-CoV-2 target nucleic acids are NOT DETECTED.  The SARS-CoV-2 RNA is generally detectable in upper respiratory specimens during the acute phase of infection. The lowest concentration of SARS-CoV-2 viral copies this assay can detect is 138 copies/mL. A negative result does not preclude SARS-Cov-2 infection and should not be used as the sole basis for treatment or other patient management decisions. A negative result may occur with  improper specimen collection/handling, submission of specimen other than nasopharyngeal swab, presence of viral mutation(s) within the areas targeted by this assay, and inadequate number of viral copies(<138 copies/mL). A negative result must be combined with clinical observations, patient history,  and epidemiological information. The expected result is Negative.  Fact Sheet for Patients:  BloggerCourse.com  Fact Sheet for Healthcare Providers:  SeriousBroker.it  This test is no t yet approved or cleared by the [place] FDA and  has been authorized for detection and/or diagnosis of SARS-CoV-2 by FDA under an Emergency Use Authorization (EUA). This EUA will remain  in effect (meaning this test can be used) for the duration of the COVID-19 declaration under Section 564(b)(1) of the Act, 21 U.S.C.section 360bbb-3(b)(1), unless the authorization is terminated  or revoked sooner.       Influenza A by PCR NEGATIVE NEGATIVE Final   Influenza B by PCR NEGATIVE NEGATIVE Final    Comment: (NOTE) The Xpert Xpress SARS-CoV-2/FLU/RSV plus assay is intended as an aid in the diagnosis of influenza from Nasopharyngeal swab specimens and should not be used as a sole basis for treatment. Nasal washings and aspirates are unacceptable for Xpert Xpress SARS-CoV-2/FLU/RSV testing.  Fact Sheet for Patients: BloggerCourse.com  Fact Sheet  for Healthcare Providers: SeriousBroker.it  This test is not yet approved or cleared by the [place] FDA and has been authorized for detection and/or diagnosis of SARS-CoV-2 by FDA under an Emergency Use Authorization (EUA). This EUA will remain in effect (meaning this test can be used) for the duration of the COVID-19 declaration under Section 564(b)(1) of the Act, 21 U.S.C. section 360bbb-3(b)(1), unless the authorization is terminated or revoked.  Performed at [facility], [address] [place], [place] [address]   Surgical PCR screen     Status: None   Collection Time: 01/13/21  9:30 AM   Specimen: Nasal Mucosa; Nasal Swab  Result Value Ref Range Status   MRSA, PCR NEGATIVE NEGATIVE Final   Staphylococcus aureus NEGATIVE NEGATIVE Final    Comment: (NOTE) The Xpert SA Assay (FDA approved for NASAL specimens in patients 63 years of age and older), is one component of a comprehensive surveillance program. It is not intended to diagnose infection nor to guide or monitor treatment. Performed at [facility], [address] [place], [place] [address]     Radiology Reports DG CHEST PORT 1 VIEW  Result Date: 01/13/2021 CLINICAL DATA:  Preoperative evaluation for upcoming hip surgery EXAM: PORTABLE CHEST 1 VIEW COMPARISON:  01/08/2019 FINDINGS: Heart is mildly enlarged in size. Aortic calcifications are again seen and stable. The lungs are hyperinflated without focal infiltrate or sizable effusion. Mild apical scarring is noted. No bony abnormality is seen. IMPRESSION: COPD without acute abnormality. Electronically Signed   By: Alcide Clever M.D.   On: 01/13/2021 08:28   DG HIP OPERATIVE UNILAT W OR W/O PELVIS RIGHT  Result Date: 01/13/2021 CLINICAL DATA:  Recent fall with right intratrochanteric fracture EXAM: OPERATIVE RIGHT HIP WITH PELVIS COMPARISON:  None. FLUOROSCOPY TIME:  Radiation Exposure Index (as provided by the fluoroscopic device): Not  available If the device does not provide the exposure index: Fluoroscopy Time:  1 minutes 48 seconds Number of Acquired Images:  18 FINDINGS: Initial images again demonstrate the intratrochanteric fracture of the right femur. Fracture fragments were produced. Medullary rod was then placed with compression screw traversing the femoral neck. Fracture fragments are in near anatomic alignment. IMPRESSION: ORIF of proximal right femoral fracture. Electronically Signed   By: Alcide Clever M.D.   On: 01/13/2021 16:22   DG HIP UNILAT WITH PELVIS 2-3 VIEWS RIGHT  Result Date: 01/13/2021 CLINICAL DATA:  Postop EXAM: DG HIP (WITH OR WITHOUT PELVIS) 2-3V RIGHT COMPARISON:  01/13/2021, 01/12/2021  FINDINGS: Interval intramedullary rodding and distal screw fixation of the right femur for comminuted intertrochanteric fracture. Pubic symphysis and rami are intact. Gas in the soft tissues consistent with recent surgery. IMPRESSION: Interval operative fixation of right intertrochanteric fracture. Electronically Signed   By: Jasmine Pang M.D.   On: 01/13/2021 16:35   DG HIP UNILAT WITH PELVIS 2-3 VIEWS RIGHT  Result Date: 01/13/2021 CLINICAL DATA:  Status post fall. EXAM: DG HIP (WITH OR WITHOUT PELVIS) 2-3V LEFT COMPARISON:  None. FINDINGS: Acute, comminuted fracture deformity is seen extending through the inter trochanteric region of the proximal right femur. There is no evidence of dislocation. Mild degenerative changes are seen in the form of joint space narrowing and acetabular sclerosis. Moderate to marked severity vascular calcification is seen. IMPRESSION: Acute inter trochanteric fracture of the proximal right femur. Electronically Signed   By: Aram Candela M.D.   On: 01/12/2021 18:14     CBC Recent Labs  Lab 01/12/21 1752 01/13/21 0430 01/14/21 0550  WBC 8.4 8.4 13.2*  HGB 12.8* 11.7* 10.3*  HCT 39.9 36.5* 31.6*  PLT 178 133* 130*  MCV 96.1 95.3 96.3  MCH 30.8 30.5 31.4  MCHC 32.1 32.1 32.6  RDW 13.1  13.1 13.1  LYMPHSABS 0.6*  --   --   MONOABS 0.7  --   --   EOSABS 0.0  --   --   BASOSABS 0.1  --   --     Chemistries  Recent Labs  Lab 01/12/21 1752 01/13/21 0354 01/14/21 0550  NA 139 137 136  K 4.6 4.6 4.3  CL 105 106 103  CO2 26 26 22   GLUCOSE 120* 108* 113*  BUN 42* 39* 30*  CREATININE 1.74* 1.40* 1.26*  CALCIUM 8.7* 8.3* 8.4*  MG  --  1.9  --   AST  --  18  --   ALT  --  5  --   ALKPHOS  --  53  --   BILITOT  --  1.3*  --    ------------------------------------------------------------------------------------------------------------------ No results for input(s): CHOL, HDL, LDLCALC, TRIG, CHOLHDL, LDLDIRECT in the last 72 hours.  No results found for: HGBA1C ------------------------------------------------------------------------------------------------------------------ No results for input(s): TSH, T4TOTAL, T3FREE, THYROIDAB in the last 72 hours.  Invalid input(s): FREET3 ------------------------------------------------------------------------------------------------------------------ No results for input(s): VITAMINB12, FOLATE, FERRITIN, TIBC, IRON, RETICCTPCT in the last 72 hours.  Coagulation profile Recent Labs  Lab 01/13/21 0354  INR 1.2   No results for input(s): DDIMER in the last 72 hours.  Cardiac Enzymes No results for input(s): CKMB, TROPONINI, MYOGLOBIN in the last 168 hours.  Invalid input(s): CK ------------------------------------------------------------------------------------------------------------------ No results found for: BNP   03/16/21 M.D on 01/14/2021 at 2:31 PM  Go to [email or web address] - for contact info  [facility] - Office  [phone]

## 2021-01-14 NOTE — Evaluation (Signed)
Physical Therapy Evaluation Patient Details Name: Jorge Lester MRN: [id number] DOB: 12-Jun-1934 Today's Date: 01/14/2021   History of Present Illness  Jorge Lester is a 87 y.o. male with medical history significant for CAD s/p stent placement, COPD, GERD, Parkinson disease, hyperlipidemia who presents to the emergency department after sustaining a fall at home few hours PTA.  Patient states that he folded a step down ladder and placed it against the wall at home when he accidentally stepped on it and lost his balance thereby sustaining a fall with subsequent right hip pain and difficulty in being able to sustain weight on the right leg.  EMS was activated, patient was taken to the ED for further evaluation and management.  Patient denies hitting his head and denies numbness or tingling of the extremities.  Clinical Impression  Patient presents supine in bed with SCDs and ice pack in place. Patient with overall good tolerance to evaluation, but unable to move or coordinate RLE without assist. Patient requires Mod A for bed mobility and sit to stand transfers. Patient able to stand using RW support but demos poor standing stability and fatigues quickly. Patient unable to ambulate due to fatigue. Returned to bed and educated on LE strengthening exercise including partial ROM heel slides and ankle pumps to improve strength and RLE tolerance. Reapplied SCDs and ice pack. Patient let in bed with HOB elevated, phone and call bell in reach. Patient will benefit from continued physical therapy in hospital and recommended venue below to increase strength, balance, endurance for safe ADLs and gait.      Follow Up Recommendations SNF    Equipment Recommendations  None recommended by PT    Recommendations for Other Services       Precautions / Restrictions Precautions Precautions: Fall Restrictions Weight Bearing Restrictions: Yes RLE Weight Bearing: Weight bearing as tolerated      Mobility   Bed Mobility Overal bed mobility: Needs Assistance Bed Mobility: Supine to Sit;Sit to Supine     Supine to sit: Mod assist Sit to supine: Mod assist   General bed mobility comments: slow labored movement, needs assist with RLE    Transfers Overall transfer level: Needs assistance Equipment used: Rolling walker (2 wheeled) Transfers: Sit to/from Stand Sit to Stand: Mod assist         General transfer comment: slow labored movement  Ambulation/Gait             General Gait Details: Unable to ambulate at present due to fatigue/ weakness  Stairs            Wheelchair Mobility    Modified Rankin (Stroke Patients Only)       Balance Overall balance assessment: Needs assistance Sitting-balance support: Bilateral upper extremity supported;Feet supported Sitting balance-Leahy Scale: Poor Sitting balance - Comments: Requires BUE assist to maintain seated position Postural control: Posterior lean Standing balance support: Bilateral upper extremity supported Standing balance-Leahy Scale: Poor Standing balance comment: Requires Min A and BUE assist using RW to stand. Stands flexed and leans toward RT                             Pertinent Vitals/Pain Pain Assessment: No/denies pain    Home Living Family/patient expects to be discharged to:: Private residence Living Arrangements: Alone Available Help at Discharge: Family Type of Home: House Home Access: Stairs to enter Entrance Stairs-Rails: None Entrance Stairs-Number of Steps: 2 Home Layout: One level Home Equipment:  Walker - 2 wheels      Prior Function Level of Independence: Independent         Comments: Per patient report     Hand Dominance        Extremity/Trunk Assessment   Upper Extremity Assessment Upper Extremity Assessment: Generalized weakness    Lower Extremity Assessment Lower Extremity Assessment: Generalized weakness       Communication   Communication: No  difficulties  Cognition Arousal/Alertness: Awake/alert Behavior During Therapy: WFL for tasks assessed/performed Overall Cognitive Status: Within Functional Limits for tasks assessed                                        General Comments      Exercises     Assessment/Plan    PT Assessment Patient needs continued PT services  PT Problem List Decreased strength;Decreased mobility;Decreased range of motion;Decreased activity tolerance;Decreased balance;Pain       PT Treatment Interventions DME instruction;Therapeutic exercise;Manual techniques;Gait training;Balance training;Stair training;Neuromuscular re-education;Modalities;Therapeutic activities;Patient/family education;Functional mobility training    PT Goals (Current goals can be found in the Care Plan section)  Acute Rehab PT Goals Patient Stated Goal: Return home PT Goal Formulation: With patient Time For Goal Achievement: 01/28/21 Potential to Achieve Goals: Good    Frequency Min 5X/week   Barriers to discharge        Co-evaluation               AM-PAC PT "6 Clicks" Mobility  Outcome Measure Help needed turning from your back to your side while in a flat bed without using bedrails?: A Little Help needed moving from lying on your back to sitting on the side of a flat bed without using bedrails?: A Lot Help needed moving to and from a bed to a chair (including a wheelchair)?: A Lot Help needed standing up from a chair using your arms (e.g., wheelchair or bedside chair)?: A Lot Help needed to walk in hospital room?: A Lot Help needed climbing 3-5 steps with a railing? : Total 6 Click Score: 12    End of Session Equipment Utilized During Treatment: Gait belt Activity Tolerance: Patient tolerated treatment well;Patient limited by fatigue Patient left: in bed;with SCD's reapplied;with call bell/phone within reach Nurse Communication: Mobility status PT Visit Diagnosis: Unsteadiness on feet  (R26.81);Difficulty in walking, not elsewhere classified (R26.2);Other abnormalities of gait and mobility (R26.89);Muscle weakness (generalized) (M62.81)    Time: 1415-1440 PT Time Calculation (min) (ACUTE ONLY): 25 min   Charges:   PT Evaluation $PT Eval Low Complexity: 1 Low PT Treatments $Therapeutic Activity: 8-22 mins       3:48 PM, 01/14/21 Georges Lynch PT DPT  Physical Therapist with [facility]  [phone]

## 2021-01-14 NOTE — [place] FL2 (Signed)
[place] MEDICAID FL2 LEVEL OF CARE SCREENING TOOL     IDENTIFICATION  Patient Name: Jorge Lester Birthdate: 1933-08-06 Sex: male Admission Date (Current Location): 01/12/2021  [facility] [place] and [place] Number:  [employer] and Address:  [facility],  [address] [place] [address]      Provider Number: [id number]  Attending Physician Name and Address:  Shon Hale, MD  Relative Name and Phone Number:  O'Dell,Deborah (Daughter)   [phone]    Current Level of Care: Hospital Recommended Level of Care: Skilled Nursing Facility Prior Approval Number:    Date Approved/Denied: 01/13/21 PASRR Number: [id number]  Discharge Plan: SNF    Current Diagnoses: Patient Active Problem List   Diagnosis Date Noted   Right femoral fracture (HCC) 01/12/2021   Paroxysmal atrial fibrillation with RVR (HCC) 01/12/2021   Normocytic anemia 01/12/2021   AKI (acute kidney injury) (HCC) 01/12/2021   Dehydration 01/12/2021   Accidental fall 01/12/2021   COPD (chronic obstructive pulmonary disease) (HCC) 01/12/2021   BPH (benign prostatic hyperplasia) 01/12/2021   Orthostatic hypotension 06/04/2017   Visual hallucinations 10/04/2014   Coronary artery disease involving native coronary artery of native heart without angina pectoris    Hyperlipidemia    Hypertension    Exertional dyspnea 11/13/2012   Abnormality of gait 07/22/2012   Insomnia, unspecified 07/22/2012   Mitral regurgitation 11/04/2010   GLUCOSE INTOLERANCE 02/28/2009   PARKINSON'S DISEASE 02/28/2009   RENAL DISEASE, CHRONIC, MILD 02/28/2009   DYSLIPIDEMIA 06/23/2008   Essential hypertension 06/23/2008   EDEMA 06/23/2008    Orientation RESPIRATION BLADDER Height & Weight     Self, Time, Situation, Place  Normal Continent Weight: 149 lb (67.6 kg) Height:  5\' 10"  (177.8 cm)  BEHAVIORAL SYMPTOMS/MOOD NEUROLOGICAL BOWEL NUTRITION STATUS      Continent Diet  AMBULATORY STATUS COMMUNICATION  OF NEEDS Skin   Extensive Assist Verbally Surgical wounds (Knee bilateral)                       Personal Care Assistance Level of Assistance  Bathing, Feeding, Dressing Bathing Assistance: Maximum assistance Feeding assistance: Independent Dressing Assistance: Maximum assistance     Functional Limitations Info  Sight, Hearing, Speech Sight Info: Adequate Hearing Info: Impaired Speech Info: Adequate    SPECIAL CARE FACTORS FREQUENCY  PT (By licensed PT) (Diet regular Room service appropriate? Yes; Fluid consistency: Thin)     PT Frequency: 5x              Contractures Contractures Info: Not present    Additional Factors Info  Code Status, Allergies Code Status Info: Full Allergies Info: Amantadines, Sulfonamide Derivatives           Current Medications (01/14/2021):  This is the current hospital active medication list Current Facility-Administered Medications  Medication Dose Route Frequency Provider Last Rate Last Admin   0.9 %  sodium chloride infusion   Intravenous Continuous Emokpae, Courage, MD 50 mL/hr at 01/14/21 1205 New Bag at 01/14/21 1205   albuterol (PROVENTIL) (2.5 MG/3ML) 0.083% nebulizer solution 2.5 mg  2.5 mg Nebulization Q4H PRN 03/17/21, MD       aspirin EC tablet 325 mg  325 mg Oral Q breakfast Vickki Hearing, MD   325 mg at 01/14/21 0842   carbidopa-levodopa (SINEMET CR) 50-200 MG per tablet controlled release 1 tablet  1 tablet Oral QHS 03/17/21, MD   1 tablet at 01/13/21 2301   Chlorhexidine Gluconate Cloth 2 %  PADS 6 each  6 each Topical Daily Mariea Clonts, Courage, MD   6 each at 01/14/21 0842   docusate sodium (COLACE) capsule 100 mg  100 mg Oral BID Vickki Hearing, MD   100 mg at 01/14/21 0841   doxazosin (CARDURA) tablet 2 mg  2 mg Oral QHS Vickki Hearing, MD   2 mg at 01/13/21 2154   famotidine (PEPCID) tablet 20 mg  20 mg Oral Daily Vickki Hearing, MD   20 mg at 01/14/21 0841    HYDROcodone-acetaminophen (NORCO/VICODIN) 5-325 MG per tablet 1-2 tablet  1-2 tablet Oral Q4H PRN Vickki Hearing, MD   2 tablet at 01/13/21 2008   HYDROmorphone (DILAUDID) injection 0.5 mg  0.5 mg Intravenous Q3H PRN Vickki Hearing, MD   0.5 mg at 01/13/21 0857   LORazepam (ATIVAN) injection 0.5 mg  0.5 mg Intravenous Q12H PRN Shon Hale, MD       menthol-cetylpyridinium (CEPACOL) lozenge 3 mg  1 lozenge Oral PRN Vickki Hearing, MD       Or   phenol (CHLORASEPTIC) mouth spray 1 spray  1 spray Mouth/Throat PRN Vickki Hearing, MD       methocarbamol (ROBAXIN) tablet 500 mg  500 mg Oral Q6H PRN Vickki Hearing, MD       Or   methocarbamol (ROBAXIN) 500 mg in dextrose 5 % 50 mL IVPB  500 mg Intravenous Q6H PRN Vickki Hearing, MD       metoCLOPramide (REGLAN) tablet 5-10 mg  5-10 mg Oral Q8H PRN Vickki Hearing, MD       Or   metoCLOPramide (REGLAN) injection 5-10 mg  5-10 mg Intravenous Q8H PRN Vickki Hearing, MD       metoprolol tartrate (LOPRESSOR) injection 5 mg  5 mg Intravenous Q2H PRN Vickki Hearing, MD       morphine 2 MG/ML injection 0.5-1 mg  0.5-1 mg Intravenous Q2H PRN Vickki Hearing, MD       mupirocin ointment (BACTROBAN) 2 % 1 application  1 application Nasal BID Vickki Hearing, MD   1 application at 01/14/21 0842   ondansetron (ZOFRAN) tablet 4 mg  4 mg Oral Q6H PRN Vickki Hearing, MD       Or   ondansetron [facility]) injection 4 mg  4 mg Intravenous Q6H PRN Vickki Hearing, MD       QUEtiapine (SEROQUEL) tablet 25 mg  25 mg Oral QHS Vickki Hearing, MD   25 mg at 01/13/21 2154   senna-docusate (Senokot-S) tablet 1 tablet  1 tablet Oral QHS PRN Vickki Hearing, MD       simvastatin (ZOCOR) tablet 40 mg  40 mg Oral Daily Vickki Hearing, MD   40 mg at 01/14/21 0841   traMADol (ULTRAM) tablet 50 mg  50 mg Oral Q6H Vickki Hearing, MD   50 mg at 01/14/21 1202     Discharge Medications: Please see  discharge summary for a list of discharge medications.  Relevant Imaging Results:  Relevant Lab Results:   Additional Information Pt SSN: [id number]  Barry Brunner, LCSW

## 2021-01-14 NOTE — Progress Notes (Signed)
Removed patient's foley at 0615.  Patient had been pulling on foley tubing during the night.  Patient had some blood in his urine at time of foley removal.

## 2021-01-15 MED ORDER — ALPRAZOLAM 0.5 MG PO TABS: 0.5000 mg | ORAL_TABLET | Freq: Once | ORAL | Status: DC

## 2021-01-15 MED ORDER — POLYETHYLENE GLYCOL 3350 17 G PO PACK
17.0000 g | PACK | Freq: Every day | ORAL | Status: DC
  Administered 2021-01-15 – 2021-01-17 (×2): 17 g via ORAL
  Filled 2021-01-15 (×3): qty 1

## 2021-01-15 MED ORDER — FENTANYL CITRATE (PF) 100 MCG/2ML IJ SOLN: 25.0000 ug | INTRAMUSCULAR | Status: DC | PRN

## 2021-01-15 MED ORDER — SENNOSIDES-DOCUSATE SODIUM 8.6-50 MG PO TABS
2.0000 | ORAL_TABLET | Freq: Every day | ORAL | Status: DC
  Administered 2021-01-15 – 2021-01-16 (×2): 2 via ORAL
  Filled 2021-01-15 (×2): qty 2

## 2021-01-15 MED ORDER — BISACODYL 10 MG RE SUPP
10.0000 mg | Freq: Once | RECTAL | Status: AC
  Administered 2021-01-15: 10 mg via RECTAL
  Filled 2021-01-15: qty 1

## 2021-01-15 NOTE — Progress Notes (Signed)
Pt called out to nurses station, This nurse checked on pt Pt states " he keeps falling asleep and he is concerned his heart is stopping". This nurse obtained vitals and called tele to check pts heart monitor. Tele informed that nothing abnormal was noted. MD notified.

## 2021-01-15 NOTE — Progress Notes (Signed)
Patient Demographics:    Jorge Lester, is a 87 y.o. male, DOB - 11/09/1933, DJM:[id number]  Admit date - 01/12/2021   Admitting Physician Frankey Shown, DO  Outpatient Primary MD for the patient is Suzan Slick, MD  LOS - 3  Chief Complaint  Patient presents with   Fall        Subjective:    [name] today has no fevers, no emesis,  No chest pain,  No further confusion, eating and drinking well, daughter Ms Jorge Lester is at bedside -Patient is medically stable awaiting insurance approval to go to [facility] for rehab -Concerns about constipation  -  Assessment  & Plan :    Principal Problem:   Right femoral fracture (HCC) Active Problems:   PARKINSON'S DISEASE   Essential hypertension   Coronary artery disease involving native coronary artery of native heart without angina pectoris   Hyperlipidemia   Insomnia, unspecified   Paroxysmal atrial fibrillation with RVR (HCC)   Normocytic anemia   AKI (acute kidney injury) (HCC)   Dehydration   Accidental fall   COPD (chronic obstructive pulmonary disease) (HCC)   BPH (benign prostatic hyperplasia)   Brief Summary:- 87 y.o. male with medical history significant for CAD s/p stent placement, COPD, GERD, Parkinson disease, hyperlipidemia admitted on 01/12/21 with Rt hip fracture after mechanical fall at home, s/p ORIF on 01/13/21 -Patient is medically stable awaiting insurance approval to go to [facility] for rehab  A/p 1)Rt Hip Fx- comminuted slightly displaced right INTERTROCHANTERIC HIP FRACTURE--- s/p ORIF (IM Nail) on 01/13/21 Ortho Postoperative plan Weightbearing as tolerated Staples if present can be removed postop day 12-14 days Anticoagulation with ASA 325 mg daily for 28 days, after that backed on aspirin 81 mg daily for CAD/stents Follow-up visit at 4 weeks for x-rays and then x-rays at 6 weeks and 12  weeks   2)Paroxysmal atrial fibrillation with RVR CHA2DS2- VASc score   is = 4.8    Which is  equal to =  4.8% annual risk of stroke  Continue telemetry Rate is currently controlled, continue to monitor and manage accordingly No anticoagulant at this time  -- Currently on aspirin for DVT prophylaxis as above #1,  -Risk versus benefit of anticoagulation discussed with patient and patient's daughter  Ms Jorge Lester --she declines for anticoagulation at this time would rather do aspirin monotherapy due to High fall risk in the setting of Parkinson's disease with orthostatic hypotension  2)Acute kidney injury with no known history of CKD -Creatinine is down to 1.26 from 1.74 with hydration Continue gentle hydration Renally adjust medications, avoid nephrotoxic agents/dehydration/hypotension  3)Acute on chronic anemia --hemoglobin down to 10.3  from a baseline usually above 12 in the setting of acute blood loss secondary to acute right hip fracture and hemodilution ---Monitor closely and transfuse as clinically indicated  4)CAD s/p stent placement (PCI to LAD CTO 12/2012 with DES x2) -- 12/2015 nuclear stress low risk, possible mild ischemia Continue Zocor -PTA patient was on aspirin 81 mg daily patient will need aspirin 325 mg daily for post hip surgery DVT prophylaxis -CAD    5)Parkinson disease--- with h/o orthostatic Hypotension Continue Sinemet   6)BPH Continue doxazosin  7)COPD Continue albuterol as  needed  8)Insomnia Continue Seroquel  9)Leukocytosis--- WBC 13.2 suspect reactive in the postop patient  10)Hospital Delirium----suspect anesthesia and narcotic induced, may use as needed lorazepam continue to monitor --Mostly Resolved---   11) constipation--- laxatives as ordered    DVT prophylaxis: SCDs/ASA 325 mg daily  Code Status :  -  Code Status: Full Code   Family Communication:    (patient is alert, awake and coherent)  Discussed with daughter-daughter  Ms  Jorge Lester  at bedside Consults  :  ortho  DVT Prophylaxis  :   - SCDs  SCDs Start: 01/13/21 1636 SCDs Start: 01/12/21 2049  Lab Results  Component Value Date   PLT 130 (L) 01/14/2021    Inpatient Medications  Scheduled Meds:  aspirin EC  325 mg Oral Q breakfast   carbidopa-levodopa  1 tablet Oral QHS   docusate sodium  100 mg Oral BID   doxazosin  2 mg Oral QHS   famotidine  20 mg Oral Daily   mupirocin ointment  1 application Nasal BID   polyethylene glycol  17 g Oral Daily   QUEtiapine  25 mg Oral QHS   senna-docusate  2 tablet Oral QHS   simvastatin  40 mg Oral Daily   traMADol  50 mg Oral Q6H   Continuous Infusions:  sodium chloride 50 mL/hr at 01/15/21 1503   methocarbamol (ROBAXIN) IV     PRN Meds:.albuterol, HYDROcodone-acetaminophen, HYDROmorphone (DILAUDID) injection, LORazepam, menthol-cetylpyridinium **OR** phenol, methocarbamol **OR** methocarbamol (ROBAXIN) IV, metoCLOPramide **OR** metoCLOPramide (REGLAN) injection, metoprolol tartrate, morphine injection, ondansetron **OR** ondansetron (ZOFRAN) IV   Anti-infectives (From admission, onward)    Start     Dose/Rate Route Frequency Ordered Stop   01/13/21 2000  ceFAZolin (ANCEF) IVPB 2g/100 mL premix        2 g 200 mL/hr over 30 Minutes Intravenous Every 6 hours 01/13/21 1635 01/14/21 0244   01/13/21 1145  ceFAZolin (ANCEF) IVPB 2g/100 mL premix        2 g 200 mL/hr over 30 Minutes Intravenous On call to O.R. 01/13/21 1052 01/13/21 1416         Objective:   Vitals:   01/14/21 1956 01/15/21 0510 01/15/21 0834 01/15/21 1456  BP: 136/65 140/87 128/81 119/75  Pulse: 89 91 79 99  Resp: Temp: 98.6 F (37 C) 98.4 F (36.9 C) 97.7 F (36.5 C) 97.6 F (36.4 C)  TempSrc: Oral Oral Oral Oral  SpO2: 96% 95% 100% 100%  Weight:      Height:        Wt Readings from Last 3 Encounters:  01/12/21 67.6 kg  01/04/21 66.7 kg  10/12/20 68.5 kg     Intake/Output Summary (Last 24 hours) at  01/15/2021 1505 Last data filed at 01/15/2021 0945 Gross per 24 hour  Intake 720 ml  Output 1100 ml  Net -380 ml   Physical Exam  Gen:- Awake Alert,  in no apparent distress  HEENT:- [place].AT, No sclera icterus Neck-Supple Neck,No JVD,.  Lungs-  CTAB , fair symmetrical air movement CV- S1, S2 normal, irregular  Abd-  +ve B.Sounds, Abd Soft, No tenderness,    Extremity/Skin:- No  edema, pedal pulses present  Psych-affect is appropriate, oriented x3 Neuro-generalized weakness no new focal deficits, +ve Parkisonian tremors MSK-right hip postop wound clean dry and intact   Data Review:   Micro Results Recent Results (from the past 240 hour(s))  Resp Panel by RT-PCR (Flu A&B, Covid) Nasopharyngeal Swab  Status: None   Collection Time: 01/12/21  7:30 PM   Specimen: Nasopharyngeal Swab; Nasopharyngeal(NP) swabs in vial transport medium  Result Value Ref Range Status   SARS Coronavirus 2 by RT PCR NEGATIVE NEGATIVE Final    Comment: (NOTE) SARS-CoV-2 target nucleic acids are NOT DETECTED.  The SARS-CoV-2 RNA is generally detectable in upper respiratory specimens during the acute phase of infection. The lowest concentration of SARS-CoV-2 viral copies this assay can detect is 138 copies/mL. A negative result does not preclude SARS-Cov-2 infection and should not be used as the sole basis for treatment or other patient management decisions. A negative result may occur with  improper specimen collection/handling, submission of specimen other than nasopharyngeal swab, presence of viral mutation(s) within the areas targeted by this assay, and inadequate number of viral copies(<138 copies/mL). A negative result must be combined with clinical observations, patient history, and epidemiological information. The expected result is Negative.  Fact Sheet for Patients:  BloggerCourse.comhttps://www.fda.gov/media/152166/download  Fact Sheet for Healthcare Providers:   SeriousBroker.ithttps://www.fda.gov/media/152162/download  This test is no t yet approved or cleared by the [place] FDA and  has been authorized for detection and/or diagnosis of SARS-CoV-2 by FDA under an Emergency Use Authorization (EUA). This EUA will remain  in effect (meaning this test can be used) for the duration of the COVID-19 declaration under Section 564(b)(1) of the Act, 21 U.S.C.section 360bbb-3(b)(1), unless the authorization is terminated  or revoked sooner.       Influenza A by PCR NEGATIVE NEGATIVE Final   Influenza B by PCR NEGATIVE NEGATIVE Final    Comment: (NOTE) The Xpert Xpress SARS-CoV-2/FLU/RSV plus assay is intended as an aid in the diagnosis of influenza from Nasopharyngeal swab specimens and should not be used as a sole basis for treatment. Nasal washings and aspirates are unacceptable for Xpert Xpress SARS-CoV-2/FLU/RSV testing.  Fact Sheet for Patients: BloggerCourse.comhttps://www.fda.gov/media/152166/download  Fact Sheet for Healthcare Providers: SeriousBroker.ithttps://www.fda.gov/media/152162/download  This test is not yet approved or cleared by the [place] FDA and has been authorized for detection and/or diagnosis of SARS-CoV-2 by FDA under an Emergency Use Authorization (EUA). This EUA will remain in effect (meaning this test can be used) for the duration of the COVID-19 declaration under Section 564(b)(1) of the Act, 21 U.S.C. section 360bbb-3(b)(1), unless the authorization is terminated or revoked.  Performed at [facility], [address] [place], [place] [address]   Surgical PCR screen     Status: None   Collection Time: 01/13/21  9:30 AM   Specimen: Nasal Mucosa; Nasal Swab  Result Value Ref Range Status   MRSA, PCR NEGATIVE NEGATIVE Final   Staphylococcus aureus NEGATIVE NEGATIVE Final    Comment: (NOTE) The Xpert SA Assay (FDA approved for NASAL specimens in patients 85 years of age and older), is one component of a comprehensive surveillance program. It is not  intended to diagnose infection nor to guide or monitor treatment. Performed at [facility], [address] [place], [place] [address]     Radiology Reports DG CHEST PORT 1 VIEW  Result Date: 01/13/2021 CLINICAL DATA:  Preoperative evaluation for upcoming hip surgery EXAM: PORTABLE CHEST 1 VIEW COMPARISON:  01/08/2019 FINDINGS: Heart is mildly enlarged in size. Aortic calcifications are again seen and stable. The lungs are hyperinflated without focal infiltrate or sizable effusion. Mild apical scarring is noted. No bony abnormality is seen. IMPRESSION: COPD without acute abnormality. Electronically Signed   By: Alcide CleverMark  Lukens M.D.   On: 01/13/2021 08:28   DG HIP OPERATIVE  UNILAT W OR W/O PELVIS RIGHT  Result Date: 01/13/2021 CLINICAL DATA:  Recent fall with right intratrochanteric fracture EXAM: OPERATIVE RIGHT HIP WITH PELVIS COMPARISON:  None. FLUOROSCOPY TIME:  Radiation Exposure Index (as provided by the fluoroscopic device): Not available If the device does not provide the exposure index: Fluoroscopy Time:  1 minutes 48 seconds Number of Acquired Images:  18 FINDINGS: Initial images again demonstrate the intratrochanteric fracture of the right femur. Fracture fragments were produced. Medullary rod was then placed with compression screw traversing the femoral neck. Fracture fragments are in near anatomic alignment. IMPRESSION: ORIF of proximal right femoral fracture. Electronically Signed   By: Alcide Clever M.D.   On: 01/13/2021 16:22   DG HIP UNILAT WITH PELVIS 2-3 VIEWS RIGHT  Result Date: 01/13/2021 CLINICAL DATA:  Postop EXAM: DG HIP (WITH OR WITHOUT PELVIS) 2-3V RIGHT COMPARISON:  01/13/2021, 01/12/2021 FINDINGS: Interval intramedullary rodding and distal screw fixation of the right femur for comminuted intertrochanteric fracture. Pubic symphysis and rami are intact. Gas in the soft tissues consistent with recent surgery. IMPRESSION: Interval operative fixation of right intertrochanteric  fracture. Electronically Signed   By: Jasmine Pang M.D.   On: 01/13/2021 16:35   DG HIP UNILAT WITH PELVIS 2-3 VIEWS RIGHT  Result Date: 01/13/2021 CLINICAL DATA:  Status post fall. EXAM: DG HIP (WITH OR WITHOUT PELVIS) 2-3V LEFT COMPARISON:  None. FINDINGS: Acute, comminuted fracture deformity is seen extending through the inter trochanteric region of the proximal right femur. There is no evidence of dislocation. Mild degenerative changes are seen in the form of joint space narrowing and acetabular sclerosis. Moderate to marked severity vascular calcification is seen. IMPRESSION: Acute inter trochanteric fracture of the proximal right femur. Electronically Signed   By: Aram Candela M.D.   On: 01/12/2021 18:14     CBC Recent Labs  Lab 01/12/21 1752 01/13/21 0430 01/14/21 0550  WBC 8.4 8.4 13.2*  HGB 12.8* 11.7* 10.3*  HCT 39.9 36.5* 31.6*  PLT 178 133* 130*  MCV 96.1 95.3 96.3  MCH 30.8 30.5 31.4  MCHC 32.1 32.1 32.6  RDW 13.1 13.1 13.1  LYMPHSABS 0.6*  --   --   MONOABS 0.7  --   --   EOSABS 0.0  --   --   BASOSABS 0.1  --   --     Chemistries  Recent Labs  Lab 01/12/21 1752 01/13/21 0354 01/14/21 0550  NA 139 137 136  K 4.6 4.6 4.3  CL 105 106 103  CO2 26 26 22   GLUCOSE 120* 108* 113*  BUN 42* 39* 30*  CREATININE 1.74* 1.40* 1.26*  CALCIUM 8.7* 8.3* 8.4*  MG  --  1.9  --   AST  --  18  --   ALT  --  5  --   ALKPHOS  --  53  --   BILITOT  --  1.3*  --    ------------------------------------------------------------------------------------------------------------------ No results for input(s): CHOL, HDL, LDLCALC, TRIG, CHOLHDL, LDLDIRECT in the last 72 hours.  No results found for: HGBA1C ------------------------------------------------------------------------------------------------------------------ No results for input(s): TSH, T4TOTAL, T3FREE, THYROIDAB in the last 72 hours.  Invalid input(s):  FREET3 ------------------------------------------------------------------------------------------------------------------ No results for input(s): VITAMINB12, FOLATE, FERRITIN, TIBC, IRON, RETICCTPCT in the last 72 hours.  Coagulation profile Recent Labs  Lab 01/13/21 0354  INR 1.2   No results for input(s): DDIMER in the last 72 hours.  Cardiac Enzymes No results for input(s): CKMB, TROPONINI, MYOGLOBIN in the last 168 hours.  Invalid input(s):  CK ------------------------------------------------------------------------------------------------------------------ No results found for: BNP   Shon Hale M.D on 01/15/2021 at 3:05 PM  Go to [email or web address] - for contact info  [facility] - Office  [phone]

## 2021-01-15 NOTE — Progress Notes (Signed)
Pt called out to nurses station with prior complaint. This nurse assessed pt and called tele to confirm pts heart rhythm. Pt appears anxious and is stating " my heart feels like it is stopping and I keep falling asleep". PRN anxiety medication given and MD notified.

## 2021-01-16 DIAGNOSIS — W19XXXA Unspecified fall, initial encounter: Secondary | ICD-10-CM

## 2021-01-16 LAB — BASIC METABOLIC PANEL
Anion gap: 6 (ref 5–15)
BUN: 29 mg/dL — ABNORMAL HIGH (ref 8–23)
CO2: 25 mmol/L (ref 22–32)
Calcium: 7.6 mg/dL — ABNORMAL LOW (ref 8.9–10.3)
Chloride: 101 mmol/L (ref 98–111)
Creatinine, Ser: 1.12 mg/dL (ref 0.61–1.24)
GFR, Estimated: >60 mL/min (ref >60)
Glucose, Bld: 88 mg/dL (ref 70–99)
Potassium: 4.3 mmol/L (ref 3.5–5.1)
Sodium: 132 mmol/L — ABNORMAL LOW (ref 135–145)

## 2021-01-16 LAB — RESP PANEL BY RT-PCR (FLU A&B, COVID) ARPGX2
Influenza A by PCR: NEGATIVE
Influenza B by PCR: NEGATIVE
SARS Coronavirus 2 by RT PCR: NEGATIVE

## 2021-01-16 LAB — CBC
HCT: 27.7 % — ABNORMAL LOW (ref 39.0–52.0)
Hemoglobin: 8.9 g/dL — ABNORMAL LOW (ref 13.0–17.0)
MCH: 30.9 pg (ref 26.0–34.0)
MCHC: 32.1 g/dL (ref 30.0–36.0)
MCV: 96.2 fL (ref 80.0–100.0)
Platelets: 138 10*3/uL — ABNORMAL LOW (ref 150–400)
RBC: 2.88 MIL/uL — ABNORMAL LOW (ref 4.22–5.81)
RDW: 13 % (ref 11.5–15.5)
WBC: 8.9 10*3/uL (ref 4.0–10.5)
nRBC: 0 % (ref 0.0–0.2)

## 2021-01-16 NOTE — Progress Notes (Addendum)
Patient Demographics:    Jorge Lester, is a 87 y.o. male, DOB - Jan 23, 1934, JOA:[id number]  Admit date - 01/12/2021   Admitting Physician Jorge Shown, DO  Outpatient Primary MD for the patient is Jorge Slick, MD  LOS - 4  Chief Complaint  Patient presents with   Fall        Subjective:    Jorge Lester today has no fevers, no emesis,  No chest pain,  -Daughter and speech pathologist at bedside -Eating and drinking better, no new concerns -01/16/21 -Patient  Remains medically stable, awaiting transportation to go to [facility] for rehab -  Assessment  & Plan :    Principal Problem:   Right femoral fracture (HCC) Active Problems:   PARKINSON'S DISEASE   Essential hypertension   Coronary artery disease involving native coronary artery of native heart without angina pectoris   Hyperlipidemia   Insomnia, unspecified   Paroxysmal atrial fibrillation with RVR (HCC)   Normocytic anemia   AKI (acute kidney injury) (HCC)   Dehydration   Accidental fall   COPD (chronic obstructive pulmonary disease) (HCC)   BPH (benign prostatic hyperplasia)   Brief Summary:- 87 y.o. male with medical history significant for CAD s/p stent placement, COPD, GERD, Parkinson disease, hyperlipidemia admitted on 01/12/21 with Rt hip fracture after mechanical fall at home, s/p ORIF on 01/13/21 -01/16/21 -Patient  Remains medically stable, awaiting transportation to go to [facility] for rehab  A/p 1)Rt Hip Fx- comminuted slightly displaced right INTERTROCHANTERIC HIP FRACTURE--- s/p ORIF (IM Nail) on 01/13/21 Ortho Postoperative plan Weightbearing as tolerated Staples if present can be removed postop day 12-14 days Anticoagulation with ASA 325 mg daily for 28 days, after that backed on aspirin 81 mg daily for CAD/stents Follow-up visit at 4 weeks for x-rays and then x-rays at 6 weeks and 12 weeks    2)Paroxysmal atrial fibrillation with RVR CHA2DS2- VASc score   is = 4.8    Which is  equal to =  4.8% annual risk of stroke  Continue telemetry Rate is currently controlled, continue to monitor and manage accordingly No anticoagulant at this time  -- Currently on aspirin for DVT prophylaxis as above #1,  -Risk versus benefit of anticoagulation discussed with patient and patient's daughter  Ms Jorge Lester --she declines for anticoagulation at this time would rather do aspirin monotherapy due to High fall risk in the setting of Parkinson's disease with orthostatic hypotension  2)Acute kidney injury with no known history of CKD -Creatinine is down to 1.1 from 1.74 with hydration Continue gentle hydration Renally adjust medications, avoid nephrotoxic agents/dehydration/hypotension  3)Acute on chronic anemia --hemoglobin down to 8.9 from a baseline usually above 12 in the setting of acute blood loss secondary to acute right hip fracture and hemodilution ---Monitor closely and transfuse as clinically indicated  4)CAD s/p stent placement (PCI to LAD CTO 12/2012 with DES x2) -- 12/2015 nuclear stress low risk, possible mild ischemia Continue Zocor -PTA patient was on aspirin 81 mg daily patient will need aspirin 325 mg daily for post hip surgery DVT prophylaxis -CAD    5)Parkinson disease--- with h/o orthostatic Hypotension Continue Sinemet   6)BPH Continue doxazosin  7)COPD Continue albuterol as needed  8)Insomnia Continue  Seroquel  9)Leukocytosis--- WBC 13.2 suspect reactive in the postop patient  10)Hospital Delirium----suspect anesthesia and narcotic induced, may use as needed lorazepam continue to monitor --Resolved---   11) constipation--- laxatives as ordered  Disposition:- -01/16/21 -Patient  Remains medically stable, awaiting transportation to go to [facility] for rehab    DVT prophylaxis: SCDs/ASA 325 mg daily  Code Status :  -  Code Status: Full Code    Family Communication:    (patient is alert, awake and coherent)  Discussed with daughter-daughter  Ms Jorge Lester  at bedside Consults  :  ortho  DVT Prophylaxis  :   - SCDs  SCDs Start: 01/13/21 1636 SCDs Start: 01/12/21 2049  Lab Results  Component Value Date   PLT 138 (L) 01/16/2021    Inpatient Medications  Scheduled Meds:  ALPRAZolam  0.5 mg Oral Once   aspirin EC  325 mg Oral Q breakfast   carbidopa-levodopa  1 tablet Oral QHS   docusate sodium  100 mg Oral BID   doxazosin  2 mg Oral QHS   famotidine  20 mg Oral Daily   mupirocin ointment  1 application Nasal BID   polyethylene glycol  17 g Oral Daily   QUEtiapine  25 mg Oral QHS   senna-docusate  2 tablet Oral QHS   simvastatin  40 mg Oral Daily   traMADol  50 mg Oral Q6H   Continuous Infusions:  sodium chloride 20 mL/hr at 01/15/21 1625   methocarbamol (ROBAXIN) IV     PRN Meds:.albuterol, HYDROcodone-acetaminophen, HYDROmorphone (DILAUDID) injection, LORazepam, menthol-cetylpyridinium **OR** phenol, methocarbamol **OR** methocarbamol (ROBAXIN) IV, metoCLOPramide **OR** metoCLOPramide (REGLAN) injection, metoprolol tartrate, ondansetron **OR** ondansetron (ZOFRAN) IV   Anti-infectives (From admission, onward)    Start     Dose/Rate Route Frequency Ordered Stop   01/13/21 2000  ceFAZolin (ANCEF) IVPB 2g/100 mL premix        2 g 200 mL/hr over 30 Minutes Intravenous Every 6 hours 01/13/21 1635 01/14/21 0244   01/13/21 1145  ceFAZolin (ANCEF) IVPB 2g/100 mL premix        2 g 200 mL/hr over 30 Minutes Intravenous On call to O.R. 01/13/21 1052 01/13/21 1416         Objective:   Vitals:   01/15/21 1456 01/15/21 1626 01/15/21 2047 01/16/21 0537  BP: 119/75 (!) 149/78 (!) 151/84 128/71  Pulse: 99 (!) 107 (!) 105 97  Resp: 16  20 20   Temp: 97.6 F (36.4 C)  98.5 F (36.9 C) 97.7 F (36.5 C)  TempSrc: Oral   Oral  SpO2: 100% 96% 100% 93%  Weight:      Height:        Wt Readings from Last 3  Encounters:  01/12/21 67.6 kg  01/04/21 66.7 kg  10/12/20 68.5 kg     Intake/Output Summary (Last 24 hours) at 01/16/2021 1723 Last data filed at 01/16/2021 1200 Gross per 24 hour  Intake 720 ml  Output 400 ml  Net 320 ml   Physical Exam  Gen:- Awake Alert,  in no apparent distress  HEENT:- Centre.AT, No sclera icterus Neck-Supple Neck,No JVD,.  Lungs-  CTAB , fair symmetrical air movement CV- S1, S2 normal, irregular  Abd-  +ve B.Sounds, Abd Soft, No tenderness,    Extremity/Skin:- No  edema, pedal pulses present  Psych-affect is appropriate, oriented x3 Neuro-generalized weakness no new focal deficits, +ve Parkisonian tremors MSK-right hip postop wound clean dry and intact   Data Review:   Micro Results  Recent Results (from the past 240 hour(s))  Resp Panel by RT-PCR (Flu A&B, Covid) Nasopharyngeal Swab     Status: None   Collection Time: 01/12/21  7:30 PM   Specimen: Nasopharyngeal Swab; Nasopharyngeal(NP) swabs in vial transport medium  Result Value Ref Range Status   SARS Coronavirus 2 by RT PCR NEGATIVE NEGATIVE Final    Comment: (NOTE) SARS-CoV-2 target nucleic acids are NOT DETECTED.  The SARS-CoV-2 RNA is generally detectable in upper respiratory specimens during the acute phase of infection. The lowest concentration of SARS-CoV-2 viral copies this assay can detect is 138 copies/mL. A negative result does not preclude SARS-Cov-2 infection and should not be used as the sole basis for treatment or other patient management decisions. A negative result may occur with  improper specimen collection/handling, submission of specimen other than nasopharyngeal swab, presence of viral mutation(s) within the areas targeted by this assay, and inadequate number of viral copies(<138 copies/mL). A negative result must be combined with clinical observations, patient history, and epidemiological information. The expected result is Negative.  Fact Sheet for Patients:   BloggerCourse.comhttps://www.fda.gov/media/152166/download  Fact Sheet for Healthcare Providers:  SeriousBroker.ithttps://www.fda.gov/media/152162/download  This test is no t yet approved or cleared by the [place] FDA and  has been authorized for detection and/or diagnosis of SARS-CoV-2 by FDA under an Emergency Use Authorization (EUA). This EUA will remain  in effect (meaning this test can be used) for the duration of the COVID-19 declaration under Section 564(b)(1) of the Act, 21 U.S.C.section 360bbb-3(b)(1), unless the authorization is terminated  or revoked sooner.       Influenza A by PCR NEGATIVE NEGATIVE Final   Influenza B by PCR NEGATIVE NEGATIVE Final    Comment: (NOTE) The Xpert Xpress SARS-CoV-2/FLU/RSV plus assay is intended as an aid in the diagnosis of influenza from Nasopharyngeal swab specimens and should not be used as a sole basis for treatment. Nasal washings and aspirates are unacceptable for Xpert Xpress SARS-CoV-2/FLU/RSV testing.  Fact Sheet for Patients: BloggerCourse.comhttps://www.fda.gov/media/152166/download  Fact Sheet for Healthcare Providers: SeriousBroker.ithttps://www.fda.gov/media/152162/download  This test is not yet approved or cleared by the [place] FDA and has been authorized for detection and/or diagnosis of SARS-CoV-2 by FDA under an Emergency Use Authorization (EUA). This EUA will remain in effect (meaning this test can be used) for the duration of the COVID-19 declaration under Section 564(b)(1) of the Act, 21 U.S.C. section 360bbb-3(b)(1), unless the authorization is terminated or revoked.  Performed at [facility], [address] [place], [place] [address]   Surgical PCR screen     Status: None   Collection Time: 01/13/21  9:30 AM   Specimen: Nasal Mucosa; Nasal Swab  Result Value Ref Range Status   MRSA, PCR NEGATIVE NEGATIVE Final   Staphylococcus aureus NEGATIVE NEGATIVE Final    Comment: (NOTE) The Xpert SA Assay (FDA approved for NASAL specimens in patients 22 years of  age and older), is one component of a comprehensive surveillance program. It is not intended to diagnose infection nor to guide or monitor treatment. Performed at [facility], [address] [place], [place] [address]   Resp Panel by RT-PCR (Flu A&B, Covid) Nasopharyngeal Swab     Status: None   Collection Time: 01/16/21  2:10 PM   Specimen: Nasopharyngeal Swab; Nasopharyngeal(NP) swabs in vial transport medium  Result Value Ref Range Status   SARS Coronavirus 2 by RT PCR NEGATIVE NEGATIVE Final    Comment: (NOTE) SARS-CoV-2 target nucleic acids are NOT DETECTED.  The SARS-CoV-2 RNA  is generally detectable in upper respiratory specimens during the acute phase of infection. The lowest concentration of SARS-CoV-2 viral copies this assay can detect is 138 copies/mL. A negative result does not preclude SARS-Cov-2 infection and should not be used as the sole basis for treatment or other patient management decisions. A negative result may occur with  improper specimen collection/handling, submission of specimen other than nasopharyngeal swab, presence of viral mutation(s) within the areas targeted by this assay, and inadequate number of viral copies(<138 copies/mL). A negative result must be combined with clinical observations, patient history, and epidemiological information. The expected result is Negative.  Fact Sheet for Patients:  BloggerCourse.com  Fact Sheet for Healthcare Providers:  SeriousBroker.it  This test is no t yet approved or cleared by the [place] FDA and  has been authorized for detection and/or diagnosis of SARS-CoV-2 by FDA under an Emergency Use Authorization (EUA). This EUA will remain  in effect (meaning this test can be used) for the duration of the COVID-19 declaration under Section 564(b)(1) of the Act, 21 U.S.C.section 360bbb-3(b)(1), unless the authorization is terminated  or revoked sooner.        Influenza A by PCR NEGATIVE NEGATIVE Final   Influenza B by PCR NEGATIVE NEGATIVE Final    Comment: (NOTE) The Xpert Xpress SARS-CoV-2/FLU/RSV plus assay is intended as an aid in the diagnosis of influenza from Nasopharyngeal swab specimens and should not be used as a sole basis for treatment. Nasal washings and aspirates are unacceptable for Xpert Xpress SARS-CoV-2/FLU/RSV testing.  Fact Sheet for Patients: BloggerCourse.com  Fact Sheet for Healthcare Providers: SeriousBroker.it  This test is not yet approved or cleared by the [place] FDA and has been authorized for detection and/or diagnosis of SARS-CoV-2 by FDA under an Emergency Use Authorization (EUA). This EUA will remain in effect (meaning this test can be used) for the duration of the COVID-19 declaration under Section 564(b)(1) of the Act, 21 U.S.C. section 360bbb-3(b)(1), unless the authorization is terminated or revoked.  Performed at [facility], [address], [place], [place] [address]     Radiology Reports DG CHEST PORT 1 VIEW  Result Date: 01/13/2021 CLINICAL DATA:  Preoperative evaluation for upcoming hip surgery EXAM: PORTABLE CHEST 1 VIEW COMPARISON:  01/08/2019 FINDINGS: Heart is mildly enlarged in size. Aortic calcifications are again seen and stable. The lungs are hyperinflated without focal infiltrate or sizable effusion. Mild apical scarring is noted. No bony abnormality is seen. IMPRESSION: COPD without acute abnormality. Electronically Signed   By: Alcide Clever M.D.   On: 01/13/2021 08:28   DG HIP OPERATIVE UNILAT W OR W/O PELVIS RIGHT  Result Date: 01/13/2021 CLINICAL DATA:  Recent fall with right intratrochanteric fracture EXAM: OPERATIVE RIGHT HIP WITH PELVIS COMPARISON:  None. FLUOROSCOPY TIME:  Radiation Exposure Index (as provided by the fluoroscopic device): Not available If the device does not provide the exposure index: Fluoroscopy Time:  1  minutes 48 seconds Number of Acquired Images:  18 FINDINGS: Initial images again demonstrate the intratrochanteric fracture of the right femur. Fracture fragments were produced. Medullary rod was then placed with compression screw traversing the femoral neck. Fracture fragments are in near anatomic alignment. IMPRESSION: ORIF of proximal right femoral fracture. Electronically Signed   By: Alcide Clever M.D.   On: 01/13/2021 16:22   DG HIP UNILAT WITH PELVIS 2-3 VIEWS RIGHT  Result Date: 01/13/2021 CLINICAL DATA:  Postop EXAM: DG HIP (WITH OR WITHOUT PELVIS) 2-3V RIGHT COMPARISON:  01/13/2021, 01/12/2021 FINDINGS: Interval intramedullary  rodding and distal screw fixation of the right femur for comminuted intertrochanteric fracture. Pubic symphysis and rami are intact. Gas in the soft tissues consistent with recent surgery. IMPRESSION: Interval operative fixation of right intertrochanteric fracture. Electronically Signed   By: Jasmine Pang M.D.   On: 01/13/2021 16:35   DG HIP UNILAT WITH PELVIS 2-3 VIEWS RIGHT  Result Date: 01/13/2021 CLINICAL DATA:  Status post fall. EXAM: DG HIP (WITH OR WITHOUT PELVIS) 2-3V LEFT COMPARISON:  None. FINDINGS: Acute, comminuted fracture deformity is seen extending through the inter trochanteric region of the proximal right femur. There is no evidence of dislocation. Mild degenerative changes are seen in the form of joint space narrowing and acetabular sclerosis. Moderate to marked severity vascular calcification is seen. IMPRESSION: Acute inter trochanteric fracture of the proximal right femur. Electronically Signed   By: Aram Candela M.D.   On: 01/12/2021 18:14     CBC Recent Labs  Lab 01/12/21 1752 01/13/21 0430 01/14/21 0550 01/16/21 0550  WBC 8.4 8.4 13.2* 8.9  HGB 12.8* 11.7* 10.3* 8.9*  HCT 39.9 36.5* 31.6* 27.7*  PLT 178 133* 130* 138*  MCV 96.1 95.3 96.3 96.2  MCH 30.8 30.5 31.4 30.9  MCHC 32.1 32.1 32.6 32.1  RDW 13.1 13.1 13.1 13.0  LYMPHSABS 0.6*   --   --   --   MONOABS 0.7  --   --   --   EOSABS 0.0  --   --   --   BASOSABS 0.1  --   --   --     Chemistries  Recent Labs  Lab 01/12/21 1752 01/13/21 0354 01/14/21 0550 01/16/21 0550  NA 139 137 136 132*  K 4.6 4.6 4.3 4.3  CL 105 106 103 101  CO2 GLUCOSE 120* 108* 113* 88  BUN 42* 39* 30* 29*  CREATININE 1.74* 1.40* 1.26* 1.12  CALCIUM 8.7* 8.3* 8.4* 7.6*  MG  --  1.9  --   --   AST  --  18  --   --   ALT  --  5  --   --   ALKPHOS  --  53  --   --   BILITOT  --  1.3*  --   --    ------------------------------------------------------------------------------------------------------------------ No results for input(s): CHOL, HDL, LDLCALC, TRIG, CHOLHDL, LDLDIRECT in the last 72 hours.  No results found for: HGBA1C ------------------------------------------------------------------------------------------------------------------ No results for input(s): TSH, T4TOTAL, T3FREE, THYROIDAB in the last 72 hours.  Invalid input(s): FREET3 ------------------------------------------------------------------------------------------------------------------ No results for input(s): VITAMINB12, FOLATE, FERRITIN, TIBC, IRON, RETICCTPCT in the last 72 hours.  Coagulation profile Recent Labs  Lab 01/13/21 0354  INR 1.2   No results for input(s): DDIMER in the last 72 hours.  Cardiac Enzymes No results for input(s): CKMB, TROPONINI, MYOGLOBIN in the last 168 hours.  Invalid input(s): CK ------------------------------------------------------------------------------------------------------------------ No results found for: BNP   Shon Hale M.D on 01/16/2021 at 5:23 PM  Go to [email or web address] - for contact info  [facility] - Office  [phone]

## 2021-01-16 NOTE — TOC Progression Note (Signed)
Transition of Care [facility]) - Progression Note    Patient Details  Name: Jorge Lester MRN: [id number] Date of Birth: October 12, 1933  [facility] of [facility] CM/SW Contact  Barry Brunner, LCSW Phone Number: 01/16/2021, 12:55 PM  Clinical Narrative:    CSW contacted [facility] to inquire if they were able to take the patient. Revonda Standard with [facility] agreeable to take patient. CSW not able to schedule transport for patient due to no EMS availability. TOC to follow.    Expected Discharge Plan: Skilled Nursing Facility Barriers to Discharge: Continued Medical Work up  Expected Discharge Plan and Services Expected Discharge Plan: Skilled Nursing Facility     Post Acute Care Choice: NA Living arrangements for the past 2 months: Skilled Nursing Facility                                       Social Determinants of Health (SDOH) Interventions    Readmission Risk Interventions Readmission Risk Prevention Plan 01/13/2021  Transportation Screening Complete  PCP or Specialist Appt within 5-7 Days Complete  Home Care Screening Complete  Medication Review (RN CM) Complete  Some recent data might be hidden

## 2021-01-16 NOTE — Plan of Care (Signed)
  Problem: Acute Rehab OT Goals (only OT should resolve) Goal: Pt. Will Perform Eating Flowsheets (Taken 01/16/2021 1009) Pt Will Perform Eating:  with modified independence  sitting Goal: Pt. Will Perform Grooming Flowsheets (Taken 01/16/2021 1009) Pt Will Perform Grooming:  with modified independence  sitting Goal: Pt. Will Perform Upper Body Dressing Flowsheets (Taken 01/16/2021 1009) Pt Will Perform Upper Body Dressing:  with modified independence  sitting Goal: Pt. Will Transfer To Toilet Flowsheets (Taken 01/16/2021 1009) Pt Will Transfer to Toilet:  with min guard assist  stand pivot transfer  ambulating  regular height toilet  bedside commode Goal: Pt. Will Perform Toileting-Clothing Manipulation Flowsheets (Taken 01/16/2021 1009) Pt Will Perform Toileting - Clothing Manipulation and hygiene:  with supervision  sitting/lateral leans  sit to/from stand Goal: Pt/Caregiver Will Perform Home Exercise Program Flowsheets (Taken 01/16/2021 1009) Pt/caregiver will Perform Home Exercise Program:  Increased strength  Both right and left upper extremity  Independently  With written HEP provided

## 2021-01-16 NOTE — Evaluation (Signed)
Clinical/Bedside Swallow Evaluation Patient Details  Name: Jorge Lester MRN: [id number] Date of Birth: 02/05/1934  Today's Date: 01/16/2021 Time: SLP Start Time (ACUTE ONLY): 8527 SLP Stop Time (ACUTE ONLY): 0854 SLP Time Calculation (min) (ACUTE ONLY): 21 min  Past Medical History:  Past Medical History:  Diagnosis Date   Anemia    CAD (coronary artery disease)    a. CTO of LAD known since 2008, s/p DES x 2 to CTO 01/01/13. b. known residual nonobstructive disease in LCx, RCA.   COPD (chronic obstructive pulmonary disease) (HCC)    GERD (gastroesophageal reflux disease)    Gout    Hyperlipidemia    Hypertension    Mitral regurgitation    Prev mild, most recently trivial by echo 11/2012.   Orthostatic hypotension 06/04/2017   Parkinson's disease    Sleep apnea    Past Surgical History:  Past Surgical History:  Procedure Laterality Date   CARDIAC CATHETERIZATION  2008   CORONARY ANGIOPLASTY WITH STENT PLACEMENT  01/01/2013   "2" (01/01/2013)   LUMBAR DISC SURGERY  1974   PERCUTANEOUS CORONARY STENT INTERVENTION (PCI-S) N/A 01/01/2013   Procedure: PERCUTANEOUS CORONARY STENT INTERVENTION (PCI-S);  Surgeon: Peter M Swaziland, MD;  Location: [facility] CATH LAB;  Service: Cardiovascular;  Laterality: N/A;   TONSILLECTOMY AND ADENOIDECTOMY  1947   TRIGGER FINGER RELEASE  1980's   VASECTOMY     HPI:  87 y.o. male with medical history significant for CAD s/p stent placement, COPD, GERD, Parkinson disease, hyperlipidemia admitted on 01/12/21 with Rt hip fracture after mechanical fall at home, s/p ORIF on 01/13/21. BSE requested.   Assessment / Plan / Recommendation Clinical Impression  Clinical swallow evaluation completed at bedside with daughter present (x-ray tech in [place]). Pt alert and cooperative and had just finished breakfast meal. He has a h/o Parkinson's disease and takes medication to help control symptoms. Pt denies difficulty chewing and swallowing, however does indicate that he  chops his meats due to having upper plate and no lower dentition. Oral motor examination reveals mild lingual weakness and reduced coordination. Pt's vocal quality is clear with mild hypophonia. Pt demonstrated mildly prolonged oral prep with bacon and occasional delayed throat clear, otherwise no overt signs of reduced airway protection and no reports of globus. Pt and his daughter were educated on importance of adhering to aspiration precautions and taking his PD medications consistently. Both were appreciative for the visit. Recommend regular textures (Pt able to self regulate and ask for cutting meats as needed) and thin liquids with po medications whole with water when Pt is alert and upright. No further SLP services indicated at this time. SLP will sign off. SLP Visit Diagnosis: Dysphagia, unspecified (R13.10)    Aspiration Risk  No limitations    Diet Recommendation Regular;Thin liquid   Liquid Administration via: Cup;Straw Medication Administration: Whole meds with liquid Supervision: Patient able to self feed Compensations: Small sips/bites;Slow rate Postural Changes: Seated upright at 90 degrees;Remain upright for at least 30 minutes after po intake    Other  Recommendations Oral Care Recommendations: Oral care BID Other Recommendations: Clarify dietary restrictions   Follow up Recommendations None      Frequency and Duration            Prognosis Prognosis for Safe Diet Advancement: Good      Swallow Study   General Date of Onset: 01/12/21 HPI: 87 y.o. male with medical history significant for CAD s/p stent placement, COPD, GERD, Parkinson disease, hyperlipidemia admitted on 01/12/21  with Rt hip fracture after mechanical fall at home, s/p ORIF on 01/13/21. BSE requested. Type of Study: Bedside Swallow Evaluation Previous Swallow Assessment: N/A Diet Prior to this Study: Regular;Thin liquids Temperature Spikes Noted: No Respiratory Status: Room air Behavior/Cognition:  Alert;Cooperative;Pleasant mood Oral Cavity Assessment: Within Functional Limits Oral Care Completed by SLP: Recent completion by staff Oral Cavity - Dentition: Dentures, top Vision: Functional for self-feeding Self-Feeding Abilities: Able to feed self;Needs set up Patient Positioning: Upright in bed Baseline Vocal Quality: Normal;Low vocal intensity Volitional Cough: Strong Volitional Swallow: Able to elicit    Oral/Motor/Sensory Function Overall Oral Motor/Sensory Function: Mild impairment (lingual coordination and strength) Facial ROM: Within Functional Limits Facial Symmetry: Within Functional Limits Facial Strength: Within Functional Limits Lingual ROM: Reduced right;Reduced left Lingual Symmetry: Within Functional Limits Lingual Strength: Reduced Lingual Sensation: Within Functional Limits Velum: Within Functional Limits Mandible: Within Functional Limits   Ice Chips Ice chips: Not tested   Thin Liquid Thin Liquid: Within functional limits Presentation: Cup;Self Fed;Straw    Nectar Thick Nectar Thick Liquid: Not tested   Honey Thick Honey Thick Liquid: Not tested   Puree Puree: Within functional limits Presentation: Spoon   Solid     Solid: Impaired Presentation: Self Fed Oral Phase Impairments: Impaired mastication (upper dentures, mildly prolonged oral transit) Oral Phase Functional Implications: Prolonged oral transit;Impaired mastication Pharyngeal Phase Impairments: Throat Clearing - Delayed     Thank you,  Havery Moros, CCC-SLP [phone]  Jorge Lester 01/16/2021,8:57 AM

## 2021-01-16 NOTE — Progress Notes (Signed)
Physical Therapy Treatment Patient Details Name: Jorge Lester MRN: [id number] DOB: 1934/01/28 Today's Date: 01/16/2021    History of Present Illness Jorge Lester is a 87 y.o. male s/p right hip ORIF with CEPHALOMEDULLARY nail on 01/13/21    PT Comments    Pt supine in bed and willing to participate with OT and PT combined session for safety to advance functional abilities.  Pt slow labored movements in bed that required min assistance with trunk and Rt LE to sit on EOB.  Cueing for handplacement and elevated bed height to assist with standing and ability to take short steps to [facility].  Cueing to advance RW while ambulating from commade to chair with min A for safety.  EOS pt left in chair with call bell within reach and family member in room.   Follow Up Recommendations  SNF     Equipment Recommendations  None recommended by PT    Recommendations for Other Services       Precautions / Restrictions Precautions Precautions: Fall Restrictions Weight Bearing Restrictions: No RLE Weight Bearing: Weight bearing as tolerated    Mobility  Bed Mobility Overal bed mobility: Needs Assistance Bed Mobility: Supine to Sit     Supine to sit: Mod assist;HOB elevated Sit to supine: Mod assist   General bed mobility comments: slow labored movement, needs assist with RLE, bed elevated for pt height    Transfers Overall transfer level: Needs assistance Equipment used: Rolling walker (2 wheeled)   Sit to Stand: Mod assist         General transfer comment: slow labored movement, cuing for hand placement and foot positioning prior standing  Ambulation/Gait Ambulation/Gait assistance: Mod assist Gait Distance (Feet): 6 Feet Assistive device: Rolling walker (2 wheeled) Gait Pattern/deviations: Decreased stride length;Trunk flexed;Narrow base of support Gait velocity: slow   General Gait Details: Cueing to stand within walker and UE support to advance LE   Stairs              Wheelchair Mobility    Modified Rankin (Stroke Patients Only)       Balance                                            Cognition Arousal/Alertness: Awake/alert Behavior During Therapy: WFL for tasks assessed/performed Overall Cognitive Status: Within Functional Limits for tasks assessed                                        Exercises      General Comments        Pertinent Vitals/Pain Pain Assessment: No/denies pain    Home Living                      Prior Function            PT Goals (current goals can now be found in the care plan section)      Frequency    Min 5X/week      PT Plan Current plan remains appropriate    Co-evaluation   Reason for Co-Treatment: Complexity of the patient's impairments (multi-system involvement);For patient/therapist safety;To address functional/ADL transfers          AM-PAC PT "6 Clicks" Mobility   Outcome Measure  Help needed  turning from your back to your side while in a flat bed without using bedrails?: A Little Help needed moving from lying on your back to sitting on the side of a flat bed without using bedrails?: A Lot Help needed moving to and from a bed to a chair (including a wheelchair)?: A Lot Help needed standing up from a chair using your arms (e.g., wheelchair or bedside chair)?: A Lot Help needed to walk in hospital room?: A Lot Help needed climbing 3-5 steps with a railing? : Total 6 Click Score: 12    End of Session Equipment Utilized During Treatment: Gait belt Activity Tolerance: Patient tolerated treatment well;Patient limited by fatigue Patient left: in chair;with chair alarm set;with call bell/phone within reach;with family/visitor present Nurse Communication: Mobility status PT Visit Diagnosis: Unsteadiness on feet (R26.81);Difficulty in walking, not elsewhere classified (R26.2);Other abnormalities of gait and mobility (R26.89);Muscle weakness  (generalized) (M62.81)     Time: 2706-2376 PT Time Calculation (min) (ACUTE ONLY): 35 min  Charges:  $Therapeutic Activity: 8-22 mins                     Becky Sax, LPTA/CLT; CBIS [phone]  Jorge Lester 01/16/2021, 1:12 PM

## 2021-01-16 NOTE — Evaluation (Signed)
Occupational Therapy Evaluation Patient Details Name: Jorge Lester MRN: [id number] DOB: 31-Jan-1934 Today's Date: 01/16/2021    History of Present Illness Jorge Lester is a 87 y.o. male s/p right hip ORIF with CEPHALOMEDULLARY nail on 01/13/21   Clinical Impression   Pt agreeable to OT evaluation this am, session completed with PT as co-treat. Pt with hx of parkinson's, demonstrates slow movements and requiring increased time for motor planning during tasks. Pt requiring max assist for peri-care in static standing, using RW for support. Pt requiring significantly increased time for functional mobility from bed to [facility] to chair, with verbal cues for sequencing. Daughter present for session and reports prior to fall and sx pt was independent and did not use any DME. Recommend SNF on discharge to improve pt safety and independence in ADL and functional mobility tasks.     Follow Up Recommendations  SNF    Equipment Recommendations  None recommended by OT       Precautions / Restrictions Precautions Precautions: Fall Restrictions Weight Bearing Restrictions: Yes RLE Weight Bearing: Weight bearing as tolerated      Mobility Bed Mobility Overal bed mobility: Needs Assistance Bed Mobility: Supine to Sit     Supine to sit: Mod assist;HOB elevated     General bed mobility comments: slow labored movement, needs assist with RLE, bed elevated for pt height    Transfers Overall transfer level: Needs assistance Equipment used: Rolling walker (2 wheeled) Transfers: Sit to/from [employer] Sit to Stand: Mod assist Stand pivot transfers: Min assist       General transfer comment: slow labored movement, cuing for hand placement        ADL either performed or assessed with clinical judgement   ADL Overall ADL's : Needs assistance/impaired     Grooming: Set up;Sitting   Upper Body Bathing: Moderate assistance;Sitting   Lower Body Bathing: Maximal  assistance;Sitting/lateral leans;Sit to/from stand   Upper Body Dressing : Moderate assistance;Sitting   Lower Body Dressing: Maximal assistance;Sitting/lateral leans;Sit to/from stand   Toilet Transfer: Minimal assistance;+2 for physical assistance;Stand-pivot;BSC;RW Toilet Transfer Details (indicate cue type and reason): Pt requiring increased time and cuing for sequencing LE mobility, hand placement Toileting- Clothing Manipulation and Hygiene: Maximal assistance;Sit to/from stand Toileting - Clothing Manipulation Details (indicate cue type and reason): Pt requiring max assist with peri-care, assist with static standing at RW during task     Functional mobility during ADLs: Minimal assistance;Rolling walker;Cueing for safety;Cueing for sequencing General ADL Comments: Pt with difficulty with BLE coordination and motor planning during functional mobility     Vision Baseline Vision/History: No visual deficits Patient Visual Report: No change from baseline Vision Assessment?: No apparent visual deficits            Pertinent Vitals/Pain Pain Assessment: No/denies pain     Hand Dominance Right   Extremity/Trunk Assessment Upper Extremity Assessment Upper Extremity Assessment: Generalized weakness   Lower Extremity Assessment Lower Extremity Assessment: Defer to PT evaluation   Cervical / Trunk Assessment Cervical / Trunk Assessment: Kyphotic   Communication Communication Communication: No difficulties   Cognition Arousal/Alertness: Awake/alert Behavior During Therapy: WFL for tasks assessed/performed Overall Cognitive Status: Within Functional Limits for tasks assessed                                                Home Living Family/patient  expects to be discharged to:: Skilled nursing facility Living Arrangements: Alone Available Help at Discharge: Family Type of Home: House Home Access: Stairs to enter [employer] of Steps:  2 Entrance Stairs-Rails: None Home Layout: One level     Bathroom Shower/Tub: Chief Strategy Officer: Handicapped height     Home Equipment: Environmental consultant - 2 wheels          Prior Functioning/Environment Level of Independence: Independent        Comments: Daughter reports independence in ADLs and mobility        OT Problem List: Decreased strength;Decreased activity tolerance;Impaired balance (sitting and/or standing);Decreased coordination;Decreased safety awareness;Decreased knowledge of use of DME or AE;Cardiopulmonary status limiting activity;Pain      OT Treatment/Interventions: Self-care/ADL training;Therapeutic exercise;DME and/or AE instruction;Therapeutic activities;Patient/family education    OT Goals(Current goals can be found in the care plan section) Acute Rehab OT Goals Patient Stated Goal: To get stronger and go home OT Goal Formulation: With patient Time For Goal Achievement: 01/30/21 Potential to Achieve Goals: Good  OT Frequency: Min 2X/week           Co-evaluation PT/OT/SLP Co-Evaluation/Treatment: Yes Reason for Co-Treatment: Complexity of the patient's impairments (multi-system involvement);For patient/therapist safety;To address functional/ADL transfers   OT goals addressed during session: ADL's and self-care;Proper use of Adaptive equipment and DME         End of Session Equipment Utilized During Treatment: Gait belt;Rolling walker Nurse Communication: Mobility status  Activity Tolerance: Patient limited by fatigue Patient left: in chair;with call bell/phone within reach;with chair alarm set  OT Visit Diagnosis: Unsteadiness on feet (R26.81);Repeated falls (R29.6);Muscle weakness (generalized) (M62.81);Pain Pain - Right/Left: Right Pain - part of body: Hip                Time: 2831-5176 OT Time Calculation (min): 41 min Charges:  OT General Charges $OT Visit: 1 Visit OT Evaluation $OT Eval Low Complexity: 1 Low   Jorge Lester, OTR/L  [phone] 01/16/2021, 10:05 AM

## 2021-01-17 ENCOUNTER — Encounter (HOSPITAL_COMMUNITY): Payer: Self-pay | Admitting: Orthopedic Surgery

## 2021-01-17 LAB — TYPE AND SCREEN
ABO/RH(D): A POS
Antibody Screen: NEGATIVE
Unit division: 0

## 2021-01-17 LAB — CBC
HCT: 28.1 % — ABNORMAL LOW (ref 39.0–52.0)
Hemoglobin: 9 g/dL — ABNORMAL LOW (ref 13.0–17.0)
MCH: 30.7 pg (ref 26.0–34.0)
MCHC: 32 g/dL (ref 30.0–36.0)
MCV: 95.9 fL (ref 80.0–100.0)
Platelets: 166 10*3/uL (ref 150–400)
RBC: 2.93 MIL/uL — ABNORMAL LOW (ref 4.22–5.81)
RDW: 12.9 % (ref 11.5–15.5)
WBC: 7.6 10*3/uL (ref 4.0–10.5)
nRBC: 0 % (ref 0.0–0.2)

## 2021-01-17 LAB — BPAM RBC
Blood Product Expiration Date: 202207292359
Unit Type and Rh: 6200

## 2021-01-17 MED ORDER — STIOLTO RESPIMAT 2.5-2.5 MCG/ACT IN AERS: 2.0000 | INHALATION_SPRAY | Freq: Every day | RESPIRATORY_TRACT | 0 refills | Status: DC

## 2021-01-17 MED ORDER — METHOCARBAMOL 500 MG PO TABS: 500.0000 mg | ORAL_TABLET | Freq: Two times a day (BID) | ORAL | 0 refills | Status: DC

## 2021-01-17 MED ORDER — ALPRAZOLAM 0.5 MG PO TABS: 0.5000 mg | ORAL_TABLET | Freq: Two times a day (BID) | ORAL | 0 refills | Status: DC | PRN

## 2021-01-17 MED ORDER — OXYCODONE HCL 5 MG PO TABS: 5.0000 mg | ORAL_TABLET | ORAL | 0 refills | Status: DC | PRN

## 2021-01-17 MED ORDER — POLYETHYLENE GLYCOL 3350 17 G PO PACK: 17.0000 g | PACK | Freq: Every day | ORAL | 0 refills | Status: DC

## 2021-01-17 MED ORDER — METOPROLOL SUCCINATE ER 25 MG PO TB24: 25.0000 mg | ORAL_TABLET | Freq: Every day | ORAL | 11 refills | Status: DC

## 2021-01-17 MED ORDER — ONDANSETRON HCL 4 MG PO TABS: 4.0000 mg | ORAL_TABLET | Freq: Four times a day (QID) | ORAL | 0 refills | Status: DC | PRN

## 2021-01-17 MED ORDER — SENNOSIDES-DOCUSATE SODIUM 8.6-50 MG PO TABS: 2.0000 | ORAL_TABLET | Freq: Every day | ORAL | 2 refills | Status: DC

## 2021-01-17 MED ORDER — FAMOTIDINE 20 MG PO TABS: 20.0000 mg | ORAL_TABLET | Freq: Every day | ORAL | 1 refills | Status: DC

## 2021-01-17 MED ORDER — ASPIRIN 325 MG PO TBEC: 325.0000 mg | DELAYED_RELEASE_TABLET | Freq: Every day | ORAL | 0 refills | Status: DC

## 2021-01-17 NOTE — TOC Transition Note (Signed)
Transition of Care [facility]) - CM/SW Discharge Note   Patient Details  Name: Jorge Lester MRN: [id number] Date of Birth: 01-30-1934  [facility] CM/SW Contact:  Barry Brunner, LCSW Phone Number: 01/17/2021, 1:58 PM   Clinical Narrative:    [facility] agreeable to take patient. CSW completed med necessity and called EMS. Nurse to call report. TOC signing off.    Final next level of care: Skilled Nursing Facility Barriers to Discharge: Barriers Resolved   Patient Goals and CMS Choice Patient states their goals for this hospitalization and ongoing recovery are:: Rehab with SNF CMS Medicare.gov Compare Post Acute Care list provided to:: Patient Choice offered to / list presented to : Patient  Discharge Placement                Patient to be transferred to facility by: [facility] Name of family member notified: O'Dell,Deborah (Daughter)   [phone] Patient and family notified of of transfer: 01/17/21  Discharge Plan and Services     Post Acute Care Choice: NA                               Social Determinants of Health (SDOH) Interventions     Readmission Risk Interventions Readmission Risk Prevention Plan 01/13/2021  Transportation Screening Complete  PCP or Specialist Appt within 5-7 Days Complete  Home Care Screening Complete  Medication Review (RN CM) Complete  Some recent data might be hidden

## 2021-01-17 NOTE — Care Management Important Message (Signed)
Important Message  Patient Details  Name: Jorge Lester MRN: [id number] Date of Birth: 13-Aug-1933   Medicare Important Message Given:  Yes     Corey Harold 01/17/2021, 12:03 PM

## 2021-01-17 NOTE — Discharge Instructions (Signed)
1)Rt Hip surgery---Orthopedic Postoperative plan Weightbearing as tolerated Staples if present can be removed postop day 12-14 days Anticoagulation with ASA 325 mg daily for 28 days, after that backed on aspirin 81 mg daily for CAD/stents Follow-up visit with orthopedic surgeon at 4 weeks for x-rays and then x-rays at 6 weeks and 12 weeks   2)Avoid ibuprofen/Advil/Aleve/Motrin/Goody Powders/Naproxen/BC powders/Meloxicam/Diclofenac/Indomethacin and other Nonsteroidal anti-inflammatory medications as these will make you more likely to bleed and can cause stomach ulcers, can also cause Kidney problems.   3) repeat CBC and BMP on Monday, 01/23/2021

## 2021-01-17 NOTE — Discharge Summary (Signed)
Jorge Lester, is a 87 y.o. male  DOB 1934-05-19  MRN [id number].  Admission date:  01/12/2021  Admitting Physician  Frankey Shown, DO  Discharge Date:  01/17/2021   Primary MD  Suzan Slick, MD  Recommendations for primary care physician for things to follow:   1)Rt Hip surgery---Orthopedic Postoperative plan Weightbearing as tolerated Staples if present can be removed postop day 12-14 days Anticoagulation with ASA 325 mg daily for 28 days, after that backed on aspirin 81 mg daily for CAD/stents Follow-up visit with orthopedic surgeon at 4 weeks for x-rays and then x-rays at 6 weeks and 12 weeks   2)Avoid ibuprofen/Advil/Aleve/Motrin/Goody Powders/Naproxen/BC powders/Meloxicam/Diclofenac/Indomethacin and other Nonsteroidal anti-inflammatory medications as these will make you more likely to bleed and can cause stomach ulcers, can also cause Kidney problems.   3) repeat CBC and BMP on Monday, 01/23/2021  Admission Diagnosis  Preop examination [Z01.818] Right femoral fracture (HCC) [S72.91XA] Closed fracture of right hip, initial encounter [facility]) [S72.001A]   Discharge Diagnosis  Preop examination [Z01.818] Right femoral fracture (HCC) [S72.91XA] Closed fracture of right hip, initial encounter (HCC) [S72.001A]    Principal Problem:   Right femoral fracture (HCC) Active Problems:   PARKINSON'S DISEASE   Essential hypertension   Coronary artery disease involving native coronary artery of native heart without angina pectoris   Hyperlipidemia   Insomnia, unspecified   Paroxysmal atrial fibrillation with RVR (HCC)   Normocytic anemia   AKI (acute kidney injury) (HCC)   Dehydration   Accidental fall   COPD (chronic obstructive pulmonary disease) (HCC)   BPH (benign prostatic hyperplasia)      Past Medical History:  Diagnosis Date   Anemia    CAD (coronary artery disease)    a. CTO of  LAD known since 2008, s/p DES x 2 to CTO 01/01/13. b. known residual nonobstructive disease in LCx, RCA.   COPD (chronic obstructive pulmonary disease) (HCC)    GERD (gastroesophageal reflux disease)    Gout    Hyperlipidemia    Hypertension    Mitral regurgitation    Prev mild, most recently trivial by echo 11/2012.   Orthostatic hypotension 06/04/2017   Parkinson's disease    Sleep apnea     Past Surgical History:  Procedure Laterality Date   CARDIAC CATHETERIZATION  2008   CORONARY ANGIOPLASTY WITH STENT PLACEMENT  01/01/2013   "2" (01/01/2013)   LUMBAR DISC SURGERY  1974   PERCUTANEOUS CORONARY STENT INTERVENTION (PCI-S) N/A 01/01/2013   Procedure: PERCUTANEOUS CORONARY STENT INTERVENTION (PCI-S);  Surgeon: Peter M Swaziland, MD;  Location: [facility] CATH LAB;  Service: Cardiovascular;  Laterality: N/A;   TONSILLECTOMY AND ADENOIDECTOMY  1947   TRIGGER FINGER RELEASE  1980's   VASECTOMY       HPI  from the history and physical done on the day of admission:   Chief Complaint: Right hip pain   HPI: Jorge Lester is a 87 y.o. male with medical history significant for CAD s/p stent placement, COPD, GERD, Parkinson disease, hyperlipidemia who  presents to the emergency department after sustaining a fall at home few hours PTA.  Patient states that he folded a step down ladder and placed it against the wall at home when he accidentally stepped on it and lost his balance thereby sustaining a fall with subsequent right hip pain and difficulty in being able to sustain weight on the right leg.  EMS was activated, patient was taken to the ED for further evaluation and management.  Patient denies hitting his head and denies numbness or tingling of the extremities.     ED Course:  In the emergency department, he was tachycardic, BP was 154/69, other vital signs were within normal range.  Work-up in the ED showed normocytic anemia, BUN to creatinine 42/1.74 (no recent labs for comparison) Right hip x-ray  showed acute inter trochanteric fracture of the proximal right femur. Dilaudid was given for pain, IV hydration was provided, orthopedic surgeon was consulted and will see patient in the morning per ED physician.  Hospitalist was asked to admit patient for further evaluation and management       Hospital Course:     Brief Summary:- 87 y.o. male with medical history significant for CAD s/p stent placement, COPD, GERD, Parkinson disease, hyperlipidemia admitted on 01/12/21 with Rt hip fracture after mechanical fall at home, s/p ORIF on 01/13/21    A/p 1)Rt Hip Fx- comminuted slightly displaced right INTERTROCHANTERIC HIP FRACTURE--- s/p ORIF (IM Nail) on 01/13/21 Ortho Postoperative plan Weightbearing as tolerated Staples if present can be removed postop day 12-14 days Anticoagulation with ASA 325 mg daily for 28 days, after that backed on aspirin 81 mg daily for CAD/stents Follow-up visit at 4 weeks for x-rays and then x-rays at 6 weeks and 12 weeks    2)Paroxysmal atrial fibrillation with RVR CHA2DS2- VASc score   is = 4.8    Which is  equal to =  4.8% annual risk of stroke  -Use metoprolol for rate control No anticoagulant at this time -- Currently on aspirin for DVT prophylaxis as above #1, -Risk versus benefit of anticoagulation discussed with patient and patient's daughter  [name] --she declines for anticoagulation at this time would rather do aspirin monotherapy due to High fall risk in the setting of Parkinson's disease with orthostatic hypotension   2)Acute kidney injury with no known history of CKD -Creatinine is down to 1.1 from 1.74 with hydration Continue gentle hydration Renally adjust medications, avoid nephrotoxic agents/dehydration/hypotension  3)Acute on chronic anemia --hemoglobin down to 9.0 from a baseline usually above 12 in the setting of acute blood loss secondary to acute right hip fracture and hemodilution ---Monitor closely and transfuse as clinically  indicated  4)CAD s/p stent placement (PCI to LAD CTO 12/2012 with DES x2) -- 12/2015 nuclear stress low risk, possible mild ischemia Continue Zocor -PTA patient was on aspirin 81 mg daily patient will need aspirin 325 mg daily for post hip surgery DVT prophylaxis -Metoprolol as ordered  5)Parkinson disease--- with h/o orthostatic Hypotension Continue Sinemet   6)BPH Continue doxazosin  7)COPD Continue albuterol as needed  8)Insomnia Continue Seroquel   9)Leukocytosis--- WBC 13.2 suspect reactive in the postop patient -Leukocytosis has resolved   10)Hospital Delirium----suspect anesthesia and narcotic induced, may use as needed lorazepam continue to monitor --Resolved---  he is coherent and alert and oriented x4   11) constipation--- laxatives as ordered     DVT prophylaxis: SCDs/ASA 325 mg daily   Code Status :  -  Code Status: Full  Code    Family Communication:    (patient is alert, awake and coherent) Discussed with daughter-daughter  [name]  at bedside and son in law Consults  :  ortho   Discharge Condition: stable  Follow UP   Contact information for after-discharge care     Destination     [facility] Preferred SNF .   Service: Skilled Nursing Contact information: [address] [place] [address] [phone]                      Consults obtained -ortho  Diet and Activity recommendation:  As advised  Discharge Instructions   Discharge Instructions     Call MD for:  difficulty breathing, headache or visual disturbances   Complete by: As directed    Call MD for:  persistant dizziness or light-headedness   Complete by: As directed    Call MD for:  persistant nausea and vomiting   Complete by: As directed    Call MD for:  redness, tenderness, or signs of infection (pain, swelling, redness, odor or green/yellow discharge around incision site)   Complete by: As directed    Call MD for:  severe  uncontrolled pain   Complete by: As directed    Call MD for:  temperature >100.4   Complete by: As directed    Diet - low sodium heart healthy   Complete by: As directed    Discharge instructions   Complete by: As directed    1)Rt Hip surgery---Orthopedic Postoperative plan Weightbearing as tolerated Staples if present can be removed postop day 12-14 days Anticoagulation with ASA 325 mg daily for 28 days, after that backed on aspirin 81 mg daily for CAD/stents Follow-up visit with orthopedic surgeon at 4 weeks for x-rays and then x-rays at 6 weeks and 12 weeks   2)Avoid ibuprofen/Advil/Aleve/Motrin/Goody Powders/Naproxen/BC powders/Meloxicam/Diclofenac/Indomethacin and other Nonsteroidal anti-inflammatory medications as these will make you more likely to bleed and can cause stomach ulcers, can also cause Kidney problems.   3) repeat CBC and BMP on Monday, 01/23/2021   Discharge wound care:   Complete by: As directed    Keep right hip wound clean and dry   Increase activity slowly   Complete by: As directed          Discharge Medications     Allergies as of 01/17/2021       Reactions   Amantadines    Hallucinations   Sulfonamide Derivatives         Medication List     STOP taking these medications    aspirin 81 MG tablet Replaced by: aspirin 325 MG EC tablet       TAKE these medications    acetaminophen 500 MG tablet Commonly known as: TYLENOL Take 1,000 mg by mouth every 6 (six) hours as needed. What changed: Another medication with the same name was removed. Continue taking this medication, and follow the directions you see here.   albuterol 108 (90 Base) MCG/ACT inhaler Commonly known as: VENTOLIN HFA Inhale 2 puffs into the lungs every 4 (four) hours as needed for wheezing or shortness of breath. What changed: Another medication with the same name was removed. Continue taking this medication, and follow the directions you see here.   ALPRAZolam 0.5 MG  tablet Commonly known as: XANAX Take 1 tablet (0.5 mg total) by mouth 2 (two) times daily as needed for anxiety or sleep.   aspirin 325 MG EC tablet  Take 1 tablet (325 mg total) by mouth daily with breakfast. Start taking on: January 18, 2021 Replaces: aspirin 81 MG tablet   Bevespi Aerosphere 9-4.8 MCG/ACT Aero Generic drug: Glycopyrrolate-Formoterol Inhale 2 puffs into the lungs in the morning and at bedtime.   carbidopa-levodopa 25-100 MG tablet Commonly known as: SINEMET IR TAKE 1 TABLET BY MOUTH EVERY MORNING, AT NOON AND TAKE 1/2 TABLET BY MOUTH EVERY EVENING   carbidopa-levodopa 50-200 MG tablet Commonly known as: SINEMET CR 1/2 tablet in the morning and midday, 1 tablet at 6 PM and Midnight   doxazosin 2 MG tablet Commonly known as: CARDURA Take 2 mg by mouth at bedtime. What changed: Another medication with the same name was removed. Continue taking this medication, and follow the directions you see here.   famotidine 20 MG tablet Commonly known as: PEPCID Take 1 tablet (20 mg total) by mouth daily. What changed: See the new instructions.   methocarbamol 500 MG tablet Commonly known as: ROBAXIN Take 1 tablet (500 mg total) by mouth 2 (two) times daily.   metoprolol succinate 25 MG 24 hr tablet Commonly known as: Toprol XL Take 1 tablet (25 mg total) by mouth daily.   mirtazapine 15 MG tablet Commonly known as: REMERON TAKE 1 TABLET BY MOUTH AT BEDTIME   nitroGLYCERIN 0.4 MG SL tablet Commonly known as: NITROSTAT Place 1 tablet (0.4 mg total) under the tongue every 5 (five) minutes as needed.   ondansetron 4 MG tablet Commonly known as: ZOFRAN Take 1 tablet (4 mg total) by mouth every 6 (six) hours as needed for nausea.   oxyCODONE 5 MG immediate release tablet Commonly known as: Roxicodone Take 1 tablet (5 mg total) by mouth every 4 (four) hours as needed for severe pain.   polyethylene glycol 17 g packet Commonly known as: MIRALAX / GLYCOLAX Take 17 g by  mouth daily. Start taking on: January 18, 2021   QUEtiapine 25 MG tablet Commonly known as: SEROQUEL TAKE 1 TABLET BY MOUTH AT BEDTIME   senna-docusate 8.6-50 MG tablet Commonly known as: Senokot-S Take 2 tablets by mouth at bedtime.   simvastatin 40 MG tablet Commonly known as: ZOCOR Take 1 tablet by mouth daily. Changed by Dr. Margo Common What changed: Another medication with the same name was removed. Continue taking this medication, and follow the directions you see here.   Stiolto Respimat 2.5-2.5 MCG/ACT Aers Generic drug: Tiotropium Bromide-Olodaterol Inhale 2 puffs into the lungs daily. What changed:  how much to take when to take this       ASK your doctor about these medications    ropinirole 5 MG tablet Commonly known as: REQUIP TAKE 1 TABLET BY MOUTH BEFORE bedtime               Discharge Care Instructions  (From admission, onward)           Start     Ordered   01/17/21 0000  Discharge wound care:       Comments: Keep right hip wound clean and dry   01/17/21 1222            Major procedures and Radiology Reports - PLEASE review detailed and final reports for all details, in brief -   DG CHEST PORT 1 VIEW  Result Date: 01/13/2021 CLINICAL DATA:  Preoperative evaluation for upcoming hip surgery EXAM: PORTABLE CHEST 1 VIEW COMPARISON:  01/08/2019 FINDINGS: Heart is mildly enlarged in size. Aortic calcifications are again seen and stable. The lungs are  hyperinflated without focal infiltrate or sizable effusion. Mild apical scarring is noted. No bony abnormality is seen. IMPRESSION: COPD without acute abnormality. Electronically Signed   By: Alcide CleverMark  Lukens M.D.   On: 01/13/2021 08:28   DG HIP OPERATIVE UNILAT W OR W/O PELVIS RIGHT  Result Date: 01/13/2021 CLINICAL DATA:  Recent fall with right intratrochanteric fracture EXAM: OPERATIVE RIGHT HIP WITH PELVIS COMPARISON:  None. FLUOROSCOPY TIME:  Radiation Exposure Index (as provided by the fluoroscopic  device): Not available If the device does not provide the exposure index: Fluoroscopy Time:  1 minutes 48 seconds Number of Acquired Images:  18 FINDINGS: Initial images again demonstrate the intratrochanteric fracture of the right femur. Fracture fragments were produced. Medullary rod was then placed with compression screw traversing the femoral neck. Fracture fragments are in near anatomic alignment. IMPRESSION: ORIF of proximal right femoral fracture. Electronically Signed   By: Alcide CleverMark  Lukens M.D.   On: 01/13/2021 16:22   DG HIP UNILAT WITH PELVIS 2-3 VIEWS RIGHT  Result Date: 01/13/2021 CLINICAL DATA:  Postop EXAM: DG HIP (WITH OR WITHOUT PELVIS) 2-3V RIGHT COMPARISON:  01/13/2021, 01/12/2021 FINDINGS: Interval intramedullary rodding and distal screw fixation of the right femur for comminuted intertrochanteric fracture. Pubic symphysis and rami are intact. Gas in the soft tissues consistent with recent surgery. IMPRESSION: Interval operative fixation of right intertrochanteric fracture. Electronically Signed   By: Jasmine PangKim  Fujinaga M.D.   On: 01/13/2021 16:35   DG HIP UNILAT WITH PELVIS 2-3 VIEWS RIGHT  Result Date: 01/13/2021 CLINICAL DATA:  Status post fall. EXAM: DG HIP (WITH OR WITHOUT PELVIS) 2-3V LEFT COMPARISON:  None. FINDINGS: Acute, comminuted fracture deformity is seen extending through the inter trochanteric region of the proximal right femur. There is no evidence of dislocation. Mild degenerative changes are seen in the form of joint space narrowing and acetabular sclerosis. Moderate to marked severity vascular calcification is seen. IMPRESSION: Acute inter trochanteric fracture of the proximal right femur. Electronically Signed   By: Aram Candelahaddeus  Houston M.D.   On: 01/12/2021 18:14    Micro Results  Recent Results (from the past 240 hour(s))  Resp Panel by RT-PCR (Flu A&B, Covid) Nasopharyngeal Swab     Status: None   Collection Time: 01/12/21  7:30 PM   Specimen: Nasopharyngeal Swab;  Nasopharyngeal(NP) swabs in vial transport medium  Result Value Ref Range Status   SARS Coronavirus 2 by RT PCR NEGATIVE NEGATIVE Final    Comment: (NOTE) SARS-CoV-2 target nucleic acids are NOT DETECTED.  The SARS-CoV-2 RNA is generally detectable in upper respiratory specimens during the acute phase of infection. The lowest concentration of SARS-CoV-2 viral copies this assay can detect is 138 copies/mL. A negative result does not preclude SARS-Cov-2 infection and should not be used as the sole basis for treatment or other patient management decisions. A negative result may occur with  improper specimen collection/handling, submission of specimen other than nasopharyngeal swab, presence of viral mutation(s) within the areas targeted by this assay, and inadequate number of viral copies(<138 copies/mL). A negative result must be combined with clinical observations, patient history, and epidemiological information. The expected result is Negative.  Fact Sheet for Patients:  BloggerCourse.comhttps://www.fda.gov/media/152166/download  Fact Sheet for Healthcare Providers:  SeriousBroker.ithttps://www.fda.gov/media/152162/download  This test is no t yet approved or cleared by the [place] FDA and  has been authorized for detection and/or diagnosis of SARS-CoV-2 by FDA under an Emergency Use Authorization (EUA). This EUA will remain  in effect (meaning this test can be used) for the  duration of the COVID-19 declaration under Section 564(b)(1) of the Act, 21 U.S.C.section 360bbb-3(b)(1), unless the authorization is terminated  or revoked sooner.       Influenza A by PCR NEGATIVE NEGATIVE Final   Influenza B by PCR NEGATIVE NEGATIVE Final    Comment: (NOTE) The Xpert Xpress SARS-CoV-2/FLU/RSV plus assay is intended as an aid in the diagnosis of influenza from Nasopharyngeal swab specimens and should not be used as a sole basis for treatment. Nasal washings and aspirates are unacceptable for Xpert Xpress  SARS-CoV-2/FLU/RSV testing.  Fact Sheet for Patients: BloggerCourse.com  Fact Sheet for Healthcare Providers: SeriousBroker.it  This test is not yet approved or cleared by the [place] FDA and has been authorized for detection and/or diagnosis of SARS-CoV-2 by FDA under an Emergency Use Authorization (EUA). This EUA will remain in effect (meaning this test can be used) for the duration of the COVID-19 declaration under Section 564(b)(1) of the Act, 21 U.S.C. section 360bbb-3(b)(1), unless the authorization is terminated or revoked.  Performed at [facility], [address] [place], [place] [address]   Surgical PCR screen     Status: None   Collection Time: 01/13/21  9:30 AM   Specimen: Nasal Mucosa; Nasal Swab  Result Value Ref Range Status   MRSA, PCR NEGATIVE NEGATIVE Final   Staphylococcus aureus NEGATIVE NEGATIVE Final    Comment: (NOTE) The Xpert SA Assay (FDA approved for NASAL specimens in patients 62 years of age and older), is one component of a comprehensive surveillance program. It is not intended to diagnose infection nor to guide or monitor treatment. Performed at [facility], [address] [place], [place] [address]   Resp Panel by RT-PCR (Flu A&B, Covid) Nasopharyngeal Swab     Status: None   Collection Time: 01/16/21  2:10 PM   Specimen: Nasopharyngeal Swab; Nasopharyngeal(NP) swabs in vial transport medium  Result Value Ref Range Status   SARS Coronavirus 2 by RT PCR NEGATIVE NEGATIVE Final    Comment: (NOTE) SARS-CoV-2 target nucleic acids are NOT DETECTED.  The SARS-CoV-2 RNA is generally detectable in upper respiratory specimens during the acute phase of infection. The lowest concentration of SARS-CoV-2 viral copies this assay can detect is 138 copies/mL. A negative result does not preclude SARS-Cov-2 infection and should not be used as the sole basis for treatment or other patient management  decisions. A negative result may occur with  improper specimen collection/handling, submission of specimen other than nasopharyngeal swab, presence of viral mutation(s) within the areas targeted by this assay, and inadequate number of viral copies(<138 copies/mL). A negative result must be combined with clinical observations, patient history, and epidemiological information. The expected result is Negative.  Fact Sheet for Patients:  BloggerCourse.com  Fact Sheet for Healthcare Providers:  SeriousBroker.it  This test is no t yet approved or cleared by the [place] FDA and  has been authorized for detection and/or diagnosis of SARS-CoV-2 by FDA under an Emergency Use Authorization (EUA). This EUA will remain  in effect (meaning this test can be used) for the duration of the COVID-19 declaration under Section 564(b)(1) of the Act, 21 U.S.C.section 360bbb-3(b)(1), unless the authorization is terminated  or revoked sooner.       Influenza A by PCR NEGATIVE NEGATIVE Final   Influenza B by PCR NEGATIVE NEGATIVE Final    Comment: (NOTE) The Xpert Xpress SARS-CoV-2/FLU/RSV plus assay is intended as an aid in the diagnosis of influenza from Nasopharyngeal swab specimens and should not be used  as a sole basis for treatment. Nasal washings and aspirates are unacceptable for Xpert Xpress SARS-CoV-2/FLU/RSV testing.  Fact Sheet for Patients: BloggerCourse.com  Fact Sheet for Healthcare Providers: SeriousBroker.it  This test is not yet approved or cleared by the [place] FDA and has been authorized for detection and/or diagnosis of SARS-CoV-2 by FDA under an Emergency Use Authorization (EUA). This EUA will remain in effect (meaning this test can be used) for the duration of the COVID-19 declaration under Section 564(b)(1) of the Act, 21 U.S.C. section 360bbb-3(b)(1), unless the  authorization is terminated or revoked.  Performed at [facility], [address], [place], [place] [address]        Today   Subjective    Jorge Lester today has no new complaints  No fever  Or chills   No Nausea, Vomiting or Diarrhea  -- Son-in-law at bedside, questions answered           Patient has been seen and examined prior to discharge   Objective   Blood pressure 137/80, pulse 90, temperature 97.9 F (36.6 C), resp. rate 17, height  (1.778 m), weight 67.6 kg, SpO2 97 %.   Intake/Output Summary (Last 24 hours) at 01/17/2021 1230 Last data filed at 01/17/2021 0900 Gross per 24 hour  Intake 680 ml  Output 2200 ml  Net -1520 ml    Exam Gen:- Awake Alert,  in no apparent distress HEENT:- [place].AT, No sclera icterus Neck-Supple Neck,No JVD,. Lungs-  CTAB , fair symmetrical air movement CV- S1, S2 normal, irregular Abd-  +ve B.Sounds, Abd Soft, No tenderness,    Extremity/Skin:- No  edema, pedal pulses present Psych-affect is appropriate, oriented x3 Neuro-generalized weakness no new focal deficits, +ve Parkisonian tremors MSK-right hip postop wound clean dry and intact   Data Review   CBC w Diff:  Lab Results  Component Value Date   WBC 7.6 01/17/2021   HGB 9.0 (L) 01/17/2021   HCT 28.1 (L) 01/17/2021   PLT 166 01/17/2021   LYMPHOPCT 7 01/12/2021   MONOPCT 8 01/12/2021   EOSPCT 0 01/12/2021   BASOPCT 1 01/12/2021    CMP:  Lab Results  Component Value Date   NA 132 (L) 01/16/2021   K 4.3 01/16/2021   CL 101 01/16/2021   CO2 25 01/16/2021   BUN 29 (H) 01/16/2021   CREATININE 1.12 01/16/2021   PROT 6.7 01/13/2021   ALBUMIN 3.9 01/13/2021   BILITOT 1.3 (H) 01/13/2021   ALKPHOS 53 01/13/2021   AST 18 01/13/2021   ALT 5 01/13/2021  .   Total Discharge time is about 33 minutes  Shon Hale M.D on 01/17/2021 at 12:30 PM  Go to [email or web address] -  for contact info  [facility] - Office  [phone]

## 2021-01-18 ENCOUNTER — Other Ambulatory Visit: Payer: Self-pay | Admitting: Critical Care Medicine

## 2021-02-13 ENCOUNTER — Ambulatory Visit: Payer: Medicare Other | Admitting: Urology

## 2021-02-19 ENCOUNTER — Other Ambulatory Visit: Payer: Self-pay | Admitting: Neurology

## 2021-03-16 ENCOUNTER — Encounter: Payer: Self-pay | Admitting: Neurology

## 2021-03-16 ENCOUNTER — Other Ambulatory Visit: Payer: Self-pay

## 2021-03-16 ENCOUNTER — Ambulatory Visit: Payer: Medicare Other | Admitting: Neurology

## 2021-03-16 VITALS — BP 130/86 | HR 87 | Ht 70.0 in | Wt 137.0 lb

## 2021-03-16 DIAGNOSIS — G20 Parkinson's disease: Secondary | ICD-10-CM | POA: Diagnosis not present

## 2021-03-16 DIAGNOSIS — G20A1 Parkinson's disease without dyskinesia, without mention of fluctuations: Secondary | ICD-10-CM

## 2021-03-16 DIAGNOSIS — R269 Unspecified abnormalities of gait and mobility: Secondary | ICD-10-CM | POA: Diagnosis not present

## 2021-03-16 MED ORDER — CARBIDOPA-LEVODOPA 25-100 MG PO TABS: 1.0000 | ORAL_TABLET | Freq: Three times a day (TID) | ORAL | 1 refills | Status: DC

## 2021-03-16 MED ORDER — QUETIAPINE FUMARATE 25 MG PO TABS: 25.0000 mg | ORAL_TABLET | Freq: Every day | ORAL | 3 refills | Status: DC

## 2021-03-16 NOTE — Progress Notes (Signed)
Reason for visit: Parkinson's disease, gait disorder  Jorge Lester is an 87 y.o. male  History of present illness:  Jorge Lester is an 87 year old right-handed white male with a history of Parkinson's disease.  The patient has a chronic gait disorder associated with this.  He unfortunately fell and fractured his right hip on 12 January 2021.  He is back home at this point, he is getting home health physical therapy.  He is now much more careful about using his walker for ambulation.  The patient still has some residual pain in the right hip.  He has lost about 14 pounds since the hip fracture.  His daughter is now living with him at least temporarily until he gets more independent.  He is on Sinemet 25/100 mg tablets taking 1 in the morning and midday and half in the evening, he sleeps about 13 hours a day, when he gets up at night to go to the bathroom he takes a 50/200 mg CR tablet around midnight which helps his ability to function the next morning.  The patient is on Seroquel, he is not having any hallucinations at this point.  He does report some occasional choking.  Currently he is not operating a motor vehicle.  The patient is trying to exercise on a regular basis.  The patient fell again on 25 February 2021 again when he was not using his walker.  He bumped his head and his right elbow.  The patient returns for an evaluation.  Past Medical History:  Diagnosis Date   Anemia    CAD (coronary artery disease)    a. CTO of LAD known since 2008, s/p DES x 2 to CTO 01/01/13. b. known residual nonobstructive disease in LCx, RCA.   COPD (chronic obstructive pulmonary disease) (HCC)    GERD (gastroesophageal reflux disease)    Gout    Hyperlipidemia    Hypertension    Mitral regurgitation    Prev mild, most recently trivial by echo 11/2012.   Orthostatic hypotension 06/04/2017   Parkinson's disease    Sleep apnea     Past Surgical History:  Procedure Laterality Date   CARDIAC  CATHETERIZATION  2008   CORONARY ANGIOPLASTY WITH STENT PLACEMENT  01/01/2013   "2" (01/01/2013)   INTRAMEDULLARY (IM) NAIL INTERTROCHANTERIC Right 01/13/2021   Procedure: INTRAMEDULLARY (IM) NAIL INTERTROCHANTRIC;  Surgeon: Vickki Hearing, MD;  Location: [facility];  Service: Orthopedics;  Laterality: Right;  Gamma nail short 125   LUMBAR DISC SURGERY  1974   PERCUTANEOUS CORONARY STENT INTERVENTION (PCI-S) N/A 01/01/2013   Procedure: PERCUTANEOUS CORONARY STENT INTERVENTION (PCI-S);  Surgeon: Peter M Swaziland, MD;  Location: [facility] CATH LAB;  Service: Cardiovascular;  Laterality: N/A;   TONSILLECTOMY AND ADENOIDECTOMY  1947   TRIGGER FINGER RELEASE  1980's   VASECTOMY      Family History  Problem Relation Age of Onset   Ovarian cancer Mother    Coronary artery disease Father    Alzheimer's disease Father    Diabetes Brother    Diabetes Other    Transient ischemic attack Other     Social history:  reports that he quit smoking about 31 years ago. His smoking use included cigarettes. He started smoking about 75 years ago. He has a 88.00 pack-year smoking history. He has never used smokeless tobacco. He reports that he does not drink alcohol and does not use drugs.    Allergies  Allergen Reactions   Amantadines  Hallucinations   Sulfonamide Derivatives     Medications:  Prior to Admission medications   Medication Sig Start Date End Date Taking? Authorizing Provider  acetaminophen (TYLENOL) 500 MG tablet Take 1,000 mg by mouth every 6 (six) hours as needed.   Yes [provider]  albuterol (VENTOLIN HFA) 108 (90 Base) MCG/ACT inhaler Inhale 2 puffs into the lungs every 4 (four) hours as needed for wheezing or shortness of breath. 01/15/20  Yes Bevelyn Ngo, NP  aspirin EC 325 MG EC tablet Take 1 tablet (325 mg total) by mouth daily with breakfast. 01/18/21  Yes Emokpae, Courage, MD  carbidopa-levodopa (SINEMET CR) 50-200 MG tablet 1/2 tablet in the morning and midday, 1 tablet at  6 PM and Midnight 10/12/20  Yes York Spaniel, MD  carbidopa-levodopa (SINEMET IR) 25-100 MG tablet TAKE 1 TABLET BY MOUTH EVERY MORNING, AT NOON AND TAKE 1/2 TABLET BY MOUTH EVERY EVENING 08/22/20  Yes York Spaniel, MD  doxazosin (CARDURA) 2 MG tablet Take 2 mg by mouth at bedtime.     Yes [provider]  famotidine (PEPCID) 20 MG tablet Take 1 tablet (20 mg total) by mouth daily. 01/17/21  Yes Emokpae, Courage, MD  Glycopyrrolate-Formoterol (BEVESPI AEROSPHERE) 9-4.8 MCG/ACT AERO INHALE TWO PUFFS BY MOUTH EVERY MORNING and INHALE TWO PUFFS AT BEDTIME 01/18/21  Yes Bevelyn Ngo, NP  methocarbamol (ROBAXIN) 500 MG tablet Take 1 tablet (500 mg total) by mouth 2 (two) times daily. 01/17/21  Yes Emokpae, Courage, MD  metoprolol succinate (TOPROL XL) 25 MG 24 hr tablet Take 1 tablet (25 mg total) by mouth daily. 01/17/21 01/17/22 Yes Emokpae, Courage, MD  mirtazapine (REMERON) 15 MG tablet TAKE 1 TABLET BY MOUTH AT BEDTIME 12/20/20  Yes York Spaniel, MD  nitroGLYCERIN (NITROSTAT) 0.4 MG SL tablet Place 1 tablet (0.4 mg total) under the tongue every 5 (five) minutes as needed. 11/13/12  Yes Serpe, Clide Deutscher, PA-C  polyethylene glycol (MIRALAX / GLYCOLAX) 17 g packet Take 17 g by mouth daily. 01/18/21  Yes Emokpae, Courage, MD  QUEtiapine (SEROQUEL) 25 MG tablet TAKE 1 TABLET BY MOUTH AT BEDTIME 05/24/20  Yes York Spaniel, MD  ropinirole (REQUIP) 5 MG tablet TAKE 1 TABLET BY MOUTH BEFORE bedtime Patient taking differently: Take 5 mg by mouth See admin instructions. TAKE 1 TABLET BY MOUTH BEFORE BEDTIME 11/21/20  Yes York Spaniel, MD  senna-docusate (SENOKOT-S) 8.6-50 MG tablet Take 2 tablets by mouth at bedtime. 01/17/21  Yes Emokpae, Courage, MD  simvastatin (ZOCOR) 40 MG tablet Take 1 tablet by mouth daily. Changed by Dr. Margo Common 11/28/15  Yes [provider]    ROS:  Out of a complete 14 system review of symptoms, the patient complains only of the following symptoms, and all other  reviewed systems are negative.  Gait instability Weight loss Daytime drowsiness  Blood pressure 130/86, pulse 87, height 5\' 10"  (1.778 m), weight 137 lb (62.1 kg).  Physical Exam  General: The patient is alert and cooperative at the time of the examination.  Skin: No significant peripheral edema is noted.   Neurologic Exam  Mental status: The patient is alert and oriented x 3 at the time of the examination. The patient has apparent normal recent and remote memory, with an apparently normal attention span and concentration ability.   Cranial nerves: Facial symmetry is present. Speech is normal, no aphasia or dysarthria is noted. Extraocular movements are full. Visual fields are full, with the exception that  there is some restriction of superior gaze.  Masking of the face is seen.  Motor: The patient has good strength in all 4 extremities.  Sensory examination: Soft touch sensation is symmetric on the face, arms, and legs.  Coordination: The patient has good finger-nose-finger and heel-to-shin bilaterally.  No significant tremors are noted.  Gait and station: The patient is able to stand by pulling up on the walker, he takes good strides and has good turns with a walker.  Romberg is unsteady with a tendency to fall.  Tandem gait was not attempted.  The patient is stooped, he has some scoliosis with rotation of the upper body to the right.  Reflexes: Deep tendon reflexes are symmetric.   Assessment/Plan:  1.  Parkinson's disease  2.  Gait disturbance  3.  Hard of hearing  The patient is walking fairly well with a walker at this point.  We will go up on Sinemet slightly during the day, the patient denies any wearing off effect.  He does freeze up some in close quarters.  He will go to 25/100 mg Sinemet tablets, 1 tablet 3 times during the day and continue the 50/200 mg CR tablet in the evening.  A prescription was sent in for the Seroquel and for the Sinemet 25/100 mg tablets.   He also is on mirtazapine through this office.  Hopefully this will help boost his appetite.  He will follow-up in 4 months, in the future he can be followed through Dr. Frances Furbish.  Marlan Palau MD 03/16/2021 8:25 AM  [facility] [address] [place], [place] [address]  Phone [phone] Fax [phone]

## 2021-03-20 ENCOUNTER — Other Ambulatory Visit: Payer: Self-pay | Admitting: Acute Care

## 2021-05-23 ENCOUNTER — Other Ambulatory Visit: Payer: Self-pay | Admitting: Acute Care

## 2021-06-28 ENCOUNTER — Other Ambulatory Visit: Payer: Self-pay

## 2021-06-28 MED ORDER — MIRTAZAPINE 15 MG PO TABS: 15.0000 mg | ORAL_TABLET | Freq: Every day | ORAL | 1 refills | Status: DC

## 2021-07-12 ENCOUNTER — Ambulatory Visit: Payer: Medicare Other | Admitting: Cardiology

## 2021-07-12 NOTE — Progress Notes (Deleted)
Clinical Summary Mr. Dente is a 87 y.o.male seen today for follow up of the following medical problems.    1. CAD   - PCI to LAD CTO 12/2012 with DES x2   - 11/2012 echo LVEF 55-60%,   - 12/2015 nuclear stress low risk, possible mild ischemia   Medical therapy limited by soft bp's, orthostatic symptoms.         - no recent chest pain. CHronic SOB stable. No recent edema.  Medical therpay limited due to orthostatic hypotension.    2. COPD - abnormal PFTs 08/2016 - he tried spiriva but without benefit and stopped taking on his own.    - followed by pulmonary - breathing improved with inhalers     3. Parkinsons disease - followed by neuro   3.Orhthostatic hypotension - due to his parkinsons disease   - no recent symptoms. Working to stay well hydrated.    4. HL   - compliant with statin. He greatly prefers simvastatin 40mg  daily, has not wanted alternative statin.   - 11/2017 TC 134 TG 84 HDL 47 LDL 70   12/2019 TC 120 TG 64 HDL 51 LDL 55 - labs followed by pcp   7. - Hip fracture 01/2021, s/p surgery  Past Medical History:  Diagnosis Date   Anemia    CAD (coronary artery disease)    a. CTO of LAD known since 2008, s/p DES x 2 to CTO 01/01/13. b. known residual nonobstructive disease in LCx, RCA.   COPD (chronic obstructive pulmonary disease) (HCC)    GERD (gastroesophageal reflux disease)    Gout    Hyperlipidemia    Hypertension    Mitral regurgitation    Prev mild, most recently trivial by echo 11/2012.   Orthostatic hypotension 06/04/2017   Parkinson's disease    Sleep apnea      Allergies  Allergen Reactions   Amantadines     Hallucinations   Sulfonamide Derivatives      Current Outpatient Medications  Medication Sig Dispense Refill   acetaminophen (TYLENOL) 500 MG tablet Take 1,000 mg by mouth every 6 (six) hours as needed.     albuterol (VENTOLIN HFA) 108 (90 Base) MCG/ACT inhaler Inhale 2 puffs into the lungs every 4 (four) hours as needed  for wheezing or shortness of breath. 8 g 5   aspirin EC 325 MG EC tablet Take 1 tablet (325 mg total) by mouth daily with breakfast. 30 tablet 0   BEVESPI AEROSPHERE 9-4.8 MCG/ACT AERO INHALE TWO PUFFS BY MOUTH EVERY MORNING and INHALE TWO PUFFS AT BEDTIME 10.7 g 1   carbidopa-levodopa (SINEMET CR) 50-200 MG tablet 1/2 tablet in the morning and midday, 1 tablet at 6 PM and Midnight 270 tablet 3   carbidopa-levodopa (SINEMET IR) 25-100 MG tablet Take 1 tablet by mouth 3 (three) times daily. 270 tablet 1   doxazosin (CARDURA) 2 MG tablet Take 2 mg by mouth at bedtime.       famotidine (PEPCID) 20 MG tablet Take 1 tablet (20 mg total) by mouth daily. 30 tablet 1   methocarbamol (ROBAXIN) 500 MG tablet Take 1 tablet (500 mg total) by mouth 2 (two) times daily. 60 tablet 0   metoprolol succinate (TOPROL XL) 25 MG 24 hr tablet Take 1 tablet (25 mg total) by mouth daily. 30 tablet 11   mirtazapine (REMERON) 15 MG tablet Take 1 tablet (15 mg total) by mouth at bedtime. 90 tablet 1   nitroGLYCERIN (NITROSTAT) 0.4 MG  SL tablet Place 1 tablet (0.4 mg total) under the tongue every 5 (five) minutes as needed. 25 tablet 3   polyethylene glycol (MIRALAX / GLYCOLAX) 17 g packet Take 17 g by mouth daily. 30 each 0   QUEtiapine (SEROQUEL) 25 MG tablet Take 1 tablet (25 mg total) by mouth at bedtime. 90 tablet 3   ropinirole (REQUIP) 5 MG tablet TAKE 1 TABLET BY MOUTH BEFORE bedtime (Patient taking differently: Take 5 mg by mouth See admin instructions. TAKE 1 TABLET BY MOUTH BEFORE BEDTIME) 90 tablet 2   senna-docusate (SENOKOT-S) 8.6-50 MG tablet Take 2 tablets by mouth at bedtime. 60 tablet 2   simvastatin (ZOCOR) 40 MG tablet Take 1 tablet by mouth daily. Changed by Dr. Margo Common     No current facility-administered medications for this visit.     Past Surgical History:  Procedure Laterality Date   CARDIAC CATHETERIZATION  2008   CORONARY ANGIOPLASTY WITH STENT PLACEMENT  01/01/2013   "2" (01/01/2013)    INTRAMEDULLARY (IM) NAIL INTERTROCHANTERIC Right 01/13/2021   Procedure: INTRAMEDULLARY (IM) NAIL INTERTROCHANTRIC;  Surgeon: Vickki Hearing, MD;  Location: [facility];  Service: Orthopedics;  Laterality: Right;  Gamma nail short 125   LUMBAR DISC SURGERY  1974   PERCUTANEOUS CORONARY STENT INTERVENTION (PCI-S) N/A 01/01/2013   Procedure: PERCUTANEOUS CORONARY STENT INTERVENTION (PCI-S);  Surgeon: Peter M Swaziland, MD;  Location: [facility] CATH LAB;  Service: Cardiovascular;  Laterality: N/A;   TONSILLECTOMY AND ADENOIDECTOMY  1947   TRIGGER FINGER RELEASE  1980's   VASECTOMY       Allergies  Allergen Reactions   Amantadines     Hallucinations   Sulfonamide Derivatives       Family History  Problem Relation Age of Onset   Ovarian cancer Mother    Coronary artery disease Father    Alzheimer's disease Father    Diabetes Brother    Diabetes Other    Transient ischemic attack Other      Social History Mr. Boxley reports that he quit smoking about 32 years ago. His smoking use included cigarettes. He started smoking about 76 years ago. He has a 88.00 pack-year smoking history. He has never used smokeless tobacco. Mr. Howdeshell reports no history of alcohol use.   Review of Systems CONSTITUTIONAL: No weight loss, fever, chills, weakness or fatigue.  HEENT: Eyes: No visual loss, blurred vision, double vision or yellow sclerae.No hearing loss, sneezing, congestion, runny nose or sore throat.  SKIN: No rash or itching.  CARDIOVASCULAR:  RESPIRATORY: No shortness of breath, cough or sputum.  GASTROINTESTINAL: No anorexia, nausea, vomiting or diarrhea. No abdominal pain or blood.  GENITOURINARY: No burning on urination, no polyuria NEUROLOGICAL: No headache, dizziness, syncope, paralysis, ataxia, numbness or tingling in the extremities. No change in bowel or bladder control.  MUSCULOSKELETAL: No muscle, back pain, joint pain or stiffness.  LYMPHATICS: No enlarged nodes. No history of  splenectomy.  PSYCHIATRIC: No history of depression or anxiety.  ENDOCRINOLOGIC: No reports of sweating, cold or heat intolerance. No polyuria or polydipsia.  [name]   Physical Examination There were no vitals filed for this visit. There were no vitals filed for this visit.  Gen: resting comfortably, no acute distress HEENT: no scleral icterus, pupils equal round and reactive, no palptable cervical adenopathy,  CV Resp: Clear to auscultation bilaterally GI: abdomen is soft, non-tender, non-distended, normal bowel sounds, no hepatosplenomegaly MSK: extremities are warm, no edema.  Skin: warm, no rash Neuro:  no focal deficits Psych: appropriate  affect   Diagnostic Studies  11/2012 Cath   Procedural Findings:   Hemodynamics:   AO 117/52 with a mean of 79 mmHg   LV 116/14 mmHg   Coronary angiography:   Coronary dominance: right   Left mainstem: The left main coronary is normal.   Left anterior descending (LAD): The left anterior descending artery is diffusely diseased in the proximal vessel up to 75%. It is occluded in the mid vessel. There are left to left collaterals from the ramus intermediate Morgana Rowley. There are also right-to-left collaterals via septal perforators to the distal LAD. The proximal Is well-defined and there appears to be a small tract through the occlusion.   There is a ramus intermediate Emonee Winkowski which is moderate to large. It has mild irregularities less than 20%.   Left circumflex (LCx): The left circumflex gives rise to a single bifurcating marginal Dalante Minus. The proximal OM has a segmental 60% stenosis.   Right coronary artery (RCA): The right coronary is a large dominant vessel. It is diffusely diseased throughout with stenosis up to 40% in the mid vessel. There is significant plaque burden throughout without significant stenosis.   Left ventriculography: Left ventricular systolic function is normal, LVEF is estimated at 55-65%, there is no significant mitral  regurgitation   Final Conclusions:   1. Single vessel occlusive coronary disease with chronic total occlusion of the mid LAD.   2. Normal left ventricular function.   Recommendations: The patient has significant symptoms and ischemia on noninvasive testing despite 2 antianginal agents. His LAD CTO appears suitable for PCI. The alternative would be to increase his antianginal therapy. I will discuss options with the patient.     01/01/13   Lesion Data:   Vessel: LAD   Percent stenosis (pre): 100%   TIMI-flow (pre): 0   Stent: 2.5 x 38 and 2.75 x 12 mm Promus premier stents   Percent stenosis (post): 0%   TIMI-flow (post): 3   Conclusions:   Successful stenting of the mid LAD for CTO with drug-eluting stents.     11/2012 Echo   LVEF 55-60%, grade II diastolic dysfunction,     11/2015 echo Study Conclusions   - Left ventricle: The cavity size was normal. Wall thickness was   normal. Systolic function was normal. The estimated ejection   fraction was in the range of 55% to 60%. Wall motion was normal;   there were no regional wall motion abnormalities. Doppler   parameters are consistent with abnormal left ventricular   relaxation (grade 1 diastolic dysfunction). - Aortic valve: There was mild regurgitation. Valve area (VTI):   2.49 cm^2. Valve area (Vmax): 2.39 cm^2. Valve area (Vmean): 2.42   cm^2. - Mitral valve: There was mild regurgitation. - Right ventricle: The cavity size was mildly dilated. - Right atrium: The atrium was mildly dilated. - Technically adequate study.   12/2015 MPI Perfusion Summary Defect 1:  There is a small defect of mild severity present in the apical anterior location. The defect is partially reversible. Small, mild intensity, partially reversible apical anterior defect suggesting a minor region of ischemia.  Defect 2:  There is a small defect of mild severity present in the mid inferoseptal and apical inferior location. The defect is non-reversible.  Small, mild intensity, mid to apical inferior/inferoseptal defect consistent with soft tissue attenuation.      Overall Study Impression Myocardial perfusion is abnormal. This is a low risk study. Overall left ventricular systolic function was normal. Nuclear stress EF: 66%  08/2016 PFTs +COPD     01/2019 echo IMPRESSIONS      1. The left ventricle has normal systolic function, with an ejection fraction of 55-60%. The cavity size was normal. There is mildly increased left ventricular wall thickness. Left ventricular diastolic Doppler parameters are consistent with impaired relaxation. No evidence of left ventricular regional wall motion abnormalities.  2. The right ventricle has normal systolic function. The cavity was normal. There is no increase in right ventricular wall thickness. Right ventricular systolic pressure could not be assessed.  3. The aortic valve is tricuspid. Mild calcification of the aortic valve. Aortic valve regurgitation is mild by color flow Doppler. Mild aortic annular calcification noted.  4. The mitral valve is grossly normal. There is mild mitral annular calcification present.  5. The tricuspid valve is grossly normal.  6. The aortic root is normal in size and structure.      Assessment and Plan  1. CAD   - no recent symptoms - medical therapy limited by orthostatic hypotension - he will continue current meds        2. Orthostatic hypotension - secondary to Parkinsons - no symptoms, has done well with just aggressive hydration alone - continue to monitor.      3. Hyperlipidemia   - he changed back to simva 40mg  on his own, no side effects on atorva 80 but uncomfortable taking that dose. - LDL has been at goal, labs followed by pcp        Arnoldo Lenis, M.D., F.A.C.C.

## 2021-07-23 ENCOUNTER — Other Ambulatory Visit: Payer: Self-pay | Admitting: Acute Care

## 2021-07-27 ENCOUNTER — Other Ambulatory Visit: Payer: Self-pay

## 2021-07-27 ENCOUNTER — Encounter: Payer: Self-pay | Admitting: Neurology

## 2021-07-27 ENCOUNTER — Ambulatory Visit: Payer: Medicare Other | Admitting: Neurology

## 2021-07-27 VITALS — BP 138/85 | HR 91 | Ht 70.0 in | Wt 135.6 lb

## 2021-07-27 DIAGNOSIS — Z9181 History of falling: Secondary | ICD-10-CM | POA: Diagnosis not present

## 2021-07-27 DIAGNOSIS — G20 Parkinson's disease: Secondary | ICD-10-CM

## 2021-07-27 DIAGNOSIS — K5909 Other constipation: Secondary | ICD-10-CM

## 2021-07-27 DIAGNOSIS — R443 Hallucinations, unspecified: Secondary | ICD-10-CM | POA: Diagnosis not present

## 2021-07-27 NOTE — Progress Notes (Signed)
Subjective:    Patient ID: Jorge Lester is a 88 y.o. male.  HPI    Interim history:    Jorge Lester is an 88 year old gentleman with an underlying complex medical history of coronary artery disease (s/p stent placements), reflux disease, COPD, gout, hypertension, hyperlipidemia, mitral regurgitation, orthostatic hypotension, sleep apnea (per chart review, patient and daughter are not aware of any such Dx), and anemia, who presents for follow-up consultation of his parkinsonism, complicated by severe constipation, hallucinations and falls.  The patient is accompanied by [name], his daughter today.  He was previously followed by Dr. Jannifer Franklin and was last seen by Dr. Jannifer Franklin on 05/16/2021.  I reviewed the note and copied the note below for reference.  He has been on Sinemet, Sinemet CR, ropinirole 5 mg at bedtime, also takes Seroquel 25 mg nightly and mirtazapine 15 mg nightly.   Today 07/27/2021: He reports feeling fairly. He had fallen in June 2022 and broke his right hip, required surgery.  He uses a U step walker at the house and has a foldable 4 wheeled walker for outside use.  He lives alone.  He has 1 child, his daughter checks on him twice a week at least, she still works, he has 2 brothers who live close by and check on them on a regular basis.  He cooks his own breakfast.  He no longer drives since summer last year.  He had a call alert button but no longer uses it because it was attached to his home phone and he no longer has a home/landline.   He is currently on Sinemet 25-100 mg strength 1 pill 3 times daily at 8, 12, 5, Sinemet CR 50-200 mg strength 1/2 pill at 8, 1/2 pill at 12, 1 pill at 2-3 AM. He is on Seroquel, 25 mg at 5 PM.  He has also been on mirtazapine, 15 mg at 5 PM or 6 PM, ropinirole 5 mg at bedtime for restless leg symptoms.  He reports that this was reduced in the past.  Symptoms date back to over 20 years ago and started with right hand tremor.  Since the Seroquel was  added, he has had rare hallucinations, reports 1 episode around Christmas. He has severe constipation and reports a bowel movement every 6 days.  He has just started taking Dulcolax as needed. Does not drink a whole lot of water, estimates that he drinks about 2 cups of water on average.  He drinks 1 can of orange soda per day and half a cup of coffee in the morning.  He wants to continue to live by himself.  After his fall last year he had a ramp installed to get into his home, he does not have to navigate any stairs.  His daughter has offered to have him move in, she has a basement room that he could use, and it does have a walk out.   The patient's allergies, current medications, family history, past medical history, past social history, past surgical history and problem list were reviewed and updated as appropriate.   Previously:  03/16/21 (Dr. Jannifer Franklin): <<Mr. Leaper is an 88 year old right-handed white male with a history of Parkinson's disease.  The patient has a chronic gait disorder associated with this.  He unfortunately fell and fractured his right hip on 12 January 2021.  He is back home at this point, he is getting home health physical therapy.  He is now much more careful about using his walker for ambulation.  The patient still has some residual pain in the right hip.  He has lost about 14 pounds since the hip fracture.  His daughter is now living with him at least temporarily until he gets more independent.  He is on Sinemet 25/100 mg tablets taking 1 in the morning and midday and half in the evening, he sleeps about 13 hours a day, when he gets up at night to go to the bathroom he takes a 50/200 mg CR tablet around midnight which helps his ability to function the next morning.  The patient is on Seroquel, he is not having any hallucinations at this point.  He does report some occasional choking.  Currently he is not operating a motor vehicle.  The patient is trying to exercise on a regular basis.   The patient fell again on 25 February 2021 again when he was not using his walker.  He bumped his head and his right elbow.  The patient returns for an evaluation.>>  His Past Medical History Is Significant For: Past Medical History:  Diagnosis Date   Anemia    CAD (coronary artery disease)    a. CTO of LAD known since 2008, s/p DES x 2 to CTO 01/01/13. b. known residual nonobstructive disease in LCx, RCA.   COPD (chronic obstructive pulmonary disease) (HCC)    GERD (gastroesophageal reflux disease)    Gout    Hyperlipidemia    Hypertension    Mitral regurgitation    Prev mild, most recently trivial by echo 11/2012.   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[place])    Orthostatic hypotension 06/04/2017   Parkinson's disease    Sleep apnea     His Past Surgical History Is Significant For: Past Surgical History:  Procedure Laterality Date   CARDIAC CATHETERIZATION  2008   CORONARY ANGIOPLASTY WITH STENT PLACEMENT  01/01/2013   "2" (01/01/2013)   INTRAMEDULLARY (IM) NAIL INTERTROCHANTERIC Right 01/13/2021   Procedure: INTRAMEDULLARY (IM) NAIL INTERTROCHANTRIC;  Surgeon: Carole Civil, MD;  Location: [facility];  Service: Orthopedics;  Laterality: Right;  Gamma nail short 125   LUMBAR DISC SURGERY  1974   PERCUTANEOUS CORONARY STENT INTERVENTION (PCI-S) N/A 01/01/2013   Procedure: PERCUTANEOUS CORONARY STENT INTERVENTION (PCI-S);  Surgeon: Peter M Martinique, MD;  Location: [facility] CATH LAB;  Service: Cardiovascular;  Laterality: N/A;   TONSILLECTOMY AND ADENOIDECTOMY  1947   TRIGGER FINGER RELEASE  1980's   VASECTOMY      His Family History Is Significant For: Family History  Problem Relation Age of Onset   Ovarian cancer Mother    Coronary artery disease Father    Alzheimer's disease Father    Diabetes Brother    Diabetes Other    Transient ischemic attack Other    Parkinson's disease Neg Hx    Tremor Neg Hx     His Social History Is Significant For: Social History   Socioeconomic History    Marital status: Married    Spouse name: Not on file   Number of children: 1   Years of education: 12   Highest education level: Not on file  Occupational History   Occupation: Retired  Tobacco Use   Smoking status: Former    Packs/day: 2.00    Years: 44.00    Pack years: 88.00    Types: Cigarettes    Start date: 07/16/1945    Quit date: 07/16/1989    Years since quitting: 32.0   Smokeless tobacco: Never  Vaping Use   Vaping Use: Never used  Substance and Sexual Activity   Alcohol use: No    Alcohol/week: 0.0 standard drinks    Comment: 01/01/2013 "last alcohol was 1983"   Drug use: No   Sexual activity: Yes  Other Topics Concern   Not on file  Social History Narrative   10/12/20 Lives at home alone   Patient is right handed.   Patient drinks 1 cup of caffeine per day.   Social Determinants of Health   Financial Resource Strain: Not on file  Food Insecurity: Not on file  Transportation Needs: Not on file  Physical Activity: Not on file  Stress: Not on file  Social Connections: Not on file    His Allergies Are:  Allergies  Allergen Reactions   Amantadines     Hallucinations   Sulfonamide Derivatives   :   His Current Medications Are:  Outpatient Encounter Medications as of 07/27/2021  Medication Sig   acetaminophen (TYLENOL) 500 MG tablet Take 1,000 mg by mouth every 6 (six) hours as needed.   albuterol (VENTOLIN HFA) 108 (90 Base) MCG/ACT inhaler Inhale 2 puffs into the lungs every 4 (four) hours as needed for wheezing or shortness of breath.   aspirin EC 325 MG EC tablet Take 1 tablet (325 mg total) by mouth daily with breakfast.   BEVESPI AEROSPHERE 9-4.8 MCG/ACT AERO INHALE TWO PUFFS BY MOUTH EVERY MORNING and INHALE TWO PUFFS AT BEDTIME   carbidopa-levodopa (SINEMET CR) 50-200 MG tablet 1/2 tablet in the morning and midday, 1 tablet at 6 PM and Midnight   carbidopa-levodopa (SINEMET IR) 25-100 MG tablet Take 1 tablet by mouth 3 (three) times daily.   doxazosin  (CARDURA) 2 MG tablet Take 2 mg by mouth at bedtime.     methocarbamol (ROBAXIN) 500 MG tablet Take 1 tablet (500 mg total) by mouth 2 (two) times daily.   metoprolol succinate (TOPROL XL) 25 MG 24 hr tablet Take 1 tablet (25 mg total) by mouth daily.   mirtazapine (REMERON) 15 MG tablet Take 1 tablet (15 mg total) by mouth at bedtime.   nitroGLYCERIN (NITROSTAT) 0.4 MG SL tablet Place 1 tablet (0.4 mg total) under the tongue every 5 (five) minutes as needed.   polyethylene glycol (MIRALAX / GLYCOLAX) 17 g packet Take 17 g by mouth daily.   QUEtiapine (SEROQUEL) 25 MG tablet Take 1 tablet (25 mg total) by mouth at bedtime.   ropinirole (REQUIP) 5 MG tablet TAKE 1 TABLET BY MOUTH BEFORE bedtime (Patient taking differently: Take 5 mg by mouth See admin instructions. TAKE 1 TABLET BY MOUTH BEFORE BEDTIME)   senna-docusate (SENOKOT-S) 8.6-50 MG tablet Take 2 tablets by mouth at bedtime.   simvastatin (ZOCOR) 40 MG tablet Take 1 tablet by mouth daily. Changed by Dr. Scotty Court   famotidine (PEPCID) 20 MG tablet Take 1 tablet (20 mg total) by mouth daily. (Patient not taking: Reported on 07/27/2021)   No facility-administered encounter medications on file as of 07/27/2021.  :  Review of Systems:  Out of a complete 14 point review of systems, all are reviewed and negative with the exception of these symptoms as listed below:  Review of Systems  Neurological:        Pt is here for follow visit with parkinson's and gait . Pt walks with a walker and he does shuffle his feet when he walks. Pt states he freezes up and takes him a minute to start to walk . Daughter states pt does have a lot of  mouth tremors  Objective:  Neurological Exam  Physical Exam Physical Examination:   Vitals:   07/27/21 1041  BP: 138/85  Pulse: 91    General Examination: The patient is a very pleasant 88 y.o. male in no acute distress. He appears thin and frail, well groomed.   HEENT: Normocephalic, atraumatic, pupils are  slightly unequal, left pupil a little larger than right, both reactive to light, status post cataract repairs.  Hearing is impaired, no hearing aids in place.  Face is symmetric with moderate facial masking and moderate nuchal rigidity is noted, no orofacial dyskinesias but has a significant lower jaw tremor.  Moderate hypophonia, moderate dysarthria.  No carotid bruits.  Edentulous on the bottom, dentures on top, tongue protrudes centrally and palate elevates symmetrically, moderate mouth dryness noted, no obvious sialorrhea.  Chest: Clear to auscultation without wheezing, rhonchi or crackles noted.  Heart: S1+S2+0, regular and normal without murmurs, rubs or gallops noted.   Abdomen: Soft, non-tender and non-distended with normal bowel sounds appreciated on auscultation.  Extremities: There is no pitting edema in the distal lower extremities bilaterally.   Skin: Warm and dry without trophic changes noted.   Musculoskeletal: exam reveals significant joint deformities, left more than right, unable to extend digit 5, L>R.   Neurologically:  Mental status: The patient is awake, alert and oriented in all 4 spheres. His immediate and remote memory, attention, language skills and fund of knowledge are mildly impaired.  Unable to provide chronological details of his history.  His details are provided by his daughter.   Thought process is linear. Mood is normal and affect is normal.  Cranial nerves II - XII are as described above under HEENT exam.  Motor exam: thin bulk, global strength of 4 out of 5, increase in tone in both upper and lower extremities without telltale cogwheeling.  Intermittent resting tremor in the right more than left lower extremities as well as slight tremor in both upper extremities.  Fine motor skills are moderately impaired throughout, slight lateralization to the right. Romberg is not testable due to safety concerns.   Cerebellar testing: No dysmetria or intention tremor. There  is no truncal or gait ataxia.  Sensory exam: intact to light touch in the upper and lower extremities.  Gait, station and balance: He stands with difficulty, requires no significant assistance, posture is moderately stooped.  He has start hesitation and stutter steps, mild freezing, instability with turns, balance is impaired, he uses a 4 wheeled rolling walker.    Assessment and Plan:   In summary, Jorge Lester is a very pleasant 88 y.o.-year old male with an underlying complex medical history of coronary artery disease (s/p stent placements), reflux disease, COPD, gout, hypertension, hyperlipidemia, mitral regurgitation, orthostatic hypotension, sleep apnea (per chart review, patient and daughter are not aware of any such Dx), and anemia, who presents for follow-up consultation of his parkinsonism, complicated by severe constipation, hallucinations and falls. I had a long discussion with the patient and his daughter today.  I do not recommend that he live by himself any longer.  They are encouraged to get a life alert or similar call alert button again.  I agree that he should not be driving any longer.  I suggested he changed the Sinemet dose times to 7 AM, 11 AM and 4 PM daily to take it approximately 1 hour before he eats.  I explained to him that taking this medication with food may interfere with the absorption.  He is advised to take  the Sinemet CR half a pill at 7 AM, half a pill at 11 AM and he can continue with 1 pill in the middle of the night.  He is advised to take the ropinirole 5 mg at 6 PM.  He is advised to taper off mirtazapine as he may not be safe to take 2 sedating medications at night.  He is advised to reduce the mirtazapine to half a pill each night for 1 week, then half a pill every other night for 1 week then stop.  He can continue with Seroquel 25 mg at 6 PM nightly.  He is advised that this is a high risk medication and may be associated with a higher risk of complications  including death in the elderly.  His daughter is advised of this caution as well.  He has done well on low-dose Seroquel and we can maintain for now.  He is advised to use his walker at all times and be more proactive about constipation issues.  He will need to use a stool softener or Dulcolax on a regular basis.  Going 6 days in between bowel movements is just too long.  He is advised to hydrate better with water and encouraged to increase to 4 to 6 glasses of water per day for now.   Would limit.  No return for any protein follow-up in about 3 to 4 months, sooner if needed.  I answered all their questions today and the patient and his daughter were in agreement.   I spent 40 minutes in total face-to-face time and in reviewing records during pre-charting, more than 50% of which was spent in counseling and coordination of care, reviewing test results, reviewing medications and treatment regimen and/or in discussing or reviewing the diagnosis of PD, the prognosis and treatment options. Pertinent laboratory and imaging test results that were available during this visit with the patient were reviewed by me and considered in my medical decision making (see chart for details).

## 2021-07-27 NOTE — Patient Instructions (Addendum)
It was nice to meet you both today.   Here is what we discussed today:  Please continue to use your walker at all times. You are at high fall risk.  I do not recommend you take 2 medications to help you sleep at night.  Therefore, as discussed, we will taper you off the mirtazapine (Remeron 15 mg strength), by reducing it to half a pill nightly for 1 week, then half a pill every other night for 1 week, then stop.  I do not believe you are fully safe to live by yourself any longer.  Please discuss with your family your long-term living situation.   Please look into getting a call alert button, like Life Alert.   Continue with Sinemet immediate release 25-100 mg strength 1 pill 3 times a day, I recommend you take it at 7 AM, 11 AM and 4 PM daily.  You could take your Sinemet long-acting 50-200 mg strength half a pill at 7 AM, half a pill at 11 AM and 1 pill in the middle of the night when you wake up.    You can continue with ropinirole 5 mg at 6 PM daily.   Please try to hydrate better with water and drink 4 to 6 cups of water per day.  Please be more proactive about constipation issues.  Add Dulcolax as needed, even daily if need be.

## 2021-08-23 ENCOUNTER — Other Ambulatory Visit: Payer: Self-pay

## 2021-08-23 MED ORDER — CARBIDOPA-LEVODOPA 25-100 MG PO TABS: 1.0000 | ORAL_TABLET | Freq: Three times a day (TID) | ORAL | 1 refills | Status: DC

## 2021-09-19 ENCOUNTER — Other Ambulatory Visit: Payer: Self-pay | Admitting: Neurology

## 2021-10-29 IMAGING — DX DG CHEST 1V PORT
2 series · 2 of 2 positions shown · non-contrast
Comparison: 01/08/2019

CLINICAL DATA: Preoperative evaluation for upcoming hip surgery

EXAM:
PORTABLE CHEST 1 VIEW

[chest ap (1 of 2)]
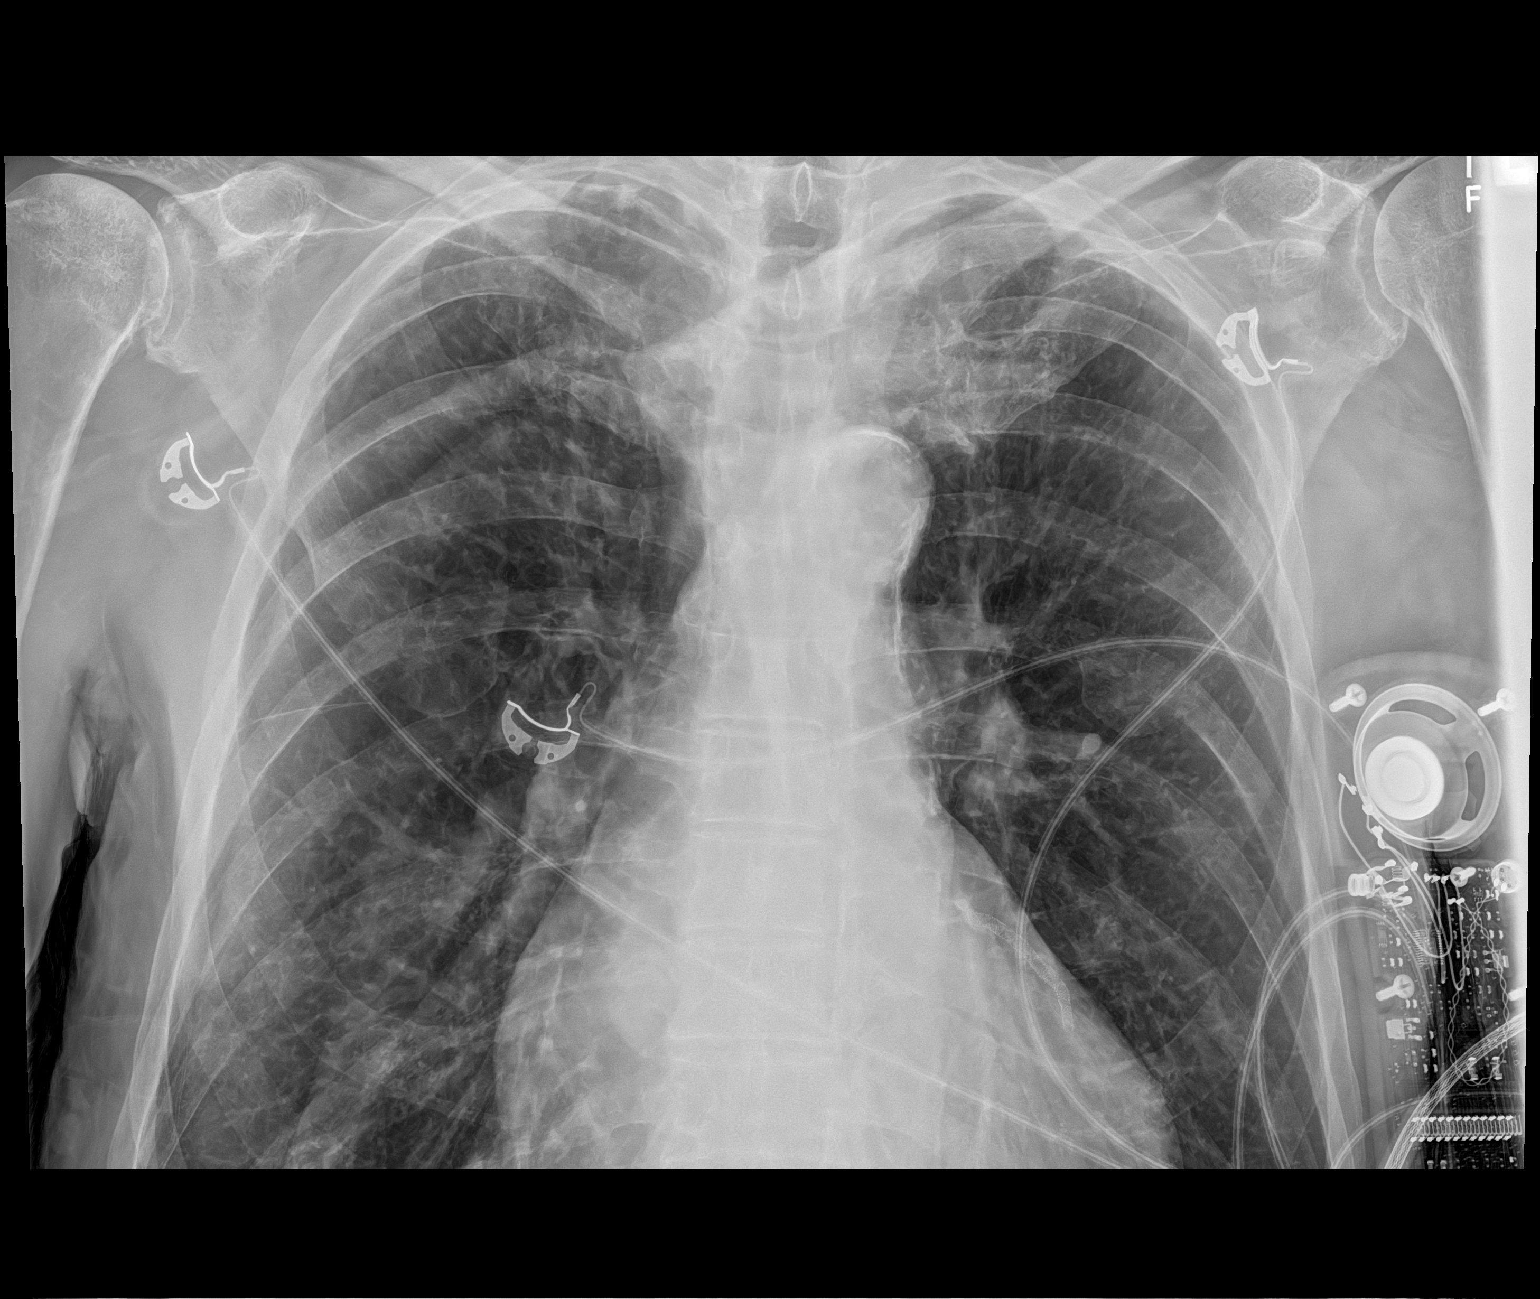

[chest ap (2 of 2)]
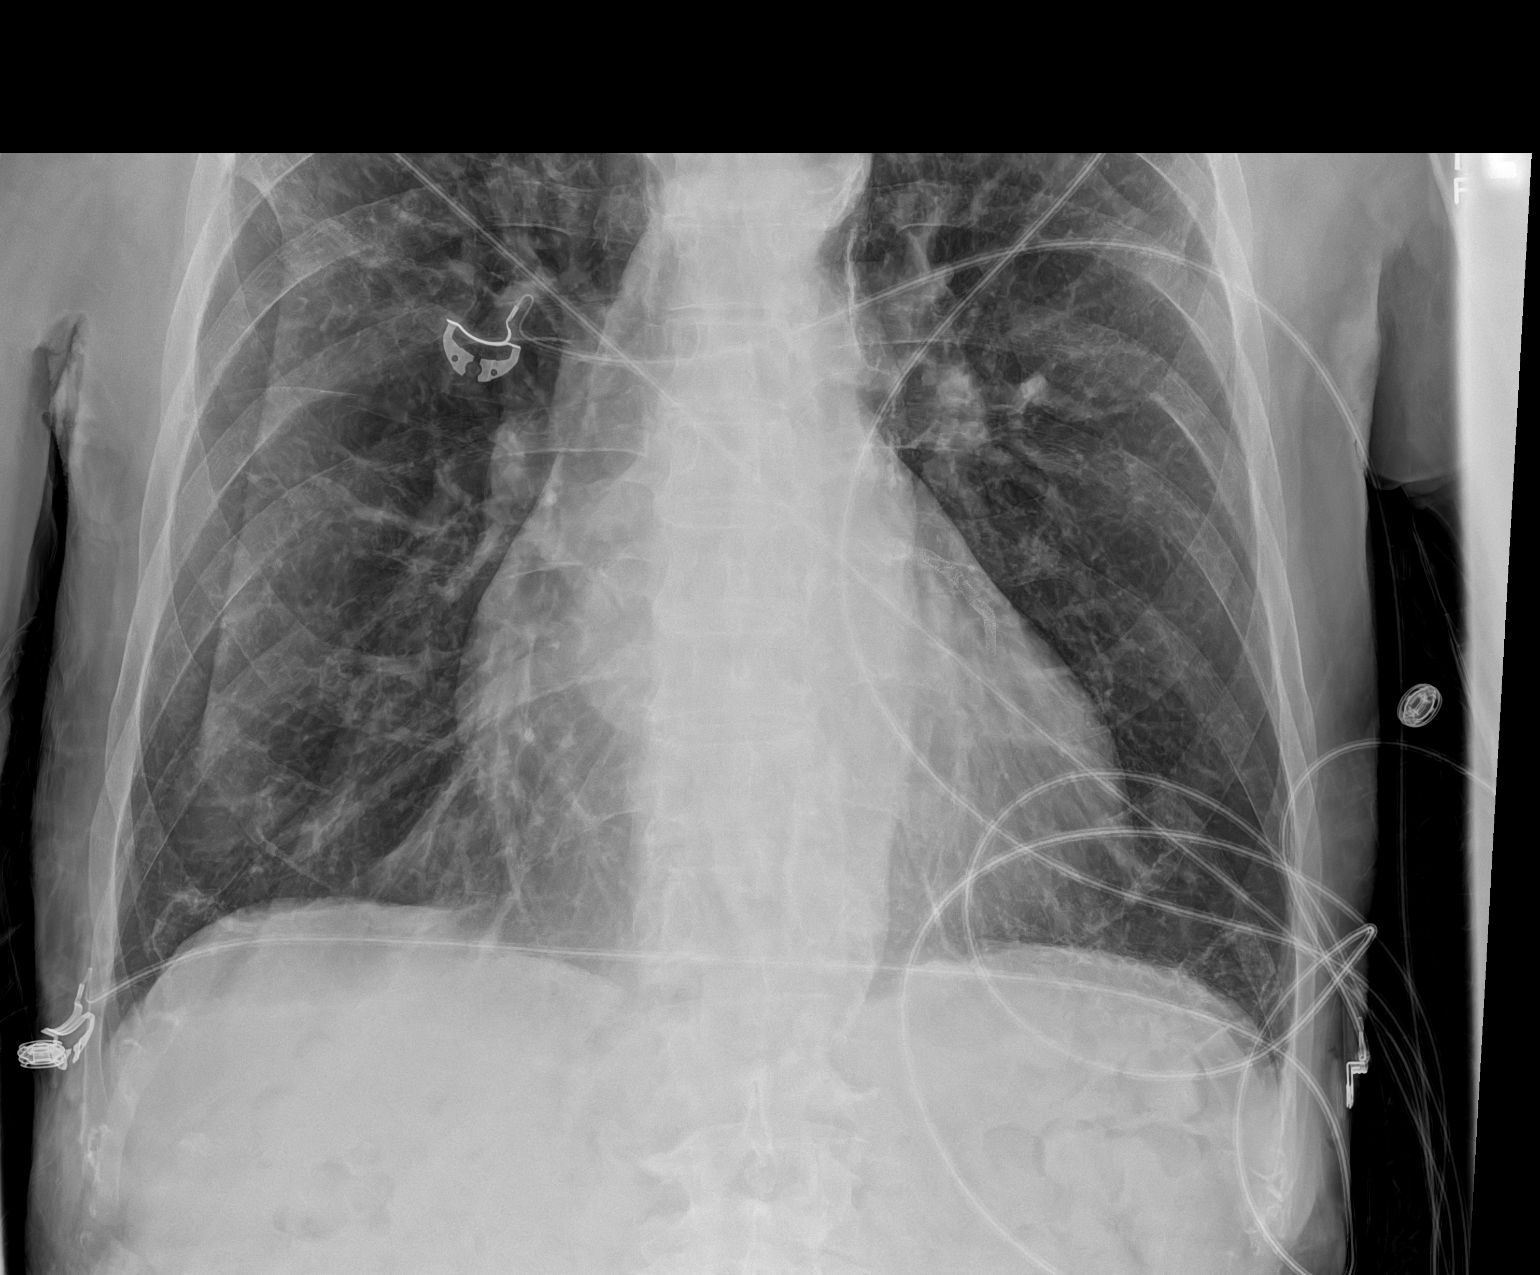

[2 of 2 positions shown; findings below may reference images not displayed]

FINDINGS: Heart is mildly enlarged in size. Aortic calcifications are again
seen and stable. The lungs are hyperinflated without focal
infiltrate or sizable effusion. Mild apical scarring is noted. No
bony abnormality is seen.
IMPRESSION: COPD without acute abnormality.

## 2021-11-01 ENCOUNTER — Ambulatory Visit: Payer: Medicare Other | Admitting: Neurology

## 2021-11-19 ENCOUNTER — Other Ambulatory Visit: Payer: Self-pay | Admitting: Neurology

## 2022-01-04 ENCOUNTER — Telehealth: Payer: Self-pay | Admitting: Neurology

## 2022-01-04 NOTE — Telephone Encounter (Signed)
Rescheduled 6/28 appt with pt's daughter over the phone- MD out.

## 2022-01-09 ENCOUNTER — Encounter: Payer: Self-pay | Admitting: Neurology

## 2022-01-09 ENCOUNTER — Ambulatory Visit: Payer: Medicare Other | Admitting: Neurology

## 2022-01-09 VITALS — BP 151/91 | HR 103 | Ht 64.0 in | Wt 127.6 lb

## 2022-01-09 DIAGNOSIS — G20 Parkinson's disease: Secondary | ICD-10-CM

## 2022-01-09 DIAGNOSIS — Z9181 History of falling: Secondary | ICD-10-CM | POA: Diagnosis not present

## 2022-01-09 DIAGNOSIS — K5909 Other constipation: Secondary | ICD-10-CM | POA: Diagnosis not present

## 2022-01-09 MED ORDER — ROPINIROLE HCL 5 MG PO TABS: 5.0000 mg | ORAL_TABLET | ORAL | 1 refills | Status: DC

## 2022-01-10 ENCOUNTER — Ambulatory Visit: Payer: Medicare Other | Admitting: Neurology

## 2022-02-14 ENCOUNTER — Other Ambulatory Visit: Payer: Self-pay | Admitting: Acute Care

## 2022-02-14 ENCOUNTER — Other Ambulatory Visit: Payer: Self-pay | Admitting: Neurology

## 2022-02-26 ENCOUNTER — Other Ambulatory Visit: Payer: Self-pay | Admitting: Acute Care

## 2022-03-26 ENCOUNTER — Other Ambulatory Visit: Payer: Self-pay | Admitting: *Deleted

## 2022-03-26 MED ORDER — QUETIAPINE FUMARATE 25 MG PO TABS: 25.0000 mg | ORAL_TABLET | Freq: Every day | ORAL | 0 refills | Status: DC

## 2022-06-18 ENCOUNTER — Other Ambulatory Visit: Payer: Self-pay | Admitting: Neurology

## 2022-07-24 NOTE — Progress Notes (Unsigned)
Patient: Jorge Lester Date of Birth: Nov 14, 1933  Reason for Visit: Follow up History from: Patient, daughter  Primary Neurologist: Rexene Alberts   ASSESSMENT AND PLAN 89 y.o. year old male   53.  Parkinson's disease 2.  Hallucinations 3.  Gait abnormality  -On exam, noted freezing, stutter steps, moderate start hesitation, daughter supports this findings, will try to increase his Sinemet 50/200 CR to 1 tablet at 7 AM, Keep 1/2 tablet 11 AM, keep 1 tablet during the night -Continue Sinemet 25/100 mg 3 times daily -Continue ropinirole 5 mg at bedtime -Continue Seroquel 25 mg at bedtime, before Seroquel had significant hallucinations -Ensure plenty of water intake, use Colace for constipation, monitor for choking or trouble swallowing -Continue to closely evaluate safety of living alone -Follow-up in 6 months or sooner if needed with Dr. Rexene Alberts  HISTORY OF PRESENT ILLNESS: Today 07/25/22 Here today for follow-up with his daughter, Jorge Lester. He still lives alone, someone bring lunch or check on his daily. Either his brothers or daughter. Using rolling walker, no falls. Does his own ADLs. He has a shower seat. He does nap often. Does the laundry, he cooks breakfast. He eats good, sleeps well. Has to be careful with swallowing, some more trouble taking his pills. Takes Sinemet CR 50/200 1/2 tablet 7 AM, 11 AM, 1 tablet at night when waking up (doesn't take it unless he wakes up). Sinemet 25/100 mg 1 three times daily. No major changes. He may feel stuck when going through open door space. Other times "speeding" around. Would like to continue Seroquel, had bad hallucinations before.  Constipation is better with Colace, has  to be mindful, sometimes causes diarrhea.  HISTORY  Dr. Rexene Alberts 01/09/22: Jorge Lester is an 89 year old gentleman with an underlying complex medical history of coronary artery disease (s/p stent placements), reflux disease, COPD, gout, hypertension, hyperlipidemia, mitral  regurgitation, orthostatic hypotension, sleep apnea (per chart review, patient and daughter are not aware of any such Dx), and anemia, who presents for follow-up consultation of his parkinsonism, complicated by severe constipation, hallucinations and falls.  The patient is accompanied by Jorge Lester, his daughter today.  I first met him on 07/27/2021, at which time he was advised to use his walker at all times because of fall risk.  He was advised to discuss long-term living situation with his family, I felt that he was not safe to live by himself any longer.  He was on Sinemet immediate release 25-100 mg strength 1 pill 3 times daily.  He was advised to take it around 7, 11 and 4.  He was taking Sinemet CR half a pill 3 times a day and was advised to continue with it.  He was on Seroquel and also mirtazapine and was advised to taper off the mirtazapine as he was also on Seroquel.  He was also on ropinirole 5 mg at bedtime.  He was advised to continue with the ropinirole for now. We also talked about the importance of constipation control and he was advised to drink more water and use Dulcolax.    01/09/2022: He reports feeling stable, constipation is better since he is taking Dulcolax daily.  He has a bowel movement every 2 to 3 days which is better than before.  He gets his lunch typically deliver from one of his brothers, he has 2 brothers that check on him and his daughter sees him about twice a week.  He has not fallen recently.  He uses his indoor and outdoor walker  fairly consistently.  On the weekend he developed a frozen lunch.  He feels that the Parkinson's medication works reasonably well for him, better since he has taken it on a schedule, away from his mealtimes.  He takes IR at 7 AM, 11 AM, and 4 PM daily.  He takes his Sinemet CR, half a pill at 7 AM, 11 AM and in the middle of the night when needed.  He takes ropinirole at night/evening.  He has been off the mirtazapine.   Of note, he had fallen in  June 2022 and broke his right hip, requiring surgery.    After his fall last year he had a ramp installed to get into his home, he does not have to navigate any stairs.   REVIEW OF SYSTEMS: Out of a complete 14 system review of symptoms, the patient complains only of the following symptoms, and all other reviewed systems are negative.  See HPI  ALLERGIES: Allergies  Allergen Reactions   Amantadines     Hallucinations   Sulfonamide Derivatives     HOME MEDICATIONS: Outpatient Medications Prior to Visit  Medication Sig Dispense Refill   acetaminophen (TYLENOL) 500 MG tablet Take 1,000 mg by mouth every 6 (six) hours as needed.     albuterol (VENTOLIN HFA) 108 (90 Base) MCG/ACT inhaler Inhale 2 puffs into the lungs every 4 (four) hours as needed for wheezing or shortness of breath. 8 g 5   aspirin EC 325 MG EC tablet Take 1 tablet (325 mg total) by mouth daily with breakfast. 30 tablet 0   carbidopa-levodopa (SINEMET CR) 50-200 MG tablet TAKE 1/2 TABLET BY MOUTH AT 7am AND 11am, AND ONE tablet in THE middle of THE night when you wake UP. 180 tablet 2   carbidopa-levodopa (SINEMET IR) 25-100 MG tablet TAKE 1 TABLET BY MOUTH THREE TIMES DAILY 270 tablet 1   doxazosin (CARDURA) 2 MG tablet Take 2 mg by mouth at bedtime.       famotidine (PEPCID) 20 MG tablet Take 1 tablet (20 mg total) by mouth daily. 30 tablet 1   Glycopyrrolate-Formoterol (BEVESPI AEROSPHERE) 9-4.8 MCG/ACT AERO INHALE TWO PUFFS BY MOUTH EVERY MORNING and INHALE TWO PUFFS AT BEDTIME 10.7 g 6   methocarbamol (ROBAXIN) 500 MG tablet Take 1 tablet (500 mg total) by mouth 2 (two) times daily. 60 tablet 0   nitroGLYCERIN (NITROSTAT) 0.4 MG SL tablet Place 1 tablet (0.4 mg total) under the tongue every 5 (five) minutes as needed. 25 tablet 3   polyethylene glycol (MIRALAX / GLYCOLAX) 17 g packet Take 17 g by mouth daily. 30 each 0   QUEtiapine (SEROQUEL) 25 MG tablet TAKE 1 TABLET BY MOUTH AT BEDTIME 90 tablet 0   ropinirole  (REQUIP) 5 MG tablet Take 1 tablet (5 mg total) by mouth See admin instructions. TAKE 1 TABLET BY MOUTH BEFORE BEDTIME 90 tablet 1   senna-docusate (SENOKOT-S) 8.6-50 MG tablet Take 2 tablets by mouth at bedtime. 60 tablet 2   simvastatin (ZOCOR) 40 MG tablet Take 1 tablet by mouth daily. Changed by Dr. Margo Common     metoprolol succinate (TOPROL XL) 25 MG 24 hr tablet Take 1 tablet (25 mg total) by mouth daily. 30 tablet 11   No facility-administered medications prior to visit.    PAST MEDICAL HISTORY: Past Medical History:  Diagnosis Date   Anemia    CAD (coronary artery disease)    a. CTO of LAD known since 2008, s/p DES x 2 to CTO 01/01/13.  b. known residual nonobstructive disease in LCx, RCA.   COPD (chronic obstructive pulmonary disease) (HCC)    GERD (gastroesophageal reflux disease)    Gout    Hyperlipidemia    Hypertension    Mitral regurgitation    Prev mild, most recently trivial by echo 11/2012.   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[place])    Orthostatic hypotension 06/04/2017   Parkinson's disease    Sleep apnea     PAST SURGICAL HISTORY: Past Surgical History:  Procedure Laterality Date   CARDIAC CATHETERIZATION  2008   CORONARY ANGIOPLASTY WITH STENT PLACEMENT  01/01/2013   "2" (01/01/2013)   INTRAMEDULLARY (IM) NAIL INTERTROCHANTERIC Right 01/13/2021   Procedure: INTRAMEDULLARY (IM) NAIL INTERTROCHANTRIC;  Surgeon: Carole Civil, MD;  Location: [facility];  Service: Orthopedics;  Laterality: Right;  Gamma nail short 125   LUMBAR DISC SURGERY  1974   PERCUTANEOUS CORONARY STENT INTERVENTION (PCI-S) N/A 01/01/2013   Procedure: PERCUTANEOUS CORONARY STENT INTERVENTION (PCI-S);  Surgeon: Peter M Martinique, MD;  Location: [facility] CATH LAB;  Service: Cardiovascular;  Laterality: N/A;   TONSILLECTOMY AND ADENOIDECTOMY  1947   TRIGGER FINGER RELEASE  1980's   VASECTOMY      FAMILY HISTORY: Family History  Problem Relation Age of Onset   Ovarian cancer Mother    Coronary artery disease Father     Alzheimer's disease Father    Diabetes Brother    Diabetes Other    Transient ischemic attack Other    Parkinson's disease Neg Hx    Tremor Neg Hx     SOCIAL HISTORY: Social History   Socioeconomic History   Marital status: Married    Spouse name: Not on file   Number of children: 1   Years of education: 12   Highest education level: Not on file  Occupational History   Occupation: Retired  Tobacco Use   Smoking status: Former    Packs/day: 2.00    Years: 44.00    Total pack years: 88.00    Types: Cigarettes    Start date: 07/16/1945    Quit date: 07/16/1989    Years since quitting: 33.0   Smokeless tobacco: Never  Vaping Use   Vaping Use: Never used  Substance and Sexual Activity   Alcohol use: No    Alcohol/week: 0.0 standard drinks of alcohol    Comment: 01/01/2013 "last alcohol was 1983"   Drug use: No   Sexual activity: Yes  Other Topics Concern   Not on file  Social History Narrative   10/12/20 Lives at home alone   Patient is right handed.   Patient drinks 1 cup of caffeine per day.   Social Determinants of Health   Financial Resource Strain: Not on file  Food Insecurity: Not on file  Transportation Needs: Not on file  Physical Activity: Not on file  Stress: Not on file  Social Connections: Not on file  Intimate Partner Violence: Not on file    PHYSICAL EXAM  Vitals:   07/25/22 0935  BP: (!) 152/96  Pulse: 92  Weight: 127 lb (57.6 kg)  Height: 5\' 10"  (1.778 m)   Body mass index is 18.22 kg/m.  Generalized: Well developed, in no acute distress, elderly male Neurological examination  Mentation: Alert oriented to time, place, history is provided by he and his daughter. Follows all commands speech and language fluent.  Hard of hearing.  Mild hypophonia.  No obvious drooling was seen.  Jaw tremor was noted. Cranial nerve II-XII: Pupils were equal round reactive to  light. Extraocular movements were full, visual field were full on confrontational  test. Facial sensation and strength were normal.  Head turning and shoulder shrug  were normal and symmetric. Motor: Good strength all extremities.  Intermittent resting tremor noted to the right lower extremity, mild bilateral upper extremities.  More bradykinesia with the right upper and lower extremities. Sensory: Sensory testing is intact to soft touch on all 4 extremities. No evidence of extinction is noted.  Coordination: Cerebellar testing reveals good finger-nose-finger and heel-to-shin bilaterally.  Gait and station: Has to push off to stand, requires no assistance, he is moderately stooped, he has moderate start hesitation and stutter steps, moderate freezing, unstable with turns, uses a rolling four-wheel walker, in the hallway we had to ride him back to the room (# 15 to infusion bathroom)  DIAGNOSTIC DATA (LABS, IMAGING, TESTING) - I reviewed patient records, labs, notes, testing and imaging myself where available.  Lab Results  Component Value Date   WBC 7.6 01/17/2021   HGB 9.0 (L) 01/17/2021   HCT 28.1 (L) 01/17/2021   MCV 95.9 01/17/2021   PLT 166 01/17/2021      Component Value Date/Time   NA 132 (L) 01/16/2021 0550   K 4.3 01/16/2021 0550   CL 101 01/16/2021 0550   CO2 25 01/16/2021 0550   GLUCOSE 88 01/16/2021 0550   BUN 29 (H) 01/16/2021 0550   CREATININE 1.12 01/16/2021 0550   CALCIUM 7.6 (L) 01/16/2021 0550   PROT 6.7 01/13/2021 0354   ALBUMIN 3.9 01/13/2021 0354   AST 18 01/13/2021 0354   ALT 5 01/13/2021 0354   ALKPHOS 53 01/13/2021 0354   BILITOT 1.3 (H) 01/13/2021 0354   GFRNONAA >60 01/16/2021 0550   GFRAA 75 (L) 01/02/2013 0523   No results found for: "CHOL", "HDL", "LDLCALC", "LDLDIRECT", "TRIG", "CHOLHDL" No results found for: "HGBA1C" No results found for: "VITAMINB12" No results found for: "TSH"  Butler Denmark, AGNP-C, DNP 07/25/2022, 9:59 AM [facility] [address], [place], [place] [address] [phone]  I  reviewed the above note and documentation by the Nurse Practitioner and agree with the history, exam, assessment and plan as outlined above. I was available for consultation. Star Age, MD, PhD [facility])

## 2022-07-25 ENCOUNTER — Encounter: Payer: Self-pay | Admitting: Neurology

## 2022-07-25 ENCOUNTER — Ambulatory Visit: Payer: Medicare Other | Admitting: Neurology

## 2022-07-25 VITALS — BP 152/96 | HR 92 | Ht 70.0 in | Wt 127.0 lb

## 2022-07-25 DIAGNOSIS — G20A2 Parkinson's disease without dyskinesia, with fluctuations: Secondary | ICD-10-CM | POA: Diagnosis not present

## 2022-07-25 DIAGNOSIS — R269 Unspecified abnormalities of gait and mobility: Secondary | ICD-10-CM

## 2022-07-25 DIAGNOSIS — R441 Visual hallucinations: Secondary | ICD-10-CM

## 2022-07-25 MED ORDER — QUETIAPINE FUMARATE 25 MG PO TABS: 25.0000 mg | ORAL_TABLET | Freq: Every day | ORAL | 1 refills | Status: DC

## 2022-07-25 MED ORDER — CARBIDOPA-LEVODOPA 25-100 MG PO TABS: 1.0000 | ORAL_TABLET | Freq: Three times a day (TID) | ORAL | 1 refills | Status: DC

## 2022-07-25 MED ORDER — CARBIDOPA-LEVODOPA ER 50-200 MG PO TBCR: EXTENDED_RELEASE_TABLET | ORAL | 2 refills | Status: DC

## 2022-07-25 MED ORDER — ROPINIROLE HCL 5 MG PO TABS: 5.0000 mg | ORAL_TABLET | ORAL | 1 refills | Status: DC

## 2022-07-25 NOTE — Patient Instructions (Signed)
Increase Sinemet CR 50/200 mg to 1 tablet in the morning, keep the 1/2 tablet at 11 AM, 1 tablet during the night Rest of medications are the same Drink plenty of water   Meds ordered this encounter  Medications   carbidopa-levodopa (SINEMET CR) 50-200 MG tablet    Sig: Take 1 tablet at 7 AM, 1/2 tablet at 11 am, and 1 tablet in the middle of the night when you wake up    Dispense:  270 tablet    Refill:  2   ropinirole (REQUIP) 5 MG tablet    Sig: Take 1 tablet (5 mg total) by mouth See admin instructions. TAKE 1 TABLET BY MOUTH BEFORE BEDTIME    Dispense:  90 tablet    Refill:  1    This prescription was filled on 11/01/2020. Any refills authorized will be placed on file.   QUEtiapine (SEROQUEL) 25 MG tablet    Sig: Take 1 tablet (25 mg total) by mouth at bedtime.    Dispense:  90 tablet    Refill:  1   carbidopa-levodopa (SINEMET IR) 25-100 MG tablet    Sig: Take 1 tablet by mouth 3 (three) times daily.    Dispense:  270 tablet    Refill:  1    This prescription was filled on 01/25/2022. Any refills authorized will be placed on file.

## 2022-08-07 DIAGNOSIS — N1832 Chronic kidney disease, stage 3b: Secondary | ICD-10-CM | POA: Diagnosis not present

## 2022-08-07 DIAGNOSIS — Z0000 Encounter for general adult medical examination without abnormal findings: Secondary | ICD-10-CM | POA: Diagnosis not present

## 2022-09-05 ENCOUNTER — Encounter: Payer: Self-pay | Admitting: Cardiology

## 2022-09-05 ENCOUNTER — Ambulatory Visit: Payer: Medicare Other | Attending: Cardiology | Admitting: Cardiology

## 2022-09-05 VITALS — BP 140/72 | HR 101 | Ht 70.0 in | Wt 128.8 lb

## 2022-09-05 DIAGNOSIS — E782 Mixed hyperlipidemia: Secondary | ICD-10-CM | POA: Diagnosis not present

## 2022-09-05 DIAGNOSIS — I4811 Longstanding persistent atrial fibrillation: Secondary | ICD-10-CM | POA: Diagnosis not present

## 2022-09-05 DIAGNOSIS — I951 Orthostatic hypotension: Secondary | ICD-10-CM | POA: Diagnosis not present

## 2022-09-05 DIAGNOSIS — I2510 Atherosclerotic heart disease of native coronary artery without angina pectoris: Secondary | ICD-10-CM | POA: Diagnosis not present

## 2022-09-05 NOTE — Progress Notes (Signed)
Clinical Summary Jorge Lester is a 89 y.o.male seen today for follow up of the following medical problems.    1. CAD   - PCI to LAD CTO 12/2012 with DES x2   - 11/2012 echo LVEF 55-60%,   - 12/2015 nuclear stress low risk, possible mild ischemia   Medical therapy limited by soft bp's, orthostatic symptoms.    - isolated chest pain 2-3 weeks ago, resolved - compliant with meds   2. COPD - abnormal PFTs 08/2016 - he tried spiriva but without benefit and stopped taking on his own.    - followed by pulmonary      3. Parkinsons disease - followed by neuro   3.Orhthostatic hypotension - due to his parkinsons disease -some dizziness, mild with standing. Reports not great oral hydration   4. HL   - compliant with statin. He greatly prefers simvastatin 72m daily, has not wanted alternative statin.    Jan 2023 TC 119 TG 73 HDL 48 LDL 56    5. Afib - noted during 01/2021 admission with hip fracture - this is first visit with cards since this diagnosis - family turned down anticoag during admission due to recent frequent falls. Reports falls have since resolved - infrequent palpitations.  - was dsicharged on toprol, no longer on list Im not sure what happened to it   Past Medical History:  Diagnosis Date   Anemia    CAD (coronary artery disease)    a. CTO of LAD known since 2008, s/p DES x 2 to CTO 01/01/13. b. known residual nonobstructive disease in LCx, RCA.   COPD (chronic obstructive pulmonary disease) (HCC)    GERD (gastroesophageal reflux disease)    Gout    Hyperlipidemia    Hypertension    Mitral regurgitation    Prev mild, most recently trivial by echo 11/2012.   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HCC)    Orthostatic hypotension 06/04/2017   Parkinson's disease    Sleep apnea      Allergies  Allergen Reactions   Amantadines     Hallucinations   Sulfonamide Derivatives      Current Outpatient Medications  Medication Sig Dispense Refill   acetaminophen  (TYLENOL) 500 MG tablet Take 1,000 mg by mouth every 6 (six) hours as needed.     albuterol (VENTOLIN HFA) 108 (90 Base) MCG/ACT inhaler Inhale 2 puffs into the lungs every 4 (four) hours as needed for wheezing or shortness of breath. 8 g 5   aspirin EC 81 MG tablet Take 1 tablet by mouth daily.     carbidopa-levodopa (SINEMET CR) 50-200 MG tablet Take 1 tablet at 7 AM, 1/2 tablet at 11 am, and 1 tablet in the middle of the night when you wake up 270 tablet 2   carbidopa-levodopa (SINEMET IR) 25-100 MG tablet Take 1 tablet by mouth 3 (three) times daily. 270 tablet 1   Glycopyrrolate-Formoterol (BEVESPI AEROSPHERE) 9-4.8 MCG/ACT AERO INHALE TWO PUFFS BY MOUTH EVERY MORNING and INHALE TWO PUFFS AT BEDTIME 10.7 g 6   nitroGLYCERIN (NITROSTAT) 0.4 MG SL tablet Place 1 tablet (0.4 mg total) under the tongue every 5 (five) minutes as needed. 25 tablet 3   QUEtiapine (SEROQUEL) 25 MG tablet Take 1 tablet (25 mg total) by mouth at bedtime. 90 tablet 1   ropinirole (REQUIP) 5 MG tablet Take 1 tablet (5 mg total) by mouth See admin instructions. TAKE 1 TABLET BY MOUTH BEFORE BEDTIME 90 tablet 1   sennosides-docusate sodium (SENOKOT-S)  8.6-50 MG tablet Take 1 tablet by mouth every morning.     simvastatin (ZOCOR) 40 MG tablet Take 1 tablet by mouth daily. Changed by Dr. Scotty Court     No current facility-administered medications for this visit.     Past Surgical History:  Procedure Laterality Date   CARDIAC CATHETERIZATION  2008   CORONARY ANGIOPLASTY WITH STENT PLACEMENT  01/01/2013   "2" (01/01/2013)   INTRAMEDULLARY (IM) NAIL INTERTROCHANTERIC Right 01/13/2021   Procedure: INTRAMEDULLARY (IM) NAIL INTERTROCHANTRIC;  Surgeon: Carole Civil, MD;  Location: [facility];  Service: Orthopedics;  Laterality: Right;  Gamma nail short 125   LUMBAR DISC SURGERY  1974   PERCUTANEOUS CORONARY STENT INTERVENTION (PCI-S) N/A 01/01/2013   Procedure: PERCUTANEOUS CORONARY STENT INTERVENTION (PCI-S);  Surgeon: Peter M  Martinique, MD;  Location: [facility] CATH LAB;  Service: Cardiovascular;  Laterality: N/A;   TONSILLECTOMY AND ADENOIDECTOMY  1947   TRIGGER FINGER RELEASE  1980's   VASECTOMY       Allergies  Allergen Reactions   Amantadines     Hallucinations   Sulfonamide Derivatives       Family History  Problem Relation Age of Onset   Ovarian cancer Mother    Coronary artery disease Father    Alzheimer's disease Father    Diabetes Brother    Diabetes Other    Transient ischemic attack Other    Parkinson's disease Neg Hx    Tremor Neg Hx      Social History Mr. Glasco reports that he quit smoking about 33 years ago. His smoking use included cigarettes. He started smoking about 77 years ago. He has a 88.00 pack-year smoking history. He has never used smokeless tobacco. Mr. Scholes reports no history of alcohol use.   Review of Systems CONSTITUTIONAL: No weight loss, fever, chills, weakness or fatigue.  HEENT: Eyes: No visual loss, blurred vision, double vision or yellow sclerae.No hearing loss, sneezing, congestion, runny nose or sore throat.  SKIN: No rash or itching.  CARDIOVASCULAR: per hpi RESPIRATORY: No shortness of breath, cough or sputum.  GASTROINTESTINAL: No anorexia, nausea, vomiting or diarrhea. No abdominal pain or blood.  GENITOURINARY: No burning on urination, no polyuria NEUROLOGICAL: No headache, dizziness, syncope, paralysis, ataxia, numbness or tingling in the extremities. No change in bowel or bladder control.  MUSCULOSKELETAL: No muscle, back pain, joint pain or stiffness.  LYMPHATICS: No enlarged nodes. No history of splenectomy.  PSYCHIATRIC: No history of depression or anxiety.  ENDOCRINOLOGIC: No reports of sweating, cold or heat intolerance. No polyuria or polydipsia.  [name]   Physical Examination Today's Vitals   09/05/22 1424  BP: (!) 140/72  Pulse: (!) 101  SpO2: 95%  Weight: 128 lb 12.8 oz (58.4 kg)  Height: 5' 10"$  (1.778 m)   Body mass index is 18.48  kg/m.  Gen: resting comfortably, no acute distress HEENT: no scleral icterus, pupils equal round and reactive, no palptable cervical adenopathy,  CV: irreg, no m/r/g no jvd Resp: Clear to auscultation bilaterally GI: abdomen is soft, non-tender, non-distended, normal bowel sounds, no hepatosplenomegaly MSK: extremities are warm, no edema.  Skin: warm, no rash Neuro:  no focal deficits Psych: appropriate affect   Diagnostic Studies 11/2012 Cath   Procedural Findings:   Hemodynamics:   AO 117/52 with a mean of 79 mmHg   LV 116/14 mmHg   Coronary angiography:   Coronary dominance: right   Left mainstem: The left main coronary is normal.   Left anterior descending (LAD): The left anterior  descending artery is diffusely diseased in the proximal vessel up to 75%. It is occluded in the mid vessel. There are left to left collaterals from the ramus intermediate Sharah Finnell. There are also right-to-left collaterals via septal perforators to the distal LAD. The proximal Is well-defined and there appears to be a small tract through the occlusion.   There is a ramus intermediate Trei Schoch which is moderate to large. It has mild irregularities less than 20%.   Left circumflex (LCx): The left circumflex gives rise to a single bifurcating marginal Audelia Knape. The proximal OM has a segmental 60% stenosis.   Right coronary artery (RCA): The right coronary is a large dominant vessel. It is diffusely diseased throughout with stenosis up to 40% in the mid vessel. There is significant plaque burden throughout without significant stenosis.   Left ventriculography: Left ventricular systolic function is normal, LVEF is estimated at 55-65%, there is no significant mitral regurgitation   Final Conclusions:   1. Single vessel occlusive coronary disease with chronic total occlusion of the mid LAD.   2. Normal left ventricular function.   Recommendations: The patient has significant symptoms and ischemia on noninvasive testing  despite 2 antianginal agents. His LAD CTO appears suitable for PCI. The alternative would be to increase his antianginal therapy. I will discuss options with the patient.     01/01/13   Lesion Data:   Vessel: LAD   Percent stenosis (pre): 100%   TIMI-flow (pre): 0   Stent: 2.5 x 38 and 2.75 x 12 mm Promus premier stents   Percent stenosis (post): 0%   TIMI-flow (post): 3   Conclusions:   Successful stenting of the mid LAD for CTO with drug-eluting stents.     11/2012 Echo   LVEF 55-60%, grade II diastolic dysfunction,     11/2015 echo Study Conclusions   - Left ventricle: The cavity size was normal. Wall thickness was   normal. Systolic function was normal. The estimated ejection   fraction was in the range of 55% to 60%. Wall motion was normal;   there were no regional wall motion abnormalities. Doppler   parameters are consistent with abnormal left ventricular   relaxation (grade 1 diastolic dysfunction). - Aortic valve: There was mild regurgitation. Valve area (VTI):   2.49 cm^2. Valve area (Vmax): 2.39 cm^2. Valve area (Vmean): 2.42   cm^2. - Mitral valve: There was mild regurgitation. - Right ventricle: The cavity size was mildly dilated. - Right atrium: The atrium was mildly dilated. - Technically adequate study.   12/2015 MPI Perfusion Summary Defect 1:  There is a small defect of mild severity present in the apical anterior location. The defect is partially reversible. Small, mild intensity, partially reversible apical anterior defect suggesting a minor region of ischemia.  Defect 2:  There is a small defect of mild severity present in the mid inferoseptal and apical inferior location. The defect is non-reversible. Small, mild intensity, mid to apical inferior/inferoseptal defect consistent with soft tissue attenuation.      Overall Study Impression Myocardial perfusion is abnormal. This is a low risk study. Overall left ventricular systolic function was normal. Nuclear  stress EF: 66%      08/2016 PFTs +COPD     01/2019 echo IMPRESSIONS      1. The left ventricle has normal systolic function, with an ejection fraction of 55-60%. The cavity size was normal. There is mildly increased left ventricular wall thickness. Left ventricular diastolic Doppler parameters are consistent with impaired relaxation. No  evidence of left ventricular regional wall motion abnormalities.  2. The right ventricle has normal systolic function. The cavity was normal. There is no increase in right ventricular wall thickness. Right ventricular systolic pressure could not be assessed.  3. The aortic valve is tricuspid. Mild calcification of the aortic valve. Aortic valve regurgitation is mild by color flow Doppler. Mild aortic annular calcification noted.  4. The mitral valve is grossly normal. There is mild mitral annular calcification present.  5. The tricuspid valve is grossly normal.  6. The aortic root is normal in size and structure.      Assessment and Plan  1. CAD   - medical therapy limited by orthostatic hypotension - no symptoms, continue current meds        2. Orthostatic hypotension - secondary to Parkinsons - mild symptoms, encouraged increased consistent oral hydration, increased sodium intake     3. Hyperlipidemia   - he changed back to simva 87m on his own, no side effects on atorva 80 but uncomfortable taking that dose. - LDL at goal, continue current meds  4. Long standing persistent afib - first visit with cardiology since diagnosed during 01/2021 admission with fall and hip fracture - no symptoms. Unclear what happened to the toprol he was on. HRs 100, asymptomatic. Long history of orthostatic hypotension and falls, would not restart av nodal agent at this time - family elected not to start anticoag in 2022 due to frequent falls. Falls have resolved, we discussed eliquis 2.527mbid but they wish to give some thought before committing - EKG shows afib  100    F/u 6 months    JoArnoldo LenisM.D.

## 2022-09-05 NOTE — Patient Instructions (Addendum)
Medication Instructions:  Your physician recommends that you continue on your current medications as directed. Please refer to the Current Medication list given to you today.  Labwork: none  Testing/Procedures: none  Follow-Up: Your physician recommends that you schedule a follow-up appointment in: 6 months  Any Other Special Instructions Will Be Listed Below (If Applicable). Call office if you decide to start eliquis  If you need a refill on your cardiac medications before your next appointment, please call your pharmacy.

## 2022-12-15 DEATH — deceased

## 2023-01-23 ENCOUNTER — Ambulatory Visit: Payer: Medicare Other | Admitting: Neurology

## 2023-03-06 ENCOUNTER — Ambulatory Visit: Payer: Medicare Other | Admitting: Cardiology
